# Patient Record
Sex: Male | Born: 1970 | Race: White | Hispanic: No | Marital: Single | Smoking: Former smoker
Health system: Southern US, Community
[De-identification: ages and names within clinical notes are randomized; demographics above are authoritative.]

## PROBLEM LIST (undated history)

## (undated) DIAGNOSIS — M1990 Unspecified osteoarthritis, unspecified site: Secondary | ICD-10-CM

## (undated) DIAGNOSIS — E119 Type 2 diabetes mellitus without complications: Secondary | ICD-10-CM

## (undated) DIAGNOSIS — F419 Anxiety disorder, unspecified: Secondary | ICD-10-CM

## (undated) DIAGNOSIS — M549 Dorsalgia, unspecified: Secondary | ICD-10-CM

## (undated) HISTORY — DX: Anxiety disorder, unspecified: F41.9

## (undated) HISTORY — DX: Type 2 diabetes mellitus without complications: E11.9

## (undated) HISTORY — DX: Unspecified osteoarthritis, unspecified site: M19.90

## (undated) HISTORY — PX: BACK SURGERY: SHX140

## (undated) HISTORY — DX: Dorsalgia, unspecified: M54.9

---

## 2004-08-09 ENCOUNTER — Ambulatory Visit: Payer: Self-pay | Admitting: Pain Medicine

## 2004-08-22 ENCOUNTER — Ambulatory Visit: Payer: Self-pay | Admitting: Pain Medicine

## 2004-09-10 ENCOUNTER — Ambulatory Visit: Payer: Self-pay | Admitting: Pain Medicine

## 2004-09-24 ENCOUNTER — Ambulatory Visit: Payer: Self-pay | Admitting: Pain Medicine

## 2009-07-26 ENCOUNTER — Emergency Department: Payer: Self-pay | Admitting: Emergency Medicine

## 2009-08-11 ENCOUNTER — Ambulatory Visit: Payer: Self-pay

## 2009-09-05 ENCOUNTER — Ambulatory Visit: Payer: Self-pay | Admitting: Pain Medicine

## 2009-10-11 ENCOUNTER — Ambulatory Visit: Payer: Self-pay | Admitting: Physician Assistant

## 2009-10-31 ENCOUNTER — Emergency Department (HOSPITAL_COMMUNITY): Admission: EM | Admit: 2009-10-31 | Discharge: 2009-10-31 | Payer: Self-pay | Admitting: Emergency Medicine

## 2009-11-06 ENCOUNTER — Ambulatory Visit: Payer: Self-pay | Admitting: Pain Medicine

## 2009-12-18 ENCOUNTER — Ambulatory Visit: Payer: Self-pay | Admitting: Pain Medicine

## 2010-01-24 ENCOUNTER — Emergency Department: Payer: Self-pay | Admitting: Emergency Medicine

## 2010-03-16 ENCOUNTER — Ambulatory Visit: Payer: Self-pay | Admitting: Unknown Physician Specialty

## 2010-03-19 ENCOUNTER — Ambulatory Visit: Payer: Self-pay | Admitting: Unknown Physician Specialty

## 2010-03-22 ENCOUNTER — Ambulatory Visit: Payer: Self-pay | Admitting: Unknown Physician Specialty

## 2011-03-27 ENCOUNTER — Ambulatory Visit: Payer: Self-pay | Admitting: Urology

## 2011-09-12 ENCOUNTER — Ambulatory Visit: Payer: Self-pay | Admitting: Cardiovascular Disease

## 2013-02-26 ENCOUNTER — Ambulatory Visit: Payer: Self-pay | Admitting: Gastroenterology

## 2013-03-02 ENCOUNTER — Ambulatory Visit: Payer: Self-pay | Admitting: Gastroenterology

## 2013-03-15 ENCOUNTER — Ambulatory Visit: Payer: Self-pay | Admitting: Gastroenterology

## 2013-03-16 LAB — PATHOLOGY REPORT

## 2013-09-13 ENCOUNTER — Ambulatory Visit: Payer: Self-pay | Admitting: Family Medicine

## 2014-09-24 IMAGING — CR CERVICAL SPINE - 2-3 VIEW
1 series · 1 of 1 positions shown · non-contrast
Comparison: None.

CLINICAL DATA: Cervical pain.

EXAM:
CERVICAL SPINE - 2-3 VIEW

[ap]
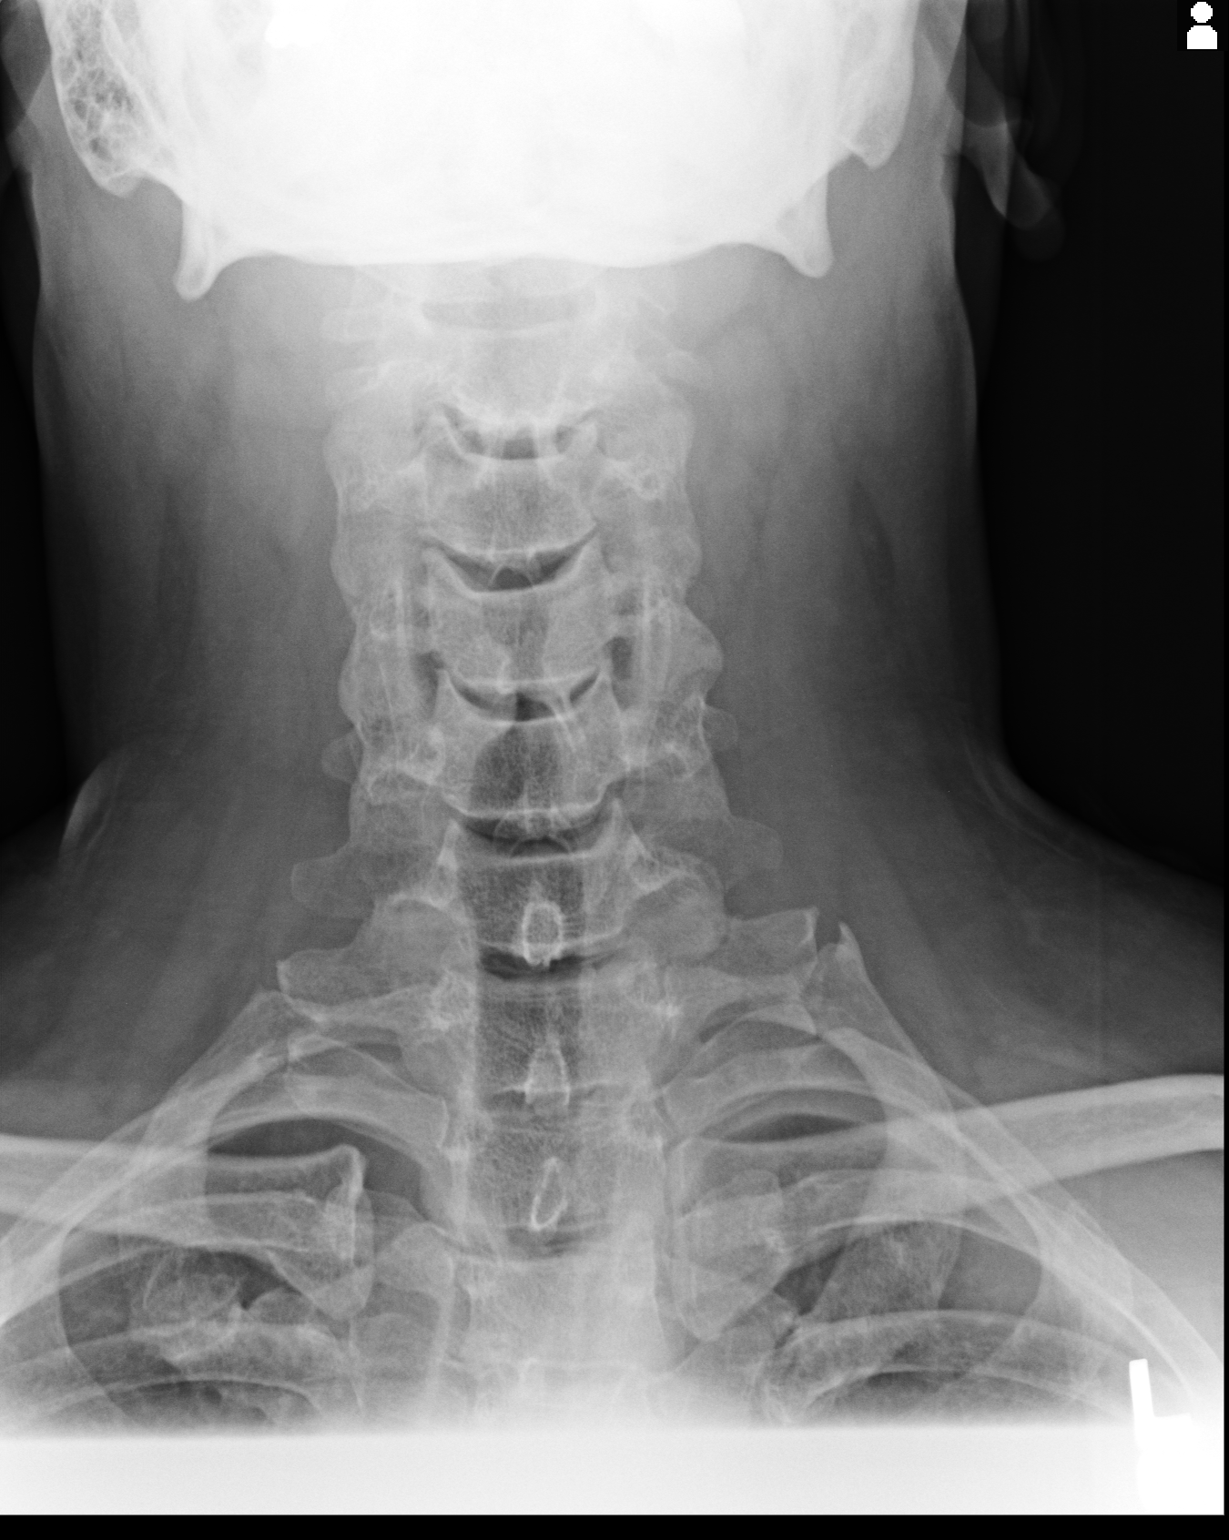

[1 of 1 positions shown; findings below may reference images not displayed]

FINDINGS: The cervical spine is visualized to the level of C6-7..

The vertebral body heights are maintained. The alignment is normal.
The prevertebral soft tissues are normal. There is no acute fracture
or static listhesis. There is degenerative disc disease at C2-3.
IMPRESSION: No acute osseous injury of the cervical spine.

Degenerative disc disease at C2-3.

## 2015-04-24 ENCOUNTER — Ambulatory Visit (INDEPENDENT_AMBULATORY_CARE_PROVIDER_SITE_OTHER): Payer: Self-pay | Admitting: Family Medicine

## 2015-04-24 ENCOUNTER — Encounter: Payer: Self-pay | Admitting: Family Medicine

## 2015-04-24 VITALS — BP 118/77 | HR 76 | Temp 98.1°F | Resp 18 | Ht 68.0 in | Wt 241.8 lb

## 2015-04-24 DIAGNOSIS — R7401 Elevation of levels of liver transaminase levels: Secondary | ICD-10-CM

## 2015-04-24 DIAGNOSIS — E1169 Type 2 diabetes mellitus with other specified complication: Secondary | ICD-10-CM | POA: Insufficient documentation

## 2015-04-24 DIAGNOSIS — A6000 Herpesviral infection of urogenital system, unspecified: Secondary | ICD-10-CM

## 2015-04-24 DIAGNOSIS — R740 Nonspecific elevation of levels of transaminase and lactic acid dehydrogenase [LDH]: Secondary | ICD-10-CM

## 2015-04-24 DIAGNOSIS — E785 Hyperlipidemia, unspecified: Secondary | ICD-10-CM

## 2015-04-24 DIAGNOSIS — E119 Type 2 diabetes mellitus without complications: Secondary | ICD-10-CM

## 2015-04-24 DIAGNOSIS — B001 Herpesviral vesicular dermatitis: Secondary | ICD-10-CM | POA: Insufficient documentation

## 2015-04-24 DIAGNOSIS — A609 Anogenital herpesviral infection, unspecified: Secondary | ICD-10-CM

## 2015-04-24 MED ORDER — LEVEMIR FLEXTOUCH 100 UNIT/ML ~~LOC~~ SOPN: 10.0000 [IU] | PEN_INJECTOR | Freq: Every day | SUBCUTANEOUS | Status: DC

## 2015-04-24 MED ORDER — VALACYCLOVIR HCL 1 G PO TABS: 1000.0000 mg | ORAL_TABLET | Freq: Two times a day (BID) | ORAL | Status: DC

## 2015-04-24 NOTE — Progress Notes (Signed)
Name: Trevor Dennis.   MRN: [id number]    DOB: [birth year]   Date:04/24/2015       Progress Note  Subjective  Chief Complaint  Chief Complaint  Patient presents with  . Follow-up    2 mo  . Diabetes  . Hyperlipidemia  . Hypothyroidism    Diabetes He presents for his follow-up diabetic visit. He has type 2 diabetes mellitus. His disease course has been stable. Hypoglycemia symptoms include nervousness/anxiousness and sweats. Pertinent negatives for diabetic complications include no CVA, heart disease or nephropathy. Risk factors for coronary artery disease include diabetes mellitus. Current diabetic treatment includes intensive insulin program, diet and oral agent (monotherapy). He is following a diabetic and generally healthy diet. His breakfast blood glucose range is generally 110-130 mg/dl. An ACE inhibitor/angiotensin II receptor blocker is not being taken. Eye exam is current.  Hyperlipidemia This is a new diagnosis. Recent lipid tests were reviewed and are high. Exacerbating diseases include diabetes and obesity. Pertinent negatives include no myalgias. He is currently on no antihyperlipidemic treatment. Risk factors for coronary artery disease include dyslipidemia.      Past Medical History  Diagnosis Date  . Anxiety   . Arthritis   . Diabetes mellitus without complication     Past Surgical History  Procedure Laterality Date  . Back surgery  8 years ago    Family History  Problem Relation Age of Onset  . Diabetes Mother   . Liver disease Brother     History   Social History  . Marital Status: Married    Spouse Name: N/A  . Number of Children: N/A  . Years of Education: N/A   Occupational History  . Not on file.   Social History Main Topics  . Smoking status: Current Some Day Smoker -- 1.00 packs/day    Types: Cigarettes  . Smokeless tobacco: Never Used     Comment: social smoker like 1 some days  . Alcohol Use: 0.0 oz/week    0 Standard drinks or  equivalent per week     Comment: Occasional   . Drug Use: No  . Sexual Activity:    Partners: Female   Other Topics Concern  . Not on file   Social History Narrative  . No narrative on file     Current outpatient prescriptions:  .  ALPRAZolam (XANAX) 0.5 MG tablet, Take 0.5 mg by mouth 2 (two) times daily as needed., Disp: , Rfl: 0 .  Blood Glucose Monitoring Suppl (ONE TOUCH ULTRA MINI) W/DEVICE KIT, See admin instructions., Disp: , Rfl: 0 .  esomeprazole (NEXIUM) 40 MG capsule, Take by mouth., Disp: , Rfl:  .  Lancets 30G MISC, , Disp: , Rfl: 12 .  LEVEMIR FLEXTOUCH 100 UNIT/ML Pen, INJECT 10 UNITS SUBQ EVERY NIGHT AT BEDTIME, Disp: , Rfl: 0 .  metFORMIN (GLUCOPHAGE) 1000 MG tablet, Take 1,000 mg by mouth every 12 (twelve) hours., Disp: , Rfl: 2 .  Multiple Vitamin (MULTI VITAMIN DAILY PO), Take by mouth., Disp: , Rfl:  .  NOVOTWIST 32G X 5 MM MISC, See admin instructions., Disp: , Rfl: 0 .  ONE TOUCH ULTRA TEST test strip, USE TO TEST SUGAR 3 TIMES A DAY, Disp: , Rfl: 12  No Known Allergies   Review of Systems  Gastrointestinal: Negative for abdominal pain.  Musculoskeletal: Negative for myalgias, back pain and joint pain.  Psychiatric/Behavioral: The patient is nervous/anxious.       Objective  Filed Vitals:   04/24/15 0840  BP: 118/77  Pulse: 76  Temp: 98.1 F (36.7 C)  TempSrc: Oral  Resp: 18  Height: '5\' 8"'  (1.727 m)  Weight: 241 lb 12.8 oz (109.68 kg)  SpO2: 97%    Physical Exam  Constitutional: He is well-developed, well-nourished, and in no distress.  HENT:  Head: Normocephalic and atraumatic.  Cardiovascular: Normal rate and regular rhythm.   Pulmonary/Chest: Effort normal and breath sounds normal.  Abdominal: Soft. Bowel sounds are normal.  Nursing note and vitals reviewed.      No results found for this or any previous visit (from the past 2160 hour(s)).   Assessment & Plan 1. Diabetes mellitus without complication Recheck Y8X today.  Patient is taking metformin 'as needed' but is injecting the insulin regularly. Dietary and lifestyle therapies have been instituted and fasting BG seems to be improving. - LEVEMIR FLEXTOUCH 100 UNIT/ML Pen; Inject 10 Units into the skin daily at 10 pm.  Dispense: 5 pen; Refill: 0  -HgbA1c  2. HLD (hyperlipidemia)  - Cholesterol, Total  3. Recurrent genital HSV (herpes simplex virus) infection History of exposure to genital herpes with intermittent flareups. Has been on Valtrex in the past and is requesting a refill. Provided prescription for 1000 mg twice daily for acute flareups. - valACYclovir (VALTREX) 1000 MG tablet; Take 1 tablet (1,000 mg total) by mouth 2 (two) times daily.  Dispense: 20 tablet; Refill: 0   Trevor Dennis A. [facility] 04/24/2015 9:06 AM

## 2015-04-25 LAB — HEMOGLOBIN A1C
Est. average glucose Bld gHb Est-mCnc: 157 mg/dL
Hgb A1c MFr Bld: 7.1 % — ABNORMAL HIGH (ref 4.8–5.6)

## 2015-04-25 LAB — CHOLESTEROL, TOTAL: Cholesterol, Total: 172 mg/dL (ref 100–199)

## 2015-04-28 ENCOUNTER — Other Ambulatory Visit: Payer: Self-pay | Admitting: Family Medicine

## 2015-04-28 MED ORDER — NOVOTWIST 32G X 5 MM MISC: 10.0000 [IU] | Status: DC

## 2015-04-28 NOTE — Telephone Encounter (Signed)
Trevor Dennis has been refilled and sent to [facility] [address] [address], Trevor has appointment scheduled for 07/25/2015

## 2015-04-29 LAB — LIPID PANEL W/O CHOL/HDL RATIO
CHOLESTEROL TOTAL: 179 mg/dL (ref 100–199)
HDL: 48 mg/dL (ref >39)
LDL CALC: 100 mg/dL — AB (ref 0–99)
TRIGLYCERIDES: 156 mg/dL — AB (ref 0–149)
VLDL CHOLESTEROL CAL: 31 mg/dL (ref 5–40)

## 2015-05-03 DIAGNOSIS — K760 Fatty (change of) liver, not elsewhere classified: Secondary | ICD-10-CM | POA: Insufficient documentation

## 2015-05-03 NOTE — Addendum Note (Signed)
Addended bySherryll Burger: Yanky Vanderburg A A on: 05/03/2015 06:56 PM   Modules accepted: Orders, SmartSet

## 2015-05-20 LAB — SPECIMEN STATUS REPORT

## 2015-07-17 ENCOUNTER — Telehealth: Payer: Self-pay | Admitting: Family Medicine

## 2015-07-17 DIAGNOSIS — E119 Type 2 diabetes mellitus without complications: Secondary | ICD-10-CM

## 2015-07-17 NOTE — Telephone Encounter (Signed)
Patient only have one dosage of Levimer and is needing a prescription to be called into [facility]. He does have upcoming appointment for 07-25-15

## 2015-07-18 MED ORDER — LEVEMIR FLEXTOUCH 100 UNIT/ML ~~LOC~~ SOPN: 10.0000 [IU] | PEN_INJECTOR | Freq: Every day | SUBCUTANEOUS | Status: DC

## 2015-07-18 NOTE — Telephone Encounter (Signed)
Medication has been refilled and [facility] 

## 2015-07-25 ENCOUNTER — Ambulatory Visit: Payer: Self-pay | Admitting: Family Medicine

## 2015-07-27 ENCOUNTER — Ambulatory Visit (INDEPENDENT_AMBULATORY_CARE_PROVIDER_SITE_OTHER): Payer: Self-pay | Admitting: Family Medicine

## 2015-07-27 ENCOUNTER — Encounter: Payer: Self-pay | Admitting: Family Medicine

## 2015-07-27 VITALS — BP 118/70 | HR 83 | Temp 98.6°F | Resp 18 | Ht 68.0 in | Wt 241.9 lb

## 2015-07-27 DIAGNOSIS — E785 Hyperlipidemia, unspecified: Secondary | ICD-10-CM

## 2015-07-27 DIAGNOSIS — Z794 Long term (current) use of insulin: Secondary | ICD-10-CM

## 2015-07-27 DIAGNOSIS — M549 Dorsalgia, unspecified: Secondary | ICD-10-CM

## 2015-07-27 DIAGNOSIS — E119 Type 2 diabetes mellitus without complications: Secondary | ICD-10-CM

## 2015-07-27 DIAGNOSIS — L989 Disorder of the skin and subcutaneous tissue, unspecified: Secondary | ICD-10-CM | POA: Insufficient documentation

## 2015-07-27 DIAGNOSIS — M545 Low back pain, unspecified: Secondary | ICD-10-CM | POA: Insufficient documentation

## 2015-07-27 DIAGNOSIS — R748 Abnormal levels of other serum enzymes: Secondary | ICD-10-CM

## 2015-07-27 HISTORY — DX: Dorsalgia, unspecified: M54.9

## 2015-07-27 LAB — POCT CBG (FASTING - GLUCOSE)-MANUAL ENTRY: GLUCOSE FASTING, POC: 136 mg/dL — AB (ref 70–99)

## 2015-07-27 MED ORDER — METFORMIN HCL 1000 MG PO TABS: 1000.0000 mg | ORAL_TABLET | Freq: Two times a day (BID) | ORAL | Status: DC

## 2015-07-27 NOTE — Progress Notes (Signed)
Name: Trevor Dennis.   MRN: [id number]    DOB: Feb 25, 1971   Date:07/27/2015       Progress Note  Subjective  Chief Complaint  Chief Complaint  Patient presents with  . Follow-up    3 mo  . Diabetes  . Hyperlipidemia  . Gastrophageal Reflux  . Anxiety    Diabetes He presents for his follow-up diabetic visit. He has type 2 diabetes mellitus. His disease course has been stable. Pertinent negatives for diabetes include no chest pain. Current diabetic treatment includes oral agent (monotherapy) and intensive insulin program (Metformin only 'as needed'). His breakfast blood glucose range is generally 130-140 mg/dl. An ACE inhibitor/angiotensin II receptor blocker is not being taken.  Hyperlipidemia The problem is controlled. Recent lipid tests were reviewed and are high. Exacerbating diseases include diabetes. Pertinent negatives include no chest pain or shortness of breath. He is currently on no antihyperlipidemic treatment (elevated Liver enzymes.).   Past Medical History  Diagnosis Date  . Anxiety   . Arthritis   . Diabetes mellitus without complication [facility])     Past Surgical History  Procedure Laterality Date  . Back surgery  8 years ago    Family History  Problem Relation Age of Onset  . Diabetes Mother   . Liver disease Brother     Social History   Social History  . Marital Status: Married    Spouse Name: N/A  . Number of Children: N/A  . Years of Education: N/A   Occupational History  . Not on file.   Social History Main Topics  . Smoking status: Current Some Day Smoker -- 1.00 packs/day    Types: Cigarettes  . Smokeless tobacco: Never Used     Comment: social smoker like 1 some days  . Alcohol Use: 0.0 oz/week    0 Standard drinks or equivalent per week     Comment: Occasional   . Drug Use: No  . Sexual Activity:    Partners: Female   Other Topics Concern  . Not on file   Social History Narrative    Current outpatient prescriptions:  .   ALPRAZolam (XANAX) 0.5 MG tablet, Take 0.5 mg by mouth 2 (two) times daily as needed., Disp: , Rfl: 0 .  Blood Glucose Monitoring Suppl (ONE TOUCH ULTRA MINI) W/DEVICE KIT, See admin instructions., Disp: , Rfl: 0 .  esomeprazole (NEXIUM) 40 MG capsule, Take by mouth., Disp: , Rfl:  .  Lancets 30G MISC, , Disp: , Rfl: 12 .  LEVEMIR FLEXTOUCH 100 UNIT/ML Pen, Inject 10 Units into the skin daily at 10 pm., Disp: 5 pen, Rfl: 0 .  metFORMIN (GLUCOPHAGE) 1000 MG tablet, Take 1,000 mg by mouth every 12 (twelve) hours., Disp: , Rfl: 2 .  Multiple Vitamin (MULTI VITAMIN DAILY PO), Take by mouth., Disp: , Rfl:  .  NOVOTWIST 32G X 5 MM MISC, Inject 10 Units into the skin See admin instructions., Disp: 100 each, Rfl: 0 .  ONE TOUCH ULTRA TEST test strip, USE TO TEST SUGAR 3 TIMES A DAY, Disp: , Rfl: 12 .  valACYclovir (VALTREX) 1000 MG tablet, Take 1 tablet (1,000 mg total) by mouth 2 (two) times daily., Disp: 20 tablet, Rfl: 0  No Known Allergies  Review of Systems  Constitutional: Positive for malaise/fatigue. Negative for fever and chills.  Respiratory: Negative for shortness of breath.   Cardiovascular: Negative for chest pain.  Gastrointestinal: Negative for abdominal pain, diarrhea and blood in stool.    Objective  Filed Vitals:   07/27/15 0833  BP: 118/70  Pulse: 83  Temp: 98.6 F (37 C)  TempSrc: Oral  Resp: 18  Height: _0  (1.727 m)  Weight: 241 lb 14.4 oz (109.725 kg)  SpO2: 96%    Physical Exam  Constitutional: He is oriented to person, place, and time and well-developed, well-nourished, and in no distress.  HENT:  Head: Normocephalic and atraumatic.  Cardiovascular: Normal rate, regular rhythm and normal heart sounds.   No murmur heard. Pulmonary/Chest: Effort normal and breath sounds normal. He has no wheezes.  Abdominal: Soft. Bowel sounds are normal. There is no tenderness.  Neurological: He is alert and oriented to person, place, and time.  Skin: Skin is warm and  dry.     Raised, flesh-colored smooth lesion over the right lower back.  Nursing note and vitals reviewed.   Assessment & Plan  1. Controlled type 2 diabetes mellitus with insulin therapy ([place]) Advised patient to take metformin regularly as prescribed. Obtain A1c and consider adjustment of insulin dosage. - metFORMIN (GLUCOPHAGE) 1000 MG tablet; Take 1 tablet (1,000 mg total) by mouth 2 (two) times daily with a meal.  Dispense: 180 tablet; Refill: 0 - POCT CBG (Fasting - Glucose) - Urine Microalbumin w/creat. ratio - HgB A1c  2. Elevated liver enzymes Repeat liver enzymes and follow-up. - Basic Metabolic Panel (BMET) - Hepatic function panel  3. Skin lesion of back  - Ambulatory referral to Dermatology  4. HLD (hyperlipidemia)  - Lipid Profile  Carl Butner Asad [facility] 07/27/2015 8:54 AM

## 2015-07-28 LAB — BASIC METABOLIC PANEL
BUN/Creatinine Ratio: 23 — ABNORMAL HIGH (ref 9–20)
BUN: 22 mg/dL (ref 6–24)
CO2: 25 mmol/L (ref 18–29)
Calcium: 9.9 mg/dL (ref 8.7–10.2)
Chloride: 100 mmol/L (ref 97–108)
Creatinine, Ser: 0.94 mg/dL (ref 0.76–1.27)
GFR calc Af Amer: 115 mL/min/{1.73_m2} (ref >59)
GFR calc non Af Amer: 100 mL/min/{1.73_m2} (ref >59)
Glucose: 122 mg/dL — ABNORMAL HIGH (ref 65–99)
Potassium: 5.2 mmol/L (ref 3.5–5.2)
Sodium: 140 mmol/L (ref 134–144)

## 2015-07-28 LAB — HEPATIC FUNCTION PANEL
ALT: 143 IU/L — AB (ref 0–44)
AST: 90 IU/L — AB (ref 0–40)
Albumin: 4.9 g/dL (ref 3.5–5.5)
Alkaline Phosphatase: 57 IU/L (ref 39–117)
BILIRUBIN TOTAL: 0.7 mg/dL (ref 0.0–1.2)
Bilirubin, Direct: 0.2 mg/dL (ref 0.00–0.40)
Total Protein: 7.8 g/dL (ref 6.0–8.5)

## 2015-07-28 LAB — MICROALBUMIN / CREATININE URINE RATIO
Creatinine, Urine: 159.6 mg/dL
MICROALB/CREAT RATIO: 8.2 mg/g creat (ref 0.0–30.0)
Microalbumin, Urine: 13.1 ug/mL

## 2015-07-28 LAB — LIPID PANEL
CHOL/HDL RATIO: 3.5 ratio (ref 0.0–5.0)
CHOLESTEROL TOTAL: 188 mg/dL (ref 100–199)
HDL: 54 mg/dL (ref >39)
LDL CALC: 118 mg/dL — AB (ref 0–99)
TRIGLYCERIDES: 82 mg/dL (ref 0–149)
VLDL Cholesterol Cal: 16 mg/dL (ref 5–40)

## 2015-07-28 LAB — HEMOGLOBIN A1C
Est. average glucose Bld gHb Est-mCnc: 157 mg/dL
HEMOGLOBIN A1C: 7.1 % — AB (ref 4.8–5.6)

## 2015-09-05 ENCOUNTER — Telehealth: Payer: Self-pay | Admitting: Family Medicine

## 2015-09-05 NOTE — Telephone Encounter (Signed)
Spoke with Misty StanleyLisa from [facility] and notified her that patient has not had this medication refilled since 02/16/2015 and he would need to schedule an appointment with Dr. Sherryll BurgerShah before this refilling this medication

## 2015-09-05 NOTE — Telephone Encounter (Signed)
Pt would like refill on Alprazolam.  °

## 2015-09-05 NOTE — Telephone Encounter (Signed)
Patient will need an office visit appointment to discuss medication refills

## 2015-09-05 NOTE — Telephone Encounter (Signed)
Routed to Dr. Sherryll BurgerShah for approval, patient has been notified that he needs to schedule an appointment due to medication has not been refilled since 02/16/2015.

## 2015-09-05 NOTE — Telephone Encounter (Signed)
[facility]ill on Alprazolam .05mg  #60 last filled on 02-16-15

## 2015-09-22 ENCOUNTER — Telehealth: Payer: Self-pay

## 2015-09-22 MED ORDER — NOVOTWIST 32G X 5 MM MISC: 10.0000 [IU] | Status: DC

## 2015-09-22 NOTE — Telephone Encounter (Signed)
[facility] Needle has been refilled and [facility]

## 2015-10-16 ENCOUNTER — Other Ambulatory Visit: Payer: Self-pay | Admitting: Family Medicine

## 2015-10-27 ENCOUNTER — Ambulatory Visit: Payer: Self-pay | Admitting: Family Medicine

## 2015-11-01 ENCOUNTER — Other Ambulatory Visit: Payer: Self-pay | Admitting: Family Medicine

## 2015-11-20 ENCOUNTER — Ambulatory Visit: Payer: Self-pay | Admitting: Family Medicine

## 2015-11-22 ENCOUNTER — Encounter: Payer: Self-pay | Admitting: Family Medicine

## 2015-11-22 ENCOUNTER — Ambulatory Visit (INDEPENDENT_AMBULATORY_CARE_PROVIDER_SITE_OTHER): Payer: BLUE CROSS/BLUE SHIELD | Admitting: Family Medicine

## 2015-11-22 VITALS — BP 118/68 | HR 82 | Temp 98.4°F | Resp 19 | Ht 68.0 in | Wt 249.6 lb

## 2015-11-22 DIAGNOSIS — E119 Type 2 diabetes mellitus without complications: Secondary | ICD-10-CM

## 2015-11-22 DIAGNOSIS — L309 Dermatitis, unspecified: Secondary | ICD-10-CM | POA: Insufficient documentation

## 2015-11-22 DIAGNOSIS — R7989 Other specified abnormal findings of blood chemistry: Secondary | ICD-10-CM

## 2015-11-22 DIAGNOSIS — E785 Hyperlipidemia, unspecified: Secondary | ICD-10-CM

## 2015-11-22 DIAGNOSIS — Z794 Long term (current) use of insulin: Secondary | ICD-10-CM | POA: Diagnosis not present

## 2015-11-22 DIAGNOSIS — R748 Abnormal levels of other serum enzymes: Secondary | ICD-10-CM

## 2015-11-22 DIAGNOSIS — E291 Testicular hypofunction: Secondary | ICD-10-CM

## 2015-11-22 LAB — POCT CBG (FASTING - GLUCOSE)-MANUAL ENTRY: GLUCOSE FASTING, POC: 131 mg/dL — AB (ref 70–99)

## 2015-11-22 LAB — POCT GLYCOSYLATED HEMOGLOBIN (HGB A1C): Hemoglobin A1C: 6.8

## 2015-11-22 MED ORDER — KETOCONAZOLE 2 % EX CREA: 1.0000 "application " | TOPICAL_CREAM | Freq: Every day | CUTANEOUS | Status: DC

## 2015-11-22 NOTE — Progress Notes (Signed)
Name: Trevor Dennis.   MRN: [id number]    DOB: 01-20-1971   Date:11/22/2015       Progress Note  Subjective  Chief Complaint  Chief Complaint  Patient presents with  . Follow-up    3 mo  . Diabetes  . Hyperlipidemia  . Medication Refill    Xanax    Diabetes He presents for his follow-up diabetic visit. He has type 2 diabetes mellitus. His disease course has been stable. There are no hypoglycemic associated symptoms. Pertinent negatives for diabetes include no chest pain, no fatigue, no foot paresthesias, no polydipsia and no polyuria. Symptoms are stable. Current diabetic treatment includes oral agent (monotherapy) and insulin injections. His breakfast blood glucose range is generally 130-140 mg/dl.  Hyperlipidemia The problem is uncontrolled. Recent lipid tests were reviewed and are high (Elevated LDL). Exacerbating diseases include diabetes and obesity. Pertinent negatives include no chest pain. He is currently on no antihyperlipidemic treatment (Not on statin therapy 2/2 elevated liver enzymes.).   Low Testosterone Pt. With reported hx of low testosterone in the past (6-7 yrs ago). He was on testosterone pellets treatment and used to see a Dealer in [place], [place]. He has been off any testosterone replacement therapy due to lack of insurance. He wants his Testosterone levels repeated.   Past Medical History  Diagnosis Date  . Anxiety   . Arthritis   . Diabetes mellitus without complication [facility])     Past Surgical History  Procedure Laterality Date  . Back surgery  8 years ago    Family History  Problem Relation Age of Onset  . Diabetes Mother   . Liver disease Brother     Social History   Social History  . Marital Status: Married    Spouse Name: N/A  . Number of Children: N/A  . Years of Education: N/A   Occupational History  . Not on file.   Social History Main Topics  . Smoking status: Current Some Day Smoker -- 1.00 packs/day    Types: Cigarettes  .  Smokeless tobacco: Never Used     Comment: social smoker like 1 some days  . Alcohol Use: 0.0 oz/week    0 Standard drinks or equivalent per week     Comment: Occasional   . Drug Use: No  . Sexual Activity:    Partners: Female   Other Topics Concern  . Not on file   Social History Narrative    Current outpatient prescriptions:  .  ALPRAZolam (XANAX) 0.5 MG tablet, Take 0.5 mg by mouth 2 (two) times daily as needed., Disp: , Rfl: 0 .  Blood Glucose Monitoring Suppl (ONE TOUCH ULTRA MINI) W/DEVICE KIT, See admin instructions., Disp: , Rfl: 0 .  esomeprazole (NEXIUM) 40 MG capsule, Take by mouth., Disp: , Rfl:  .  Lancets 30G MISC, , Disp: , Rfl: 12 .  LEVEMIR FLEXTOUCH 100 UNIT/ML Pen, INJECT 10 UNITS INTO THE SKIN DAILY AT 10 PM, Disp: 1 pen, Rfl: 3 .  metFORMIN (GLUCOPHAGE) 1000 MG tablet, TAKE 1 TABLET (1,000 MG TOTAL) BY MOUTH 2 (TWO) TIMES DAILY WITH A MEAL., Disp: 180 tablet, Rfl: 0 .  Multiple Vitamin (MULTI VITAMIN DAILY PO), Take by mouth., Disp: , Rfl:  .  NOVOTWIST 32G X 5 MM MISC, Inject 10 Units into the skin See admin instructions., Disp: 100 each, Rfl: 0 .  ONE TOUCH ULTRA TEST test strip, USE TO TEST SUGAR 3 TIMES A DAY, Disp: , Rfl: 12 .  valACYclovir (  VALTREX) 1000 MG tablet, Take 1 tablet (1,000 mg total) by mouth 2 (two) times daily., Disp: 20 tablet, Rfl: 0  No Known Allergies   Review of Systems  Constitutional: Negative for fever, chills, malaise/fatigue and fatigue.  Cardiovascular: Negative for chest pain and palpitations.  Gastrointestinal: Negative for nausea, vomiting and abdominal pain.  Endo/Heme/Allergies: Negative for polydipsia.     Objective  Filed Vitals:   11/22/15 0854  BP: 118/68  Pulse: 82  Temp: 98.4 F (36.9 C)  TempSrc: Oral  Resp: 19  Height: _0  (1.727 m)  Weight: 249 lb 9.6 oz (113.218 kg)  SpO2: 95%    Physical Exam  Constitutional: He is oriented to person, place, and time and well-developed, well-nourished, and in  no distress.  HENT:  Head: Normocephalic and atraumatic.  Cardiovascular: Normal rate and regular rhythm.   Pulmonary/Chest: Effort normal and breath sounds normal.  Abdominal: Soft. Bowel sounds are normal.  Neurological: He is alert and oriented to person, place, and time.  Skin: Rash noted. Rash is maculopapular and vesicular.     Erythematous, maculo-papular rash on the right lower abdomen at the skin fold, closed vesicles also visualized.  Nursing note and vitals reviewed.     Assessment & Plan  1. Controlled type 2 diabetes mellitus with insulin therapy (HCC) A1c now below 7%, at goal. Continue on metformin and basal insulin. - POCT HgB A1C - POCT CBG (Fasting - Glucose)  2. Elevated liver enzymes Repeat levels today. If persistently elevated, will need further workup - Comprehensive Metabolic Panel (CMET)  3. HLD (hyperlipidemia)  - Lipid Profile  4. Dermatitis Likely fungal in etiology. We'll start on topical antifungal. - ketoconazole (NIZORAL) 2 % cream; Apply 1 application topically daily.  Dispense: 30 g; Refill: 0  5. Low testosterone In the past, has been on replacement therapy. Obtain levels today. - Testosterone   Isaly Fasching Asad A[facility] 11/22/2015 9:02 AM

## 2015-11-23 ENCOUNTER — Other Ambulatory Visit: Payer: Self-pay

## 2015-11-23 LAB — COMPREHENSIVE METABOLIC PANEL
ALBUMIN: 4.7 g/dL (ref 3.5–5.5)
ALK PHOS: 56 IU/L (ref 39–117)
ALT: 135 IU/L — ABNORMAL HIGH (ref 0–44)
AST: 80 IU/L — ABNORMAL HIGH (ref 0–40)
Albumin/Globulin Ratio: 1.6 (ref 1.1–2.5)
BUN / CREAT RATIO: 15 (ref 9–20)
BUN: 14 mg/dL (ref 6–24)
Bilirubin Total: 0.6 mg/dL (ref 0.0–1.2)
CO2: 23 mmol/L (ref 18–29)
CREATININE: 0.92 mg/dL (ref 0.76–1.27)
Calcium: 9.9 mg/dL (ref 8.7–10.2)
Chloride: 101 mmol/L (ref 96–106)
GFR calc non Af Amer: 102 mL/min/{1.73_m2} (ref >59)
GFR, EST AFRICAN AMERICAN: 117 mL/min/{1.73_m2} (ref >59)
GLOBULIN, TOTAL: 2.9 g/dL (ref 1.5–4.5)
GLUCOSE: 112 mg/dL — AB (ref 65–99)
Potassium: 5.5 mmol/L — ABNORMAL HIGH (ref 3.5–5.2)
SODIUM: 141 mmol/L (ref 134–144)
TOTAL PROTEIN: 7.6 g/dL (ref 6.0–8.5)

## 2015-11-23 LAB — TESTOSTERONE: TESTOSTERONE: 188 ng/dL — AB (ref 348–1197)

## 2015-11-23 LAB — LIPID PANEL
CHOL/HDL RATIO: 3.2 ratio (ref 0.0–5.0)
CHOLESTEROL TOTAL: 185 mg/dL (ref 100–199)
HDL: 58 mg/dL (ref >39)
LDL CALC: 106 mg/dL — AB (ref 0–99)
Triglycerides: 106 mg/dL (ref 0–149)
VLDL CHOLESTEROL CAL: 21 mg/dL (ref 5–40)

## 2015-11-23 NOTE — Telephone Encounter (Signed)
Routed to Dr. Shah for approval 

## 2015-12-05 ENCOUNTER — Telehealth: Payer: Self-pay | Admitting: Family Medicine

## 2015-12-05 NOTE — Telephone Encounter (Signed)
Please schedule patient for an appointment to discuss his symptoms and medication refill

## 2015-12-05 NOTE — Telephone Encounter (Signed)
Patient is completely out of Xanex. He was last seen on 11-22-15. Please refill

## 2015-12-05 NOTE — Telephone Encounter (Signed)
Routed to Dr. Shah for approval 

## 2016-01-01 ENCOUNTER — Encounter: Payer: Self-pay | Admitting: Family Medicine

## 2016-01-01 ENCOUNTER — Ambulatory Visit (INDEPENDENT_AMBULATORY_CARE_PROVIDER_SITE_OTHER): Payer: BLUE CROSS/BLUE SHIELD | Admitting: Family Medicine

## 2016-01-01 VITALS — BP 128/81 | HR 107 | Temp 98.3°F | Resp 18 | Ht 68.0 in | Wt 243.7 lb

## 2016-01-01 DIAGNOSIS — F419 Anxiety disorder, unspecified: Secondary | ICD-10-CM

## 2016-01-01 DIAGNOSIS — F430 Acute stress reaction: Principal | ICD-10-CM

## 2016-01-01 DIAGNOSIS — F411 Generalized anxiety disorder: Secondary | ICD-10-CM

## 2016-01-01 MED ORDER — ALPRAZOLAM 0.5 MG PO TABS: 0.5000 mg | ORAL_TABLET | Freq: Two times a day (BID) | ORAL | Status: DC | PRN

## 2016-01-01 NOTE — Progress Notes (Signed)
Name: Trevor Dennis.   MRN: [id number]    DOB: Sep 04, 1971   Date:01/01/2016       Progress Note  Subjective  Chief Complaint  Chief Complaint  Patient presents with  . Medication Refill    xanax 5 mg     HPI  Anxiety: Battling multiple stressors at this time. His dad recently had a stroke and he's taking care of him. Also found out his wife had been cheating on him. He describes himself as being on an 'emotional roller-coaster'. Has been taking Xanax 0.35m twice daily as needed and ran out. Here or request refill.  Past Medical History  Diagnosis Date  . Anxiety   . Arthritis   . Diabetes mellitus without complication ([facility]     Past Surgical History  Procedure Laterality Date  . Back surgery  8 years ago    Family History  Problem Relation Age of Onset  . Diabetes Mother   . Liver disease Brother     Social History   Social History  . Marital Status: Married    Spouse Name: N/A  . Number of Children: N/A  . Years of Education: N/A   Occupational History  . Not on file.   Social History Main Topics  . Smoking status: Current Some Day Smoker -- 1.00 packs/day    Types: Cigarettes  . Smokeless tobacco: Never Used     Comment: social smoker like 1 some days  . Alcohol Use: 0.0 oz/week    0 Standard drinks or equivalent per week     Comment: Occasional   . Drug Use: No  . Sexual Activity:    Partners: Female   Other Topics Concern  . Not on file   Social History Narrative     Current outpatient prescriptions:  .  ALPRAZolam (XANAX) 0.5 MG tablet, Take 0.5 mg by mouth 2 (two) times daily as needed., Disp: , Rfl: 0 .  Blood Glucose Monitoring Suppl (ONE TOUCH ULTRA MINI) W/DEVICE KIT, See admin instructions., Disp: , Rfl: 0 .  esomeprazole (NEXIUM) 40 MG capsule, Take by mouth., Disp: , Rfl:  .  ketoconazole (NIZORAL) 2 % cream, Apply 1 application topically daily., Disp: 30 g, Rfl: 0 .  Lancets 30G MISC, , Disp: , Rfl: 12 .  LEVEMIR FLEXTOUCH 100  UNIT/ML Pen, INJECT 10 UNITS INTO THE SKIN DAILY AT 10 PM, Disp: 1 pen, Rfl: 3 .  metFORMIN (GLUCOPHAGE) 1000 MG tablet, TAKE 1 TABLET (1,000 MG TOTAL) BY MOUTH 2 (TWO) TIMES DAILY WITH A MEAL., Disp: 180 tablet, Rfl: 0 .  Multiple Vitamin (MULTI VITAMIN DAILY PO), Take by mouth., Disp: , Rfl:  .  NOVOTWIST 32G X 5 MM MISC, Inject 10 Units into the skin See admin instructions., Disp: 100 each, Rfl: 0 .  ONE TOUCH ULTRA TEST test strip, USE TO TEST SUGAR 3 TIMES A DAY, Disp: , Rfl: 12 .  valACYclovir (VALTREX) 1000 MG tablet, Take 1 tablet (1,000 mg total) by mouth 2 (two) times daily., Disp: 20 tablet, Rfl: 0  No Known Allergies   Review of Systems  Psychiatric/Behavioral: The patient is nervous/anxious.     Objective  Filed Vitals:   01/01/16 1431  BP: 128/81  Pulse: 107  Temp: 98.3 F (36.8 C)  TempSrc: Oral  Resp: 18  Height: '5\' 8"'  (1.727 m)  Weight: 243 lb 11.2 oz (110.542 kg)  SpO2: 96%    Physical Exam  Constitutional: He is oriented to person, place, and time  and well-developed, well-nourished, and in no distress.  HENT:  Head: Normocephalic and atraumatic.  Neurological: He is alert and oriented to person, place, and time.  Psychiatric: Memory, affect and judgment normal. He exhibits a depressed mood.  Tearful during the interview and exam while describing his marital situation  Nursing note and vitals reviewed.     Assessment & Plan  1. Anxiety in acute stress reaction  Refill provided for alprazolam, patient advised to take medication as directed and return in one month for follow-up.  - ALPRAZolam (XANAX) 0.5 MG tablet; Take 1 tablet (0.5 mg total) by mouth 2 (two) times daily as needed.  Dispense: 60 tablet; Refill: 0   Eastin Swing Asad A[facility] 01/01/2016 2:42 PM

## 2016-01-24 ENCOUNTER — Encounter: Payer: Self-pay | Admitting: Family Medicine

## 2016-01-24 ENCOUNTER — Ambulatory Visit (INDEPENDENT_AMBULATORY_CARE_PROVIDER_SITE_OTHER): Payer: BLUE CROSS/BLUE SHIELD | Admitting: Family Medicine

## 2016-01-24 VITALS — BP 130/74 | HR 84 | Temp 98.8°F | Resp 18 | Ht 68.0 in | Wt 239.9 lb

## 2016-01-24 DIAGNOSIS — F411 Generalized anxiety disorder: Secondary | ICD-10-CM

## 2016-01-24 DIAGNOSIS — F419 Anxiety disorder, unspecified: Secondary | ICD-10-CM | POA: Diagnosis not present

## 2016-01-24 DIAGNOSIS — F430 Acute stress reaction: Principal | ICD-10-CM

## 2016-01-24 MED ORDER — ALPRAZOLAM 0.5 MG PO TABS
0.5000 mg | ORAL_TABLET | Freq: Two times a day (BID) | ORAL | Status: DC | PRN
Start: 2016-01-24 — End: 2016-03-21

## 2016-01-24 MED ORDER — ALPRAZOLAM 0.5 MG PO TABS: 0.5000 mg | ORAL_TABLET | Freq: Two times a day (BID) | ORAL | Status: DC | PRN

## 2016-01-24 NOTE — Progress Notes (Signed)
Name: Trevor Dennis.   MRN: [id number]    DOB: 05-Mar-1971   Date:01/24/2016       Progress Note  Subjective  Chief Complaint  Chief Complaint  Patient presents with  . Follow-up    1 mo    HPI  Anxiety: Pt. Is here for refill of Alprazolam 0.5 mg twice daily as needed. His wife recently was found to be cheating on him and has since moved out. He is trying to move his father (who recently had a stroke) into an apartment. Alprazolam has helped him deal with the stress and anxiety in last few weeks. No side effects.   Past Medical History  Diagnosis Date  . Anxiety   . Arthritis   . Diabetes mellitus without complication [facility])     Past Surgical History  Procedure Laterality Date  . Back surgery  8 years ago    Family History  Problem Relation Age of Onset  . Diabetes Mother   . Liver disease Brother     Social History   Social History  . Marital Status: Married    Spouse Name: N/A  . Number of Children: N/A  . Years of Education: N/A   Occupational History  . Not on file.   Social History Main Topics  . Smoking status: Current Some Day Smoker -- 1.00 packs/day    Types: Cigarettes  . Smokeless tobacco: Never Used     Comment: social smoker like 1 some days  . Alcohol Use: 0.0 oz/week    0 Standard drinks or equivalent per week     Comment: Occasional   . Drug Use: No  . Sexual Activity:    Partners: Female   Other Topics Concern  . Not on file   Social History Narrative     Current outpatient prescriptions:  .  ALPRAZolam (XANAX) 0.5 MG tablet, Take 1 tablet (0.5 mg total) by mouth 2 (two) times daily as needed., Disp: 60 tablet, Rfl: 0 .  Blood Glucose Monitoring Suppl (ONE TOUCH ULTRA MINI) W/DEVICE KIT, See admin instructions., Disp: , Rfl: 0 .  esomeprazole (NEXIUM) 40 MG capsule, Take by mouth., Disp: , Rfl:  .  ketoconazole (NIZORAL) 2 % cream, Apply 1 application topically daily., Disp: 30 g, Rfl: 0 .  Lancets 30G MISC, , Disp: , Rfl:  12 .  LEVEMIR FLEXTOUCH 100 UNIT/ML Pen, INJECT 10 UNITS INTO THE SKIN DAILY AT 10 PM, Disp: 1 pen, Rfl: 3 .  metFORMIN (GLUCOPHAGE) 1000 MG tablet, TAKE 1 TABLET (1,000 MG TOTAL) BY MOUTH 2 (TWO) TIMES DAILY WITH A MEAL., Disp: 180 tablet, Rfl: 0 .  Multiple Vitamin (MULTI VITAMIN DAILY PO), Take by mouth., Disp: , Rfl:  .  NOVOTWIST 32G X 5 MM MISC, Inject 10 Units into the skin See admin instructions., Disp: 100 each, Rfl: 0 .  ONE TOUCH ULTRA TEST test strip, USE TO TEST SUGAR 3 TIMES A DAY, Disp: , Rfl: 12 .  valACYclovir (VALTREX) 1000 MG tablet, Take 1 tablet (1,000 mg total) by mouth 2 (two) times daily., Disp: 20 tablet, Rfl: 0  No Known Allergies   Review of Systems  Psychiatric/Behavioral: Negative for depression. The patient is nervous/anxious. The patient does not have insomnia.       Objective  Filed Vitals:   01/24/16 1449  BP: 130/74  Pulse: 84  Temp: 98.8 F (37.1 C)  TempSrc: Oral  Resp: 18  Height: '5\' 8"'  (1.727 m)  Weight: 239 lb 14.4 oz (  108.818 kg)  SpO2: 96%    Physical Exam  Constitutional: He is oriented to person, place, and time and well-developed, well-nourished, and in no distress.  Neurological: He is alert and oriented to person, place, and time.  Psychiatric: Mood, memory, affect and judgment normal.  Nursing note and vitals reviewed.   Assessment & Plan  1. Anxiety in acute stress reaction Improved, advised to continue taking as needed. Refills provided. - ALPRAZolam (XANAX) 0.5 MG tablet; Take 1 tablet (0.5 mg total) by mouth 2 (two) times daily as needed.  Dispense: 60 tablet; Refill: 0   Tamitha Norell Asad A. [facility] 01/24/2016 2:58 PM

## 2016-02-20 ENCOUNTER — Ambulatory Visit (INDEPENDENT_AMBULATORY_CARE_PROVIDER_SITE_OTHER): Payer: BLUE CROSS/BLUE SHIELD | Admitting: Family Medicine

## 2016-02-20 ENCOUNTER — Encounter: Payer: Self-pay | Admitting: Family Medicine

## 2016-02-20 VITALS — BP 130/78 | HR 74 | Temp 98.2°F | Resp 15 | Ht 68.0 in | Wt 239.8 lb

## 2016-02-20 DIAGNOSIS — E119 Type 2 diabetes mellitus without complications: Secondary | ICD-10-CM

## 2016-02-20 DIAGNOSIS — N529 Male erectile dysfunction, unspecified: Secondary | ICD-10-CM | POA: Insufficient documentation

## 2016-02-20 DIAGNOSIS — N528 Other male erectile dysfunction: Secondary | ICD-10-CM

## 2016-02-20 LAB — GLUCOSE, POCT (MANUAL RESULT ENTRY): POC Glucose: 135 mg/dl — AB (ref 70–99)

## 2016-02-20 LAB — POCT GLYCOSYLATED HEMOGLOBIN (HGB A1C): HEMOGLOBIN A1C: 6.4

## 2016-02-20 MED ORDER — SILDENAFIL CITRATE 50 MG PO TABS: 50.0000 mg | ORAL_TABLET | Freq: Every day | ORAL | Status: DC | PRN

## 2016-02-20 NOTE — Progress Notes (Signed)
Name: Trevor Dennis.   MRN: [id number]    DOB: May 31, 1971   Date:02/20/2016       Progress Note  Subjective  Chief Complaint  Chief Complaint  Patient presents with  . Follow-up    3 mo  . Diabetes    Diabetes He presents for his follow-up diabetic visit. He has type 2 diabetes mellitus. His disease course has been stable. Hypoglycemia symptoms include nervousness/anxiousness. Pertinent negatives for diabetes include no fatigue, no polydipsia and no polyuria. Current diabetic treatment includes diet and oral agent (monotherapy) (Has stopped taking Insulin for over a month.). He is following a diabetic diet. His breakfast blood glucose range is generally 130-140 mg/dl.  Erectile Dysfunction This is a chronic problem. The nature of his difficulty is maintaining erection. Non-physiologic factors contributing to erectile dysfunction are anxiety and a decreased libido. Irritative symptoms do not include frequency. Obstructive symptoms do not include a slower stream. Pertinent negatives include no chills or hesitancy. Past treatments include sildenafil and vardenafil (In the past, has tried Cialis and Viagra.). The treatment provided significant relief. He has had no adverse reactions caused by medications.    Past Medical History  Diagnosis Date  . Anxiety   . Arthritis   . Diabetes mellitus without complication [facility])     Past Surgical History  Procedure Laterality Date  . Back surgery  8 years ago    Family History  Problem Relation Age of Onset  . Diabetes Mother   . Liver disease Brother     Social History   Social History  . Marital Status: Married    Spouse Name: N/A  . Number of Children: N/A  . Years of Education: N/A   Occupational History  . Not on file.   Social History Main Topics  . Smoking status: Current Some Day Smoker -- 1.00 packs/day    Types: Cigarettes  . Smokeless tobacco: Never Used     Comment: social smoker like 1 some days  . Alcohol Use:  0.0 oz/week    0 Standard drinks or equivalent per week     Comment: Occasional   . Drug Use: No  . Sexual Activity:    Partners: Female   Other Topics Concern  . Not on file   Social History Narrative     Current outpatient prescriptions:  .  ALPRAZolam (XANAX) 0.5 MG tablet, Take 1 tablet (0.5 mg total) by mouth 2 (two) times daily as needed., Disp: 60 tablet, Rfl: 0 .  Blood Glucose Monitoring Suppl (ONE TOUCH ULTRA MINI) W/DEVICE KIT, See admin instructions., Disp: , Rfl: 0 .  esomeprazole (NEXIUM) 40 MG capsule, Take by mouth., Disp: , Rfl:  .  ketoconazole (NIZORAL) 2 % cream, Apply 1 application topically daily., Disp: 30 g, Rfl: 0 .  Lancets 30G MISC, , Disp: , Rfl: 12 .  LEVEMIR FLEXTOUCH 100 UNIT/ML Pen, INJECT 10 UNITS INTO THE SKIN DAILY AT 10 PM, Disp: 1 pen, Rfl: 3 .  metFORMIN (GLUCOPHAGE) 1000 MG tablet, TAKE 1 TABLET (1,000 MG TOTAL) BY MOUTH 2 (TWO) TIMES DAILY WITH A MEAL., Disp: 180 tablet, Rfl: 0 .  Multiple Vitamin (MULTI VITAMIN DAILY PO), Take by mouth., Disp: , Rfl:  .  NOVOTWIST 32G X 5 MM MISC, Inject 10 Units into the skin See admin instructions., Disp: 100 each, Rfl: 0 .  ONE TOUCH ULTRA TEST test strip, USE TO TEST SUGAR 3 TIMES A DAY, Disp: , Rfl: 12 .  valACYclovir (VALTREX) 1000 MG  tablet, Take 1 tablet (1,000 mg total) by mouth 2 (two) times daily., Disp: 20 tablet, Rfl: 0  No Known Allergies   Review of Systems  Constitutional: Negative for chills, malaise/fatigue and fatigue.  Genitourinary: Positive for decreased libido. Negative for hesitancy and frequency.  Endo/Heme/Allergies: Negative for polydipsia.  Psychiatric/Behavioral: The patient is nervous/anxious.     Objective  Filed Vitals:   02/20/16 0840  BP: 130/78  Pulse: 74  Temp: 98.2 F (36.8 C)  TempSrc: Oral  Resp: 15  Height: _0  (1.727 m)  Weight: 239 lb 12.8 oz (108.773 kg)  SpO2: 96%    Physical Exam  Constitutional: He is oriented to person, place, and time and  well-developed, well-nourished, and in no distress.  HENT:  Head: Normocephalic and atraumatic.  Cardiovascular: Normal rate and regular rhythm.   Pulmonary/Chest: Effort normal and breath sounds normal.  Abdominal: Soft. Bowel sounds are normal.  Neurological: He is alert and oriented to person, place, and time.  Nursing note and vitals reviewed.     Assessment & Plan  1. Controlled type 2 diabetes mellitus without complication, without long-term current use of insulin (HCC)  A1c is 6.4%, consistent with well-controlled DM, DC basal insulin and continue on metformin. Advised to continue with walking to help achieve weight loss.  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 Start on Viagra 50 mg as needed. Follow-up in one month. - sildenafil (VIAGRA) 50 MG tablet; Take 1 tablet (50 mg total) by mouth daily as needed for erectile dysfunction.  Dispense: 10 tablet; Refill: 0  Kassim Guertin Asad A. [facility] 02/20/2016 9:17 AM

## 2016-03-21 ENCOUNTER — Telehealth: Payer: Self-pay | Admitting: Family Medicine

## 2016-03-21 DIAGNOSIS — F430 Acute stress reaction: Secondary | ICD-10-CM

## 2016-03-21 DIAGNOSIS — F411 Generalized anxiety disorder: Secondary | ICD-10-CM

## 2016-03-21 DIAGNOSIS — F419 Anxiety disorder, unspecified: Secondary | ICD-10-CM

## 2016-03-21 MED ORDER — ALPRAZOLAM 0.5 MG PO TABS: 0.5000 mg | ORAL_TABLET | Freq: Two times a day (BID) | ORAL | Status: DC | PRN

## 2016-03-21 NOTE — Telephone Encounter (Signed)
Refills needed on anxiety meds.

## 2016-03-21 NOTE — Telephone Encounter (Signed)
Prescription for Alprazolam is ready for pickup 

## 2016-03-25 ENCOUNTER — Other Ambulatory Visit: Payer: Self-pay

## 2016-03-25 DIAGNOSIS — F411 Generalized anxiety disorder: Secondary | ICD-10-CM

## 2016-03-25 DIAGNOSIS — F430 Acute stress reaction: Principal | ICD-10-CM

## 2016-03-25 MED ORDER — ALPRAZOLAM 0.5 MG PO TABS: 0.5000 mg | ORAL_TABLET | Freq: Two times a day (BID) | ORAL | Status: DC | PRN

## 2016-04-05 ENCOUNTER — Other Ambulatory Visit: Payer: Self-pay | Admitting: Family Medicine

## 2016-06-05 ENCOUNTER — Encounter: Payer: Self-pay | Admitting: Family Medicine

## 2016-06-05 ENCOUNTER — Ambulatory Visit (INDEPENDENT_AMBULATORY_CARE_PROVIDER_SITE_OTHER): Payer: BLUE CROSS/BLUE SHIELD | Admitting: Family Medicine

## 2016-06-05 VITALS — BP 126/80 | HR 75 | Temp 97.7°F | Resp 16 | Ht 68.0 in | Wt 238.6 lb

## 2016-06-05 DIAGNOSIS — F419 Anxiety disorder, unspecified: Secondary | ICD-10-CM

## 2016-06-05 DIAGNOSIS — E785 Hyperlipidemia, unspecified: Secondary | ICD-10-CM

## 2016-06-05 DIAGNOSIS — E119 Type 2 diabetes mellitus without complications: Secondary | ICD-10-CM

## 2016-06-05 DIAGNOSIS — M47812 Spondylosis without myelopathy or radiculopathy, cervical region: Secondary | ICD-10-CM | POA: Diagnosis not present

## 2016-06-05 LAB — LIPID PANEL
Cholesterol: 167 mg/dL (ref 125–200)
HDL: 59 mg/dL (ref >=40)
LDL CALC: 91 mg/dL (ref <130)
Total CHOL/HDL Ratio: 2.8 Ratio (ref <=5.0)
Triglycerides: 84 mg/dL (ref <150)
VLDL: 17 mg/dL (ref <30)

## 2016-06-05 LAB — POCT GLYCOSYLATED HEMOGLOBIN (HGB A1C): HEMOGLOBIN A1C: 6.8

## 2016-06-05 LAB — COMPLETE METABOLIC PANEL WITH GFR
ALBUMIN: 4.5 g/dL (ref 3.6–5.1)
ALK PHOS: 43 U/L (ref 40–115)
ALT: 83 U/L — ABNORMAL HIGH (ref 9–46)
AST: 43 U/L — AB (ref 10–40)
BILIRUBIN TOTAL: 0.7 mg/dL (ref 0.2–1.2)
BUN: 17 mg/dL (ref 7–25)
CO2: 26 mmol/L (ref 20–31)
Calcium: 10.3 mg/dL (ref 8.6–10.3)
Chloride: 104 mmol/L (ref 98–110)
Creat: 1.09 mg/dL (ref 0.60–1.35)
GFR, Est African American: >89 mL/min (ref >=60)
GFR, Est Non African American: 83 mL/min (ref >=60)
GLUCOSE: 130 mg/dL — AB (ref 65–99)
Potassium: 5 mmol/L (ref 3.5–5.3)
SODIUM: 138 mmol/L (ref 135–146)
TOTAL PROTEIN: 7.4 g/dL (ref 6.1–8.1)

## 2016-06-05 LAB — GLUCOSE, POCT (MANUAL RESULT ENTRY): POC GLUCOSE: 139 mg/dL — AB (ref 70–99)

## 2016-06-05 MED ORDER — DICLOFENAC SODIUM 75 MG PO TBEC: 75.0000 mg | DELAYED_RELEASE_TABLET | Freq: Two times a day (BID) | ORAL | 0 refills | Status: DC

## 2016-06-05 MED ORDER — ALPRAZOLAM 0.5 MG PO TABS: 0.5000 mg | ORAL_TABLET | Freq: Three times a day (TID) | ORAL | 0 refills | Status: DC | PRN

## 2016-06-05 NOTE — Progress Notes (Signed)
Name: Trevor Dennis.   MRN: [id number]    DOB: January 24, 1971   Date:06/05/2016       Progress Note  Subjective  Chief Complaint  Chief Complaint  Patient presents with  . Gastroesophageal Reflux  . Diabetes  . Anxiety    follow up medication refills    Diabetes  He presents for his follow-up diabetic visit. He has type 2 diabetes mellitus. His disease course has been improving. Hypoglycemia symptoms include nervousness/anxiousness. Pertinent negatives for diabetes include no fatigue, no polydipsia and no polyuria. Current diabetic treatment includes oral agent (monotherapy). He is following a diabetic diet. An ACE inhibitor/angiotensin II receptor blocker is not being taken.  Anxiety  Presents for follow-up visit. Symptoms include depressed mood, excessive worry, irritability, nervous/anxious behavior and palpitations. Symptoms occur most days. The severity of symptoms is moderate and causing significant distress.    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 Also has neck pain, worse with weather changes. X ray shows degenerative changes to C2-3.  His friend gave him Diclofenac to take which relieved his neck and knee pain and wondering if he can take Diclofenac when needed.    Past Medical History:  Diagnosis Date  . Anxiety   . Arthritis   . Diabetes mellitus without complication [facility])     Past Surgical History:  Procedure Laterality Date  . BACK SURGERY  8 years ago    Family History  Problem Relation Age of Onset  . Diabetes Mother   . Liver disease Brother     Social History   Social History  . Marital status: Married    Spouse name: N/A  . Number of children: N/A  . Years of education: N/A   Occupational History  . Not on file.   Social  History Main Topics  . Smoking status: Current Some Day Smoker    Packs/day: 1.00    Types: Cigarettes  . Smokeless tobacco: Never Used     Comment: social smoker like 1 some days  . Alcohol use 0.0 oz/week     Comment: Occasional   . Drug use: No  . Sexual activity: Yes    Partners: Female   Other Topics Concern  . Not on file   Social History Narrative  . No narrative on file     Current Outpatient Prescriptions:  .  ALPRAZolam (XANAX) 0.5 MG tablet, Take 1 tablet (0.5 mg total) by mouth 2 (two) times daily as needed., Disp: 60 tablet, Rfl: 0 .  Blood Glucose Monitoring Suppl (ONE TOUCH ULTRA MINI) W/DEVICE KIT, See admin instructions., Disp: , Rfl: 0 .  esomeprazole (NEXIUM) 40 MG capsule, Take by mouth., Disp: , Rfl:  .  Lancets 30G MISC, , Disp: , Rfl: 12 .  LEVEMIR FLEXTOUCH 100 UNIT/ML Pen, INJECT 10 UNITS INTO THE SKIN DAILY AT 10 PM, Disp: 1 pen, Rfl: 3 .  metFORMIN (GLUCOPHAGE) 1000 MG tablet, TAKE 1 TABLET (1,000 MG TOTAL) BY MOUTH 2 (TWO) TIMES DAILY WITH A MEAL., Disp: 180 tablet, Rfl: 0 .  Multiple Vitamin (MULTI VITAMIN DAILY PO), Take by mouth., Disp: , Rfl:  .  NOVOTWIST 32G X 5 MM MISC, Inject 10 Units into the skin See admin instructions., Disp: 100 each, Rfl: 0 .  ONE TOUCH ULTRA TEST test strip, USE TO TEST SUGAR 3 TIMES A DAY, Disp: , Rfl: 12 .  valACYclovir (VALTREX) 1000 MG tablet, Take 1 tablet (1,000 mg total) by mouth 2 (two) times daily., Disp: 20 tablet, Rfl: 0 .  sildenafil (VIAGRA) 50 MG tablet, Take 1 tablet (50 mg total) by mouth daily as needed for erectile dysfunction. (Patient not taking: Reported on 06/05/2016), Disp: 10 tablet, Rfl: 0  No Known Allergies   Review of Systems  Constitutional: Positive for irritability. Negative for chills, fatigue and fever.  Cardiovascular: Positive for palpitations.  Musculoskeletal: Positive for joint pain and neck pain.  Endo/Heme/Allergies: Negative for polydipsia.  Psychiatric/Behavioral: The patient  is nervous/anxious.     Objective  Vitals:   06/05/16 0817  BP: 126/80  Pulse: 75  Resp: 16  Temp: 97.7 F (36.5 C)  TempSrc: Oral  SpO2: 94%  Weight: 238 lb 9.6 oz (108.2 kg)  Height: '5\' 8"'  (1.727 m)    Physical Exam  Constitutional: He is oriented to person, place, and time and well-developed, well-nourished, and in no distress.  HENT:  Head: Normocephalic and atraumatic.  Cardiovascular: Normal rate, regular rhythm, S1 normal, S2 normal and normal heart sounds.   No murmur heard. Pulmonary/Chest: Effort normal and breath sounds normal. He has no wheezes. He has no rhonchi.  Abdominal: Soft. Bowel sounds are normal. There is no tenderness.  Musculoskeletal:       Right ankle: He exhibits no swelling.       Left ankle: He exhibits no swelling.       Cervical back: He exhibits tenderness, pain and spasm.  Neurological: He is alert and oriented to person, place, and time.  Psychiatric: Mood, memory, affect and judgment normal.  Nursing note and vitals reviewed.    Recent Results (from the past 2160 hour(s))  POCT Glucose (CBG)     Status: Abnormal   Collection Time: 06/05/16  8:20 AM  Result Value Ref Range   POC Glucose 139 (A) 70 - 99 mg/dl  POCT HgB A1C     Status: None   Collection Time: 06/05/16  8:25 AM  Result Value Ref Range   Hemoglobin A1C 6.8      Assessment & Plan  1. Anxiety Worsening anxiety, increase alprazolam to 3 times daily as needed, consider addition of SSRI therapy - ALPRAZolam (XANAX) 0.5 MG tablet; Take 1 tablet (0.5 mg total) by mouth 3 (three) times daily as needed.  Dispense: 90 tablet; Refill: 0  2. HLD (hyperlipidemia) Goal LDL is less than 100 mg/dL, recheck today and started on appropriate statin therapy - Lipid Profile - COMPLETE METABOLIC PANEL WITH GFR  3. Controlled type 2 diabetes mellitus without complication, without long-term current use of insulin (HCC)  A1c 6.8%, very well controlled diabetes. Continue on metformin as  prescribed  - POCT HgB A1C - POCT Glucose (CBG)  4. Degenerative arthritis of cervical spine Start on diclofenac 75 mg to be taken daily as needed for both neck and knee pain. Explained the possible long-term effects on gastric mucosa from NSAID therapy.  - diclofenac (VOLTAREN) 75 MG EC tablet; Take 1 tablet (75 mg total) by mouth 2 (two) times daily.  Dispense: 60 tablet; Refill: 0   Carine Nordgren Asad A. [facility] 06/05/2016 8:26 AM

## 2016-07-01 ENCOUNTER — Other Ambulatory Visit: Payer: Self-pay | Admitting: Family Medicine

## 2016-07-01 DIAGNOSIS — M47812 Spondylosis without myelopathy or radiculopathy, cervical region: Secondary | ICD-10-CM

## 2016-07-29 ENCOUNTER — Other Ambulatory Visit: Payer: Self-pay | Admitting: Family Medicine

## 2016-07-29 DIAGNOSIS — M47812 Spondylosis without myelopathy or radiculopathy, cervical region: Secondary | ICD-10-CM

## 2016-08-19 ENCOUNTER — Ambulatory Visit (INDEPENDENT_AMBULATORY_CARE_PROVIDER_SITE_OTHER): Payer: BLUE CROSS/BLUE SHIELD | Admitting: Family Medicine

## 2016-08-19 ENCOUNTER — Encounter: Payer: Self-pay | Admitting: Family Medicine

## 2016-08-19 VITALS — BP 130/77 | HR 79 | Temp 98.2°F | Resp 16 | Ht 68.0 in | Wt 245.2 lb

## 2016-08-19 DIAGNOSIS — J029 Acute pharyngitis, unspecified: Secondary | ICD-10-CM

## 2016-08-19 DIAGNOSIS — M545 Low back pain, unspecified: Secondary | ICD-10-CM | POA: Insufficient documentation

## 2016-08-19 LAB — POCT RAPID STREP A (OFFICE): RAPID STREP A SCREEN: NEGATIVE

## 2016-08-19 MED ORDER — AMOXICILLIN-POT CLAVULANATE 875-125 MG PO TABS: 1.0000 | ORAL_TABLET | Freq: Two times a day (BID) | ORAL | 0 refills | Status: DC

## 2016-08-19 NOTE — Progress Notes (Signed)
Name: Trevor Dennis.   MRN: [id number]    DOB: [birth year]   Date:08/19/2016       Progress Note  Subjective  Chief Complaint  Chief Complaint  Patient presents with  . Follow-up    possible virus     Sore Throat   This is a new problem. The current episode started in the past 7 days. The maximum temperature recorded prior to his arrival was 102 - 102.9 F. Associated symptoms include congestion, coughing, headaches and trouble swallowing. Treatments tried: Dayquil, Nyquil, also took antibiotic (Amoxicillin) left from last year.     Past Medical History:  Diagnosis Date  . Anxiety   . Arthritis   . Diabetes mellitus without complication [facility])     Past Surgical History:  Procedure Laterality Date  . BACK SURGERY  8 years ago    Family History  Problem Relation Age of Onset  . Diabetes Mother   . Liver disease Brother     Social History   Social History  . Marital status: Married    Spouse name: N/A  . Number of children: N/A  . Years of education: N/A   Occupational History  . Not on file.   Social History Main Topics  . Smoking status: Current Some Day Smoker    Packs/day: 1.00    Types: Cigarettes  . Smokeless tobacco: Never Used     Comment: social smoker like 1 some days  . Alcohol use 0.0 oz/week     Comment: Occasional   . Drug use: No  . Sexual activity: Yes    Partners: Female   Other Topics Concern  . Not on file   Social History Narrative  . No narrative on file     Current Outpatient Prescriptions:  .  ALPRAZolam (XANAX) 0.5 MG tablet, Take 1 tablet (0.5 mg total) by mouth 3 (three) times daily as needed., Disp: 90 tablet, Rfl: 0 .  Blood Glucose Monitoring Suppl (ONE TOUCH ULTRA MINI) W/DEVICE KIT, See admin instructions., Disp: , Rfl: 0 .  diclofenac (VOLTAREN) 75 MG EC tablet, TAKE 1 TABLET (75 MG TOTAL) BY MOUTH 2 (TWO) TIMES DAILY., Disp: 60 tablet, Rfl: 0 .  esomeprazole (NEXIUM) 40 MG capsule, Take by mouth., Disp: , Rfl:  .   Lancets 30G MISC, , Disp: , Rfl: 12 .  metFORMIN (GLUCOPHAGE) 1000 MG tablet, TAKE 1 TABLET (1,000 MG TOTAL) BY MOUTH 2 (TWO) TIMES DAILY WITH A MEAL., Disp: 180 tablet, Rfl: 0 .  Multiple Vitamin (MULTI VITAMIN DAILY PO), Take by mouth., Disp: , Rfl:  .  ONE TOUCH ULTRA TEST test strip, USE TO TEST SUGAR 3 TIMES A DAY, Disp: , Rfl: 12 .  sildenafil (VIAGRA) 50 MG tablet, Take 1 tablet (50 mg total) by mouth daily as needed for erectile dysfunction., Disp: 10 tablet, Rfl: 0 .  valACYclovir (VALTREX) 1000 MG tablet, Take 1 tablet (1,000 mg total) by mouth 2 (two) times daily., Disp: 20 tablet, Rfl: 0  No Known Allergies   Review of Systems  Constitutional: Positive for fever. Negative for chills.  HENT: Positive for congestion, sore throat and trouble swallowing.   Respiratory: Positive for cough.   Neurological: Positive for headaches.    Objective  Vitals:   08/19/16 1053  BP: 130/77  Pulse: 79  Resp: 16  Temp: 98.2 F (36.8 C)  TempSrc: Oral  SpO2: 97%  Weight: 245 lb 3.2 oz (111.2 kg)  Height: '5\' 8"'  (1.727 m)  Physical Exam  Constitutional: He is well-developed, well-nourished, and in no distress.  HENT:  Head: Normocephalic and atraumatic.  Right Ear: Tympanic membrane and ear canal normal. No drainage, swelling or tenderness.  Left Ear: Tympanic membrane and ear canal normal. No drainage, swelling or tenderness.  Mouth/Throat: Oropharyngeal exudate and posterior oropharyngeal erythema present.  Cardiovascular: Normal rate, regular rhythm, S1 normal and S2 normal.   No murmur heard. Pulmonary/Chest: Breath sounds normal. He has no wheezes.  Musculoskeletal:       Lumbar back: He exhibits tenderness.       Back:  Lymphadenopathy:    He has cervical adenopathy.       Left cervical: Superficial cervical adenopathy present.  Nursing note and vitals reviewed.    Assessment & Plan  1. Throat soreness Point of care rapid strep is negative - POCT rapid strep  A  2. Pharyngitis, unspecified etiology We'll start on amoxicillin-clavulanate for 10 days for presumed bacterial pharyngitis. - amoxicillin-clavulanate (AUGMENTIN) 875-125 MG tablet; Take 1 tablet by mouth 2 (two) times daily.  Dispense: 20 tablet; Refill: 0  3. Acute midline low back pain without sciatica Likely secondary to muscle trauma due to coughing, advised to use heating pad, may take diclofenac daily when needed. Follow-up if no clinical improvement.   Tecla Mailloux Asad A. [facility] 08/19/2016 11:02 AM

## 2016-08-20 ENCOUNTER — Ambulatory Visit: Payer: BLUE CROSS/BLUE SHIELD | Admitting: Family Medicine

## 2016-08-30 ENCOUNTER — Other Ambulatory Visit: Payer: Self-pay | Admitting: Family Medicine

## 2016-08-30 DIAGNOSIS — M47812 Spondylosis without myelopathy or radiculopathy, cervical region: Secondary | ICD-10-CM

## 2016-09-13 ENCOUNTER — Ambulatory Visit: Payer: BLUE CROSS/BLUE SHIELD | Admitting: Family Medicine

## 2016-10-20 ENCOUNTER — Other Ambulatory Visit: Payer: Self-pay | Admitting: Family Medicine

## 2016-10-20 DIAGNOSIS — M47812 Spondylosis without myelopathy or radiculopathy, cervical region: Secondary | ICD-10-CM

## 2016-10-25 ENCOUNTER — Encounter: Payer: Self-pay | Admitting: Family Medicine

## 2016-10-25 ENCOUNTER — Ambulatory Visit (INDEPENDENT_AMBULATORY_CARE_PROVIDER_SITE_OTHER): Payer: BLUE CROSS/BLUE SHIELD | Admitting: Family Medicine

## 2016-10-25 VITALS — BP 124/70 | HR 81 | Temp 97.9°F | Resp 18 | Ht 68.0 in | Wt 252.5 lb

## 2016-10-25 DIAGNOSIS — E669 Obesity, unspecified: Secondary | ICD-10-CM | POA: Diagnosis not present

## 2016-10-25 DIAGNOSIS — E119 Type 2 diabetes mellitus without complications: Secondary | ICD-10-CM

## 2016-10-25 DIAGNOSIS — F419 Anxiety disorder, unspecified: Secondary | ICD-10-CM

## 2016-10-25 DIAGNOSIS — K219 Gastro-esophageal reflux disease without esophagitis: Secondary | ICD-10-CM | POA: Insufficient documentation

## 2016-10-25 DIAGNOSIS — IMO0001 Reserved for inherently not codable concepts without codable children: Secondary | ICD-10-CM

## 2016-10-25 LAB — COMPLETE METABOLIC PANEL WITH GFR
ALT: 117 U/L — ABNORMAL HIGH (ref 9–46)
AST: 57 U/L — AB (ref 10–40)
Albumin: 4.6 g/dL (ref 3.6–5.1)
Alkaline Phosphatase: 48 U/L (ref 40–115)
BUN: 16 mg/dL (ref 7–25)
CALCIUM: 9.6 mg/dL (ref 8.6–10.3)
CHLORIDE: 103 mmol/L (ref 98–110)
CO2: 25 mmol/L (ref 20–31)
Creat: 0.91 mg/dL (ref 0.60–1.35)
GFR, Est African American: >89 mL/min (ref >=60)
GFR, Est Non African American: >89 mL/min (ref >=60)
Glucose, Bld: 122 mg/dL — ABNORMAL HIGH (ref 65–99)
POTASSIUM: 5 mmol/L (ref 3.5–5.3)
Sodium: 138 mmol/L (ref 135–146)
Total Bilirubin: 0.5 mg/dL (ref 0.2–1.2)
Total Protein: 7.7 g/dL (ref 6.1–8.1)

## 2016-10-25 LAB — LIPID PANEL
CHOL/HDL RATIO: 3.4 ratio (ref <5.0)
CHOLESTEROL: 175 mg/dL (ref <200)
HDL: 52 mg/dL (ref >40)
LDL Cholesterol: 102 mg/dL — ABNORMAL HIGH (ref <100)
Triglycerides: 106 mg/dL (ref <150)
VLDL: 21 mg/dL (ref <30)

## 2016-10-25 LAB — POCT GLYCOSYLATED HEMOGLOBIN (HGB A1C): Hemoglobin A1C: 7.5

## 2016-10-25 LAB — GLUCOSE, POCT (MANUAL RESULT ENTRY): POC Glucose: 145 mg/dl — AB (ref 70–99)

## 2016-10-25 MED ORDER — ESOMEPRAZOLE MAGNESIUM 40 MG PO CPDR: 40.0000 mg | DELAYED_RELEASE_CAPSULE | Freq: Every day | ORAL | 0 refills | Status: DC

## 2016-10-25 MED ORDER — ALPRAZOLAM 0.5 MG PO TABS: 0.5000 mg | ORAL_TABLET | Freq: Three times a day (TID) | ORAL | 0 refills | Status: DC | PRN

## 2016-10-25 MED ORDER — ATORVASTATIN CALCIUM 10 MG PO TABS: 10.0000 mg | ORAL_TABLET | Freq: Every day | ORAL | 0 refills | Status: DC

## 2016-10-25 MED ORDER — NALTREXONE-BUPROPION HCL ER 8-90 MG PO TB12: 2.0000 | ORAL_TABLET | Freq: Two times a day (BID) | ORAL | 0 refills | Status: DC

## 2016-10-25 NOTE — Progress Notes (Signed)
Name: Trevor Dennis.   MRN: [id number]    DOB: 1971-05-31   Date:10/25/2016       Progress Note  Subjective  Chief Complaint  Chief Complaint  Patient presents with  . Diabetes    follow up  . Anxiety    Diabetes  He presents for his follow-up diabetic visit. He has type 2 diabetes mellitus. His disease course has been improving. Hypoglycemia symptoms include nervousness/anxiousness. Pertinent negatives for diabetes include no fatigue, no polydipsia and no polyuria. Current diabetic treatment includes oral agent (monotherapy). He is following a diabetic diet. His breakfast blood glucose range is generally 140-180 mg/dl. An ACE inhibitor/angiotensin II receptor blocker is not being taken.  Anxiety  Presents for follow-up visit. Symptoms include depressed mood, excessive worry, irritability, nervous/anxious behavior and palpitations. Patient reports no nausea. Symptoms occur most days. The severity of symptoms is moderate and causing significant distress.    Gastroesophageal Reflux  He reports no abdominal pain, no coughing, no heartburn or no nausea. This is a chronic problem. The symptoms are aggravated by certain foods (tomatoes and green peppers make it worse). Pertinent negatives include no fatigue. He has tried a PPI for the symptoms.   Obesity: Pt.'s current weight is 252lbs, BMI is 38.39kg/m2, he states that he is trying to lose weight, working out at the in-home [employer], eats in moderation especially given that he has diabetes. He is frustrated with his inability to lose weight and is requesting a medication to achieve weight loss.  He has never been on any anti-obesity agent before.   Past Medical History:  Diagnosis Date  . Anxiety   . Arthritis   . Diabetes mellitus without complication [facility])     Past Surgical History:  Procedure Laterality Date  . BACK SURGERY  8 years ago    Family History  Problem Relation Age of Onset  . Diabetes Mother   . Liver disease Brother      Social History   Social History  . Marital status: Married    Spouse name: N/A  . Number of children: N/A  . Years of education: N/A   Occupational History  . Not on file.   Social History Main Topics  . Smoking status: Current Some Day Smoker    Packs/day: 1.00    Types: Cigarettes  . Smokeless tobacco: Never Used     Comment: social smoker like 1 some days  . Alcohol use 0.0 oz/week     Comment: Occasional   . Drug use: No  . Sexual activity: Yes    Partners: Female   Other Topics Concern  . Not on file   Social History Narrative  . No narrative on file     Current Outpatient Prescriptions:  .  ALPRAZolam (XANAX) 0.5 MG tablet, Take 1 tablet (0.5 mg total) by mouth 3 (three) times daily as needed., Disp: 90 tablet, Rfl: 0 .  Blood Glucose Monitoring Suppl (ONE TOUCH ULTRA MINI) W/DEVICE KIT, See admin instructions., Disp: , Rfl: 0 .  diclofenac (VOLTAREN) 75 MG EC tablet, TAKE 1 TABLET (75 MG TOTAL) BY MOUTH 2 (TWO) TIMES DAILY., Disp: 60 tablet, Rfl: 0 .  esomeprazole (NEXIUM) 40 MG capsule, Take by mouth., Disp: , Rfl:  .  Lancets 30G MISC, , Disp: , Rfl: 12 .  metFORMIN (GLUCOPHAGE) 1000 MG tablet, TAKE 1 TABLET (1,000 MG TOTAL) BY MOUTH 2 (TWO) TIMES DAILY WITH A MEAL., Disp: 180 tablet, Rfl: 0 .  Multiple Vitamin (MULTI VITAMIN  DAILY PO), Take by mouth., Disp: , Rfl:  .  ONE TOUCH ULTRA TEST test strip, USE TO TEST SUGAR 3 TIMES A DAY, Disp: , Rfl: 12 .  sildenafil (VIAGRA) 50 MG tablet, Take 1 tablet (50 mg total) by mouth daily as needed for erectile dysfunction., Disp: 10 tablet, Rfl: 0 .  valACYclovir (VALTREX) 1000 MG tablet, Take 1 tablet (1,000 mg total) by mouth 2 (two) times daily., Disp: 20 tablet, Rfl: 0  No Known Allergies   Review of Systems  Constitutional: Positive for irritability. Negative for fatigue.  Respiratory: Negative for cough.   Cardiovascular: Positive for palpitations.  Gastrointestinal: Negative for abdominal pain, heartburn  and nausea.  Endo/Heme/Allergies: Negative for polydipsia.  Psychiatric/Behavioral: The patient is nervous/anxious.      Objective  Vitals:   10/25/16 0847  BP: 124/70  Pulse: 81  Resp: 18  Temp: 97.9 F (36.6 C)  TempSrc: Oral  SpO2: 98%  Weight: 252 lb 8 oz (114.5 kg)  Height: _0  (1.727 m)    Physical Exam  Constitutional: He is oriented to person, place, and time and well-developed, well-nourished, and in no distress.  HENT:  Head: Normocephalic and atraumatic.  Cardiovascular: Normal rate, regular rhythm and normal heart sounds.   No murmur heard. Pulmonary/Chest: Effort normal and breath sounds normal. He has no wheezes.  Abdominal: Soft. Bowel sounds are normal. There is no tenderness.  Neurological: He is alert and oriented to person, place, and time.  Psychiatric: Mood, memory, affect and judgment normal.  Nursing note and vitals reviewed.    Recent Results (from the past 2160 hour(s))  POCT rapid strep A     Status: Normal   Collection Time: 08/19/16 11:21 AM  Result Value Ref Range   Rapid Strep A Screen Negative Negative  POCT Glucose (CBG)     Status: Abnormal   Collection Time: 10/25/16  8:50 AM  Result Value Ref Range   POC Glucose 145 (A) 70 - 99 mg/dl  POCT HgB A1C     Status: None   Collection Time: 10/25/16  8:52 AM  Result Value Ref Range   Hemoglobin A1C 7.5      Assessment & Plan  1. Anxiety Stable and responsive to alprazolam, refills provided - ALPRAZolam (XANAX) 0.5 MG tablet; Take 1 tablet (0.5 mg total) by mouth 3 (three) times daily as needed.  Dispense: 90 tablet; Refill: 0  2. Gastroesophageal reflux disease, esophagitis presence not specified  - esomeprazole (NEXIUM) 40 MG capsule; Take 1 capsule (40 mg total) by mouth daily.  Dispense: 90 capsule; Refill: 0  3. Obesity, Class II, BMI 35-39.9, with comorbidity Advised to continue with dietary and lifestyle modifications including physical activity, we'll start on Contrave  for management of obesity. Educated on all potential adverse effects. Recheck in 3 months - Naltrexone-Bupropion HCl ER (CONTRAVE) 8-90 MG TB12; Take 2 tablets by mouth 2 (two) times daily. Start 1 tab po qAM x 1wk, then 1 tab po bid x 1 wk, then 2 tabs po qAM and 1 tab po qpm x 1 wk.  Dispense: 360 tablet; Refill: 0  4. Controlled type 2 diabetes mellitus without complication, without long-term current use of insulin (HCC) Left A1c 7.5%, diabetes control not optimal, advised to increase physical activity, continue metformin as prescribed. Recheck in 3 months - POCT HgB A1C - POCT Glucose (CBG) - Lipid Profile - COMPLETE METABOLIC PANEL WITH GFR - atorvastatin (LIPITOR) 10 MG tablet; Take 1 tablet (10 mg total) by  mouth daily.  Dispense: 90 tablet; Refill: 0  Chontel Warning [facility] 10/25/2016 8:56 AM

## 2016-10-28 ENCOUNTER — Telehealth: Payer: Self-pay | Admitting: Family Medicine

## 2016-10-28 NOTE — Telephone Encounter (Signed)
PT SAID THAT THE MEDICATION CONTRAVE THE INSURANCE  WILL NOT COVER AND HE IS TO CALL BACK WITH THE MEDICATION THAT THEY WILL COVER.

## 2016-10-29 NOTE — Telephone Encounter (Signed)
Returned patient call and left voicemail confirming that the medication contrave is not covered under insurance and inquiring which medication will be covered under his insurance, asked patient to please return call

## 2016-11-28 ENCOUNTER — Other Ambulatory Visit: Payer: Self-pay | Admitting: Family Medicine

## 2016-11-28 DIAGNOSIS — M47812 Spondylosis without myelopathy or radiculopathy, cervical region: Secondary | ICD-10-CM

## 2016-12-31 ENCOUNTER — Other Ambulatory Visit: Payer: Self-pay | Admitting: Family Medicine

## 2016-12-31 DIAGNOSIS — M47812 Spondylosis without myelopathy or radiculopathy, cervical region: Secondary | ICD-10-CM

## 2017-01-06 ENCOUNTER — Other Ambulatory Visit: Payer: Self-pay | Admitting: Family Medicine

## 2017-01-06 DIAGNOSIS — E119 Type 2 diabetes mellitus without complications: Secondary | ICD-10-CM

## 2017-01-06 DIAGNOSIS — K219 Gastro-esophageal reflux disease without esophagitis: Secondary | ICD-10-CM

## 2017-01-23 ENCOUNTER — Ambulatory Visit (INDEPENDENT_AMBULATORY_CARE_PROVIDER_SITE_OTHER): Payer: BLUE CROSS/BLUE SHIELD | Admitting: Family Medicine

## 2017-01-23 ENCOUNTER — Encounter: Payer: Self-pay | Admitting: Family Medicine

## 2017-01-23 VITALS — BP 122/67 | HR 88 | Temp 98.0°F | Resp 16 | Ht 68.0 in | Wt 248.7 lb

## 2017-01-23 DIAGNOSIS — E349 Endocrine disorder, unspecified: Secondary | ICD-10-CM

## 2017-01-23 DIAGNOSIS — R748 Abnormal levels of other serum enzymes: Secondary | ICD-10-CM | POA: Diagnosis not present

## 2017-01-23 DIAGNOSIS — IMO0001 Reserved for inherently not codable concepts without codable children: Secondary | ICD-10-CM

## 2017-01-23 DIAGNOSIS — E7800 Pure hypercholesterolemia, unspecified: Secondary | ICD-10-CM | POA: Diagnosis not present

## 2017-01-23 DIAGNOSIS — E1165 Type 2 diabetes mellitus with hyperglycemia: Secondary | ICD-10-CM

## 2017-01-23 DIAGNOSIS — R7989 Other specified abnormal findings of blood chemistry: Secondary | ICD-10-CM

## 2017-01-23 LAB — CBC WITH DIFFERENTIAL/PLATELET
BASOS ABS: 86 {cells}/uL (ref 0–200)
Basophils Relative: 1 %
Eosinophils Absolute: 344 cells/uL (ref 15–500)
Eosinophils Relative: 4 %
HEMATOCRIT: 42.1 % (ref 38.5–50.0)
HEMOGLOBIN: 14 g/dL (ref 13.2–17.1)
LYMPHS ABS: 2838 {cells}/uL (ref 850–3900)
LYMPHS PCT: 33 %
MCH: 30.1 pg (ref 27.0–33.0)
MCHC: 33.3 g/dL (ref 32.0–36.0)
MCV: 90.5 fL (ref 80.0–100.0)
MONO ABS: 688 {cells}/uL (ref 200–950)
MPV: 11.1 fL (ref 7.5–12.5)
Monocytes Relative: 8 %
NEUTROS PCT: 54 %
Neutro Abs: 4644 cells/uL (ref 1500–7800)
Platelets: 252 10*3/uL (ref 140–400)
RBC: 4.65 MIL/uL (ref 4.20–5.80)
RDW: 13.5 % (ref 11.0–15.0)
WBC: 8.6 10*3/uL (ref 3.8–10.8)

## 2017-01-23 LAB — COMPLETE METABOLIC PANEL WITH GFR
ALBUMIN: 4.5 g/dL (ref 3.6–5.1)
ALK PHOS: 46 U/L (ref 40–115)
ALT: 113 U/L — ABNORMAL HIGH (ref 9–46)
AST: 66 U/L — ABNORMAL HIGH (ref 10–40)
BILIRUBIN TOTAL: 0.4 mg/dL (ref 0.2–1.2)
BUN: 16 mg/dL (ref 7–25)
CALCIUM: 9.8 mg/dL (ref 8.6–10.3)
CO2: 27 mmol/L (ref 20–31)
Chloride: 105 mmol/L (ref 98–110)
Creat: 1 mg/dL (ref 0.60–1.35)
GFR, Est African American: >89 mL/min (ref >=60)
Glucose, Bld: 136 mg/dL — ABNORMAL HIGH (ref 65–99)
Potassium: 5.1 mmol/L (ref 3.5–5.3)
Sodium: 140 mmol/L (ref 135–146)
TOTAL PROTEIN: 7.5 g/dL (ref 6.1–8.1)

## 2017-01-23 LAB — LIPID PANEL
CHOLESTEROL: 120 mg/dL (ref <200)
HDL: 43 mg/dL (ref >40)
LDL Cholesterol: 58 mg/dL (ref <100)
TRIGLYCERIDES: 97 mg/dL (ref <150)
Total CHOL/HDL Ratio: 2.8 Ratio (ref <5.0)
VLDL: 19 mg/dL (ref <30)

## 2017-01-23 LAB — POCT GLYCOSYLATED HEMOGLOBIN (HGB A1C): Hemoglobin A1C: >14

## 2017-01-23 LAB — GLUCOSE, POCT (MANUAL RESULT ENTRY): POC GLUCOSE: 160 mg/dL — AB (ref 70–99)

## 2017-01-23 MED ORDER — LIRAGLUTIDE 18 MG/3ML ~~LOC~~ SOPN: 1.8000 mg | PEN_INJECTOR | Freq: Every day | SUBCUTANEOUS | 2 refills | Status: DC

## 2017-01-23 NOTE — Progress Notes (Signed)
Name: Trevor Dennis.   MRN: [id number]    DOB: 18-Feb-1971   Date:01/23/2017       Progress Note  Subjective  Chief Complaint  Chief Complaint  Patient presents with  . Follow-up    3 mo    Diabetes  He presents for his follow-up diabetic visit. He has type 2 diabetes mellitus. His disease course has been worsening. Hypoglycemia symptoms include headaches, nervousness/anxiousness and sweats. Pertinent negatives for hypoglycemia include no dizziness or hunger. Pertinent negatives for diabetes include no chest pain, no fatigue, no foot paresthesias, no polydipsia and no polyuria. Pertinent negatives for diabetic complications include no CVA or heart disease. Current diabetic treatment includes oral agent (monotherapy) and diet. He is compliant with treatment some of the time (has been eating out a lot, eating bread, drinks diet sodas.). His weight is stable. He is following a generally unhealthy (bread, restaurant foods etc) diet. He participates in exercise every other day. His breakfast blood glucose range is generally 130-140 mg/dl. An ACE inhibitor/angiotensin II receptor blocker is not being taken.  Hyperlipidemia  This is a chronic problem. The problem is uncontrolled. Recent lipid tests were reviewed and are high. Pertinent negatives include no chest pain, leg pain, myalgias or shortness of breath. Current antihyperlipidemic treatment includes statins.   Patient is also requesting to recheck his testosterone levels, has history of low testosterone, previously has been on Androgel, has noticed hairless patches on his bears area, which is concerning for him.   Past Medical History:  Diagnosis Date  . Anxiety   . Arthritis   . Diabetes mellitus without complication [facility])     Past Surgical History:  Procedure Laterality Date  . BACK SURGERY  8 years ago    Family History  Problem Relation Age of Onset  . Diabetes Mother   . Liver disease Brother     Social History   Social  History  . Marital status: Married    Spouse name: N/A  . Number of children: N/A  . Years of education: N/A   Occupational History  . Not on file.   Social History Main Topics  . Smoking status: Current Some Day Smoker    Packs/day: 1.00    Types: Cigarettes  . Smokeless tobacco: Never Used     Comment: social smoker like 1 some days  . Alcohol use 0.0 oz/week     Comment: Occasional   . Drug use: No  . Sexual activity: Yes    Partners: Female   Other Topics Concern  . Not on file   Social History Narrative  . No narrative on file     Current Outpatient Prescriptions:  .  ALPRAZolam (XANAX) 0.5 MG tablet, Take 1 tablet (0.5 mg total) by mouth 3 (three) times daily as needed., Disp: 90 tablet, Rfl: 0 .  atorvastatin (LIPITOR) 10 MG tablet, TAKE 1 TABLET (10 MG TOTAL) BY MOUTH DAILY., Disp: 90 tablet, Rfl: 0 .  Blood Glucose Monitoring Suppl (ONE TOUCH ULTRA MINI) W/DEVICE KIT, See admin instructions., Disp: , Rfl: 0 .  diclofenac (VOLTAREN) 75 MG EC tablet, TAKE 1 TABLET (75 MG TOTAL) BY MOUTH 2 (TWO) TIMES DAILY., Disp: 60 tablet, Rfl: 0 .  esomeprazole (NEXIUM) 40 MG capsule, TAKE 1 CAPSULE (40 MG TOTAL) BY MOUTH DAILY., Disp: 90 capsule, Rfl: 0 .  Lancets 30G MISC, , Disp: , Rfl: 12 .  metFORMIN (GLUCOPHAGE) 1000 MG tablet, TAKE 1 TABLET (1,000 MG TOTAL) BY MOUTH 2 (TWO) TIMES  DAILY WITH A MEAL., Disp: 180 tablet, Rfl: 0 .  Multiple Vitamin (MULTI VITAMIN DAILY PO), Take by mouth., Disp: , Rfl:  .  ONE TOUCH ULTRA TEST test strip, USE TO TEST SUGAR 3 TIMES A DAY, Disp: , Rfl: 12 .  valACYclovir (VALTREX) 1000 MG tablet, Take 1 tablet (1,000 mg total) by mouth 2 (two) times daily., Disp: 20 tablet, Rfl: 0 .  Naltrexone-Bupropion HCl ER (CONTRAVE) 8-90 MG TB12, Take 2 tablets by mouth 2 (two) times daily. Start 1 tab po qAM x 1wk, then 1 tab po bid x 1 wk, then 2 tabs po qAM and 1 tab po qpm x 1 wk. (Patient not taking: Reported on 01/23/2017), Disp: 360 tablet, Rfl: 0 .   sildenafil (VIAGRA) 50 MG tablet, Take 1 tablet (50 mg total) by mouth daily as needed for erectile dysfunction. (Patient not taking: Reported on 01/23/2017), Disp: 10 tablet, Rfl: 0  No Known Allergies   Review of Systems  Constitutional: Negative for fatigue.  Respiratory: Negative for shortness of breath.   Cardiovascular: Negative for chest pain.  Musculoskeletal: Negative for myalgias.  Neurological: Positive for headaches. Negative for dizziness.  Endo/Heme/Allergies: Negative for polydipsia.  Psychiatric/Behavioral: The patient is nervous/anxious.     Objective  Vitals:   01/23/17 0849  BP: 122/67  Pulse: 88  Resp: 16  Temp: 98 F (36.7 C)  TempSrc: Oral  SpO2: 96%  Weight: 248 lb 11.2 oz (112.8 kg)  Height: 5' 8" (1.727 m)    Physical Exam  Constitutional: He is oriented to person, place, and time and well-developed, well-nourished, and in no distress.  HENT:  Head: Normocephalic and atraumatic.  Cardiovascular: Normal rate, regular rhythm and normal heart sounds.   No murmur heard. Pulmonary/Chest: Effort normal and breath sounds normal. He has no wheezes.  Abdominal: Soft. Bowel sounds are normal. There is no tenderness.  Neurological: He is alert and oriented to person, place, and time.  Psychiatric: Mood, memory, affect and judgment normal.  Nursing note and vitals reviewed.      Assessment & Plan  1. Uncontrolled type 2 diabetes mellitus without complication, without long-term current use of insulin (HCC) Controlled diabetes with A1c greater than 14, suspect dietary nonadherence, start on Victoza to be taken every day, continue on metformin, advised on changing his diet and lifestyle to promote better glycemic control. Reassess in 3 months - POCT HgB A1C - POCT Glucose (CBG) - Urine Microalbumin w/creat. ratio - liraglutide 18 MG/3ML SOPN; Inject 0.3 mLs (1.8 mg total) into the skin daily. 0.6 mg [place] q day x 1week, then 1.2 mg [place] q day x 1 week, then  1.8 mg [place] q day.  Dispense: 5 pen; Refill: 2  2. Elevated liver enzymes Patient has been diagnosed with fatty liver disease, repeat liver enzymes - COMPLETE METABOLIC PANEL WITH GFR  3. Pure hypercholesterolemia  - Lipid panel  4. Low testosterone Previously on Andro gel, repeat testosterone levels and follow-up - Testosterone - PSA - CBC with Differential/Platelet  Sarh Kirschenbaum [facility] 01/23/2017 9:03 AM

## 2017-01-24 LAB — MICROALBUMIN / CREATININE URINE RATIO
Creatinine, Urine: 170 mg/dL (ref 20–370)
MICROALB/CREAT RATIO: 24 ug/mg{creat} (ref <30)
Microalb, Ur: 4 mg/dL

## 2017-01-24 LAB — TESTOSTERONE: TESTOSTERONE: 148 ng/dL — AB (ref 250–827)

## 2017-01-24 LAB — PSA: PSA: 0.5 ng/mL (ref <=4.0)

## 2017-02-23 ENCOUNTER — Other Ambulatory Visit: Payer: Self-pay | Admitting: Family Medicine

## 2017-02-23 DIAGNOSIS — M47812 Spondylosis without myelopathy or radiculopathy, cervical region: Secondary | ICD-10-CM

## 2017-03-04 ENCOUNTER — Telehealth: Payer: Self-pay | Admitting: Family Medicine

## 2017-03-04 ENCOUNTER — Other Ambulatory Visit: Payer: Self-pay | Admitting: Family Medicine

## 2017-03-04 NOTE — Telephone Encounter (Signed)
Pt is requesting that Dr Sherryll BurgerShah call in the Novatwist to go with the Victoza. [facility].

## 2017-03-04 NOTE — Telephone Encounter (Signed)
Script [facility]

## 2017-03-26 ENCOUNTER — Other Ambulatory Visit: Payer: Self-pay | Admitting: Family Medicine

## 2017-03-26 DIAGNOSIS — M47812 Spondylosis without myelopathy or radiculopathy, cervical region: Secondary | ICD-10-CM

## 2017-04-16 ENCOUNTER — Other Ambulatory Visit: Payer: Self-pay | Admitting: Family Medicine

## 2017-04-16 DIAGNOSIS — E119 Type 2 diabetes mellitus without complications: Secondary | ICD-10-CM

## 2017-04-16 DIAGNOSIS — K219 Gastro-esophageal reflux disease without esophagitis: Secondary | ICD-10-CM

## 2017-04-25 ENCOUNTER — Ambulatory Visit: Payer: BLUE CROSS/BLUE SHIELD | Admitting: Family Medicine

## 2017-04-27 ENCOUNTER — Other Ambulatory Visit: Payer: Self-pay | Admitting: Family Medicine

## 2017-04-27 DIAGNOSIS — E1165 Type 2 diabetes mellitus with hyperglycemia: Principal | ICD-10-CM

## 2017-04-27 DIAGNOSIS — IMO0001 Reserved for inherently not codable concepts without codable children: Secondary | ICD-10-CM

## 2017-05-02 ENCOUNTER — Ambulatory Visit
Admission: RE | Admit: 2017-05-02 | Discharge: 2017-05-02 | Disposition: A | Discharge status: Home / Self Care | Payer: BLUE CROSS/BLUE SHIELD | Source: Ambulatory Visit | Attending: Family Medicine | Admitting: Family Medicine

## 2017-05-02 ENCOUNTER — Ambulatory Visit (INDEPENDENT_AMBULATORY_CARE_PROVIDER_SITE_OTHER): Payer: BLUE CROSS/BLUE SHIELD | Admitting: Family Medicine

## 2017-05-02 ENCOUNTER — Encounter: Payer: Self-pay | Admitting: Family Medicine

## 2017-05-02 VITALS — BP 118/64 | HR 88 | Temp 97.3°F | Resp 18 | Ht 68.0 in | Wt 240.1 lb

## 2017-05-02 DIAGNOSIS — M79672 Pain in left foot: Secondary | ICD-10-CM

## 2017-05-02 DIAGNOSIS — E7800 Pure hypercholesterolemia, unspecified: Secondary | ICD-10-CM

## 2017-05-02 DIAGNOSIS — R748 Abnormal levels of other serum enzymes: Secondary | ICD-10-CM

## 2017-05-02 DIAGNOSIS — E119 Type 2 diabetes mellitus without complications: Secondary | ICD-10-CM

## 2017-05-02 DIAGNOSIS — F419 Anxiety disorder, unspecified: Secondary | ICD-10-CM

## 2017-05-02 LAB — LIPID PANEL
Cholesterol: 191 mg/dL (ref <200)
HDL: 47 mg/dL (ref >40)
LDL CALC: 120 mg/dL — AB (ref <100)
Total CHOL/HDL Ratio: 4.1 Ratio (ref <5.0)
Triglycerides: 118 mg/dL (ref <150)
VLDL: 24 mg/dL (ref <30)

## 2017-05-02 LAB — HEPATIC FUNCTION PANEL
ALT: 107 U/L — AB (ref 9–46)
AST: 53 U/L — AB (ref 10–40)
Albumin: 4.8 g/dL (ref 3.6–5.1)
Alkaline Phosphatase: 43 U/L (ref 40–115)
BILIRUBIN DIRECT: 0.1 mg/dL (ref <=0.2)
BILIRUBIN INDIRECT: 0.5 mg/dL (ref 0.2–1.2)
Total Bilirubin: 0.6 mg/dL (ref 0.2–1.2)
Total Protein: 7.9 g/dL (ref 6.1–8.1)

## 2017-05-02 LAB — POCT GLYCOSYLATED HEMOGLOBIN (HGB A1C): HEMOGLOBIN A1C: 6.5

## 2017-05-02 MED ORDER — ALPRAZOLAM 0.5 MG PO TABS: 0.5000 mg | ORAL_TABLET | Freq: Three times a day (TID) | ORAL | 0 refills | Status: DC | PRN

## 2017-05-02 NOTE — Progress Notes (Signed)
Name: Trevor Dennis.   MRN: [id number]    DOB: [birth year]   Date:05/02/2017       Progress Note  Subjective  Chief Complaint  Chief Complaint  Patient presents with  . Diabetes    3 month follow up  . Hyperlipidemia  . Anxiety    Diabetes  He presents for his follow-up diabetic visit. His disease course has been stable. Pertinent negatives for hypoglycemia include no dizziness, headaches, nervousness/anxiousness or sweats. Pertinent negatives for diabetes include no fatigue, no foot paresthesias, no polydipsia and no polyuria. Risk factors for coronary artery disease include dyslipidemia, male sex and obesity. Current diabetic treatment includes oral agent (monotherapy). His weight is stable. He is following a diabetic and generally healthy diet. He rarely ([place], he works in [employer], walks in the neighborhood) participates in exercise. He monitors blood glucose at home 1-2 x per day. His breakfast blood glucose range is generally 130-140 mg/dl. An ACE inhibitor/angiotensin II receptor blocker is not being taken.  Hyperlipidemia  This is a chronic problem. The problem is controlled. Recent lipid tests were reviewed and are normal. Pertinent negatives include no leg pain, myalgias or shortness of breath. Current antihyperlipidemic treatment includes statins (statins on hold for elevated liver enzymes.).  Anxiety  Presents for follow-up visit. Symptoms include depressed mood and insomnia. Patient reports no dizziness, excessive worry, nervous/anxious behavior or shortness of breath. The severity of symptoms is moderate.    Patient complains of left foot pain around the bottom of second and third toes, present for 4-5 months, worse with weight bearing, left foot appears inflamed at night after he has been on his feet all day, he has tried using memory foam shoes without relief.    Past Medical History:  Diagnosis Date  . Anxiety   . Arthritis   . Diabetes mellitus without  complication [facility])     Past Surgical History:  Procedure Laterality Date  . BACK SURGERY  8 years ago    Family History  Problem Relation Age of Onset  . Diabetes Mother   . Liver disease Brother     Social History   Social History  . Marital status: Married    Spouse name: N/A  . Number of children: N/A  . Years of education: N/A   Occupational History  . Not on file.   Social History Main Topics  . Smoking status: Current Some Day Smoker    Packs/day: 1.00    Types: Cigarettes  . Smokeless tobacco: Never Used     Comment: social smoker like 1 some days  . Alcohol use 0.0 oz/week     Comment: Occasional   . Drug use: No  . Sexual activity: Yes    Partners: Female   Other Topics Concern  . Not on file   Social History Narrative  . No narrative on file     Current Outpatient Prescriptions:  .  ALPRAZolam (XANAX) 0.5 MG tablet, Take 1 tablet (0.5 mg total) by mouth 3 (three) times daily as needed., Disp: 90 tablet, Rfl: 0 .  atorvastatin (LIPITOR) 10 MG tablet, TAKE 1 TABLET (10 MG TOTAL) BY MOUTH DAILY., Disp: 90 tablet, Rfl: 0 .  Blood Glucose Monitoring Suppl (ONE TOUCH ULTRA MINI) W/DEVICE KIT, See admin instructions., Disp: , Rfl: 0 .  diclofenac (VOLTAREN) 75 MG EC tablet, TAKE 1 TABLET (75 MG TOTAL) BY MOUTH 2 (TWO) TIMES DAILY., Disp: 60 tablet, Rfl: 0 .  esomeprazole (NEXIUM) 40 MG capsule, TAKE  1 CAPSULE (40 MG TOTAL) BY MOUTH DAILY., Disp: 90 capsule, Rfl: 0 .  Lancets 30G MISC, , Disp: , Rfl: 12 .  metFORMIN (GLUCOPHAGE) 1000 MG tablet, TAKE 1 TABLET (1,000 MG TOTAL) BY MOUTH 2 (TWO) TIMES DAILY WITH A MEAL., Disp: 180 tablet, Rfl: 0 .  Multiple Vitamin (MULTI VITAMIN DAILY PO), Take by mouth., Disp: , Rfl:  .  Naltrexone-Bupropion HCl ER (CONTRAVE) 8-90 MG TB12, Take 2 tablets by mouth 2 (two) times daily. Start 1 tab po qAM x 1wk, then 1 tab po bid x 1 wk, then 2 tabs po qAM and 1 tab po qpm x 1 wk. (Patient not taking: Reported on 01/23/2017), Disp:  360 tablet, Rfl: 0 .  ONE TOUCH ULTRA TEST test strip, USE TO TEST SUGAR 3 TIMES A DAY, Disp: , Rfl: 12 .  sildenafil (VIAGRA) 50 MG tablet, Take 1 tablet (50 mg total) by mouth daily as needed for erectile dysfunction. (Patient not taking: Reported on 01/23/2017), Disp: 10 tablet, Rfl: 0 .  valACYclovir (VALTREX) 1000 MG tablet, Take 1 tablet (1,000 mg total) by mouth 2 (two) times daily., Disp: 20 tablet, Rfl: 0 .  VICTOZA 18 MG/3ML SOPN, INJECT 0.6 MG SUBCUTANEOUSLY DAILY FOR 1 WEEK, THEN 1.2 MG FOR 1 WEEK, THEN 1.8 MG DAILY THERAFTER, Disp: 5 pen, Rfl: 2  No Known Allergies   Review of Systems  Constitutional: Negative for fatigue.  Respiratory: Negative for shortness of breath.   Musculoskeletal: Negative for myalgias.  Neurological: Negative for dizziness and headaches.  Endo/Heme/Allergies: Negative for polydipsia.  Psychiatric/Behavioral: The patient has insomnia. The patient is not nervous/anxious.       Objective  Vitals:   05/02/17 0858  BP: 118/64  Pulse: 88  Resp: 18  Temp: (!) 97.3 F (36.3 C)  SpO2: 96%  Weight: 240 lb 2 oz (108.9 kg)  Height: 5' 8" (1.727 m)    Physical Exam  Constitutional: He is oriented to person, place, and time and well-developed, well-nourished, and in no distress.  Cardiovascular: Normal rate, regular rhythm and normal heart sounds.   No murmur heard. Pulmonary/Chest: Effort normal and breath sounds normal. He has no wheezes.  Abdominal: Soft. Bowel sounds are normal. There is no tenderness.  Musculoskeletal:       Left foot: There is tenderness and bony tenderness. There is no swelling and normal capillary refill.       Feet:  Neurological: He is alert and oriented to person, place, and time.  Nursing note and vitals reviewed.    Assessment & Plan  1. Controlled type 2 diabetes mellitus without complication, without long-term current use of insulin (HCC) Point-of-care A1c 6.5%, well-controlled diabetes. Continue on Victoza,  recheck in 3 months - POCT HgB A1C  2. Left foot pain Suspect overuse injury, obtain x-rays to rule out acute bony pathology - DG Foot Complete Left; Future  3. Elevated liver enzymes Likely because of fatty liver, repeat levels and consider repeat ultrasound - Hepatic function panel  4. Anxiety Symptoms of anxiety are stable and controlled on alprazolam - ALPRAZolam (XANAX) 0.5 MG tablet; Take 1 tablet (0.5 mg total) by mouth 3 (three) times daily as needed.  Dispense: 90 tablet; Refill: 0  5. Pure hypercholesterolemia  - Lipid panel   Kaleia Longhi [facility] 05/02/2017 9:05 AM

## 2017-05-05 ENCOUNTER — Telehealth: Payer: Self-pay

## 2017-05-05 NOTE — Telephone Encounter (Signed)
Dr. Sherryll BurgerShah, pt would like a return call from you concerning x-ray and wants to know what is the next step or what can he do to alleviate the pain, pls return call

## 2017-05-06 NOTE — Telephone Encounter (Signed)
X-ray shows some degenerative changes in his left foot, next step is to refer him for evaluation by a podiatrist. Please confirm and we'll place a referral.

## 2017-05-12 ENCOUNTER — Ambulatory Visit: Payer: BLUE CROSS/BLUE SHIELD | Admitting: Family Medicine

## 2017-05-19 ENCOUNTER — Other Ambulatory Visit: Payer: Self-pay | Admitting: Family Medicine

## 2017-05-19 DIAGNOSIS — M47812 Spondylosis without myelopathy or radiculopathy, cervical region: Secondary | ICD-10-CM

## 2017-05-19 NOTE — Telephone Encounter (Signed)
Rx for NSAID declined, LFTs elevated; needs to see Dr. Sherryll BurgerShah

## 2017-05-27 ENCOUNTER — Ambulatory Visit (INDEPENDENT_AMBULATORY_CARE_PROVIDER_SITE_OTHER): Payer: BLUE CROSS/BLUE SHIELD | Admitting: Family Medicine

## 2017-05-27 ENCOUNTER — Encounter: Payer: Self-pay | Admitting: Family Medicine

## 2017-05-27 VITALS — BP 120/68 | HR 87 | Temp 98.4°F | Resp 17 | Ht 68.0 in | Wt 241.4 lb

## 2017-05-27 DIAGNOSIS — R748 Abnormal levels of other serum enzymes: Secondary | ICD-10-CM

## 2017-05-27 LAB — HEPATITIS PANEL, ACUTE
HCV Ab: NONREACTIVE
HEP B C IGM: NONREACTIVE
Hep A IgM: NONREACTIVE
Hepatitis B Surface Ag: NONREACTIVE

## 2017-05-27 NOTE — Progress Notes (Signed)
Name: Trevor Dennis.   MRN: [id number]    DOB: [birth year]   Date:05/27/2017       Progress Note  Subjective  Chief Complaint  Chief Complaint  Patient presents with  . Follow-up    discuss liver enzymes    HPI  Elevated Liver Enzymes:  Patient presents for workup of transaminitis his AST and ALTs obtained in July 2018 were elevated at 53 and 107 respectively, this is consistent with the overall trend of persistently elevated liver enzymes. He has had an abdominal ultrasound in May 2014 which showed fatty liver. Schendt is considered obese by BMI, has diabetes, admits to drinking alcohol socially every other weekend. He denies taking anything hepatotoxic drugs.   Past Medical History:  Diagnosis Date  . Anxiety   . Arthritis   . Diabetes mellitus without complication [facility])     Past Surgical History:  Procedure Laterality Date  . BACK SURGERY  8 years ago    Family History  Problem Relation Age of Onset  . Diabetes Mother   . Liver disease Brother     Social History   Social History  . Marital status: Married    Spouse name: N/A  . Number of children: N/A  . Years of education: N/A   Occupational History  . Not on file.   Social History Main Topics  . Smoking status: Current Some Day Smoker    Packs/day: 1.00    Types: Cigarettes  . Smokeless tobacco: Never Used     Comment: social smoker like 1 some days  . Alcohol use 0.0 oz/week     Comment: Occasional   . Drug use: No  . Sexual activity: Yes    Partners: Female   Other Topics Concern  . Not on file   Social History Narrative  . No narrative on file     Current Outpatient Prescriptions:  .  ALPRAZolam (XANAX) 0.5 MG tablet, Take 1 tablet (0.5 mg total) by mouth 3 (three) times daily as needed., Disp: 90 tablet, Rfl: 0 .  Blood Glucose Monitoring Suppl (ONE TOUCH ULTRA MINI) W/DEVICE KIT, See admin instructions., Disp: , Rfl: 0 .  diclofenac (VOLTAREN) 75 MG EC tablet, TAKE 1 TABLET (75 MG  TOTAL) BY MOUTH 2 (TWO) TIMES DAILY., Disp: 60 tablet, Rfl: 0 .  esomeprazole (NEXIUM) 40 MG capsule, TAKE 1 CAPSULE (40 MG TOTAL) BY MOUTH DAILY., Disp: 90 capsule, Rfl: 0 .  Lancets 30G MISC, , Disp: , Rfl: 12 .  metFORMIN (GLUCOPHAGE) 1000 MG tablet, TAKE 1 TABLET (1,000 MG TOTAL) BY MOUTH 2 (TWO) TIMES DAILY WITH A MEAL., Disp: 180 tablet, Rfl: 0 .  Multiple Vitamin (MULTI VITAMIN DAILY PO), Take by mouth., Disp: , Rfl:  .  ONE TOUCH ULTRA TEST test strip, USE TO TEST SUGAR 3 TIMES A DAY, Disp: , Rfl: 12 .  sildenafil (VIAGRA) 50 MG tablet, Take 1 tablet (50 mg total) by mouth daily as needed for erectile dysfunction., Disp: 10 tablet, Rfl: 0 .  valACYclovir (VALTREX) 1000 MG tablet, Take 1 tablet (1,000 mg total) by mouth 2 (two) times daily., Disp: 20 tablet, Rfl: 0 .  VICTOZA 18 MG/3ML SOPN, INJECT 0.6 MG SUBCUTANEOUSLY DAILY FOR 1 WEEK, THEN 1.2 MG FOR 1 WEEK, THEN 1.8 MG DAILY THERAFTER, Disp: 5 pen, Rfl: 2 .  atorvastatin (LIPITOR) 10 MG tablet, TAKE 1 TABLET (10 MG TOTAL) BY MOUTH DAILY. (Patient not taking: Reported on 05/27/2017), Disp: 90 tablet, Rfl: 0 .  Naltrexone-Bupropion  HCl ER (CONTRAVE) 8-90 MG TB12, Take 2 tablets by mouth 2 (two) times daily. Start 1 tab po qAM x 1wk, then 1 tab po bid x 1 wk, then 2 tabs po qAM and 1 tab po qpm x 1 wk. (Patient not taking: Reported on 05/27/2017), Disp: 360 tablet, Rfl: 0  No Known Allergies   ROS  Please see history of present illness for complete discussion of ROS  Objective  Vitals:   05/27/17 0943  BP: 120/68  Pulse: 87  Resp: 17  Temp: 98.4 F (36.9 C)  TempSrc: Oral  SpO2: 96%  Weight: 241 lb 6.4 oz (109.5 kg)  Height: '5\' 8"'  (1.727 m)    Physical Exam  Constitutional: He is oriented to person, place, and time and well-developed, well-nourished, and in no distress.  Eyes: Pupils are equal, round, and reactive to light. Conjunctivae are normal.  Cardiovascular: Normal rate, regular rhythm, S1 normal, S2 normal and  normal heart sounds.   No murmur heard. Pulmonary/Chest: Effort normal and breath sounds normal. He has no wheezes.  Abdominal: Soft. Bowel sounds are normal. There is no tenderness.  Musculoskeletal:       Right ankle: He exhibits no swelling.       Left ankle: He exhibits no swelling.  Neurological: He is alert and oriented to person, place, and time.  Nursing note and vitals reviewed.     Assessment & Plan  1. Elevated liver enzymes Obtain workup for abnormal liver enzymes including GGT, acute hepatitis, ANA and ceruloplasmin levels, obtain right upper quadrant ultrasound to monitor possible progression of fatty liver disease. Statins and testosterone are on hold until further instructions. Reassess in 2-3 weeks - Hepatic function panel - Gamma GT - Hepatitis, Acute - US Abdomen Limited RUQ; Future - ANA - Ceruloplasmin   Rushawn Capshaw Asad A[facility] 05/27/2017 9:48 AM

## 2017-05-28 LAB — ANA: Anti Nuclear Antibody(ANA): NEGATIVE

## 2017-05-28 LAB — HEPATIC FUNCTION PANEL
ALBUMIN: 4.5 g/dL (ref 3.6–5.1)
ALT: 83 U/L — ABNORMAL HIGH (ref 9–46)
AST: 44 U/L — AB (ref 10–40)
Alkaline Phosphatase: 43 U/L (ref 40–115)
BILIRUBIN TOTAL: 0.4 mg/dL (ref 0.2–1.2)
Bilirubin, Direct: 0.1 mg/dL (ref <=0.2)
Indirect Bilirubin: 0.3 mg/dL (ref 0.2–1.2)
Total Protein: 7.2 g/dL (ref 6.1–8.1)

## 2017-05-28 LAB — GAMMA GT: GGT: 107 U/L — AB (ref 3–95)

## 2017-05-29 LAB — CERULOPLASMIN: CERULOPLASMIN: 21 mg/dL (ref 18–36)

## 2017-06-03 ENCOUNTER — Ambulatory Visit
Admission: RE | Admit: 2017-06-03 | Discharge: 2017-06-03 | Disposition: A | Discharge status: Home / Self Care | Payer: BLUE CROSS/BLUE SHIELD | Source: Ambulatory Visit | Attending: Family Medicine | Admitting: Family Medicine

## 2017-06-03 DIAGNOSIS — R748 Abnormal levels of other serum enzymes: Secondary | ICD-10-CM

## 2017-06-03 DIAGNOSIS — K760 Fatty (change of) liver, not elsewhere classified: Secondary | ICD-10-CM | POA: Insufficient documentation

## 2017-06-09 ENCOUNTER — Encounter: Payer: Self-pay | Admitting: Family Medicine

## 2017-06-09 ENCOUNTER — Ambulatory Visit (INDEPENDENT_AMBULATORY_CARE_PROVIDER_SITE_OTHER): Payer: BLUE CROSS/BLUE SHIELD | Admitting: Family Medicine

## 2017-06-09 VITALS — BP 120/69 | HR 90 | Temp 98.3°F | Resp 17 | Ht 68.0 in | Wt 244.5 lb

## 2017-06-09 DIAGNOSIS — G4700 Insomnia, unspecified: Secondary | ICD-10-CM | POA: Diagnosis not present

## 2017-06-09 DIAGNOSIS — Z0000 Encounter for general adult medical examination without abnormal findings: Secondary | ICD-10-CM

## 2017-06-09 LAB — CBC WITH DIFFERENTIAL/PLATELET
BASOS ABS: 101 {cells}/uL (ref 0–200)
BASOS PCT: 1 %
EOS ABS: 404 {cells}/uL (ref 15–500)
EOS PCT: 4 %
HCT: 39.5 % (ref 38.5–50.0)
Hemoglobin: 13 g/dL — ABNORMAL LOW (ref 13.2–17.1)
LYMPHS ABS: 3434 {cells}/uL (ref 850–3900)
Lymphocytes Relative: 34 %
MCH: 30.9 pg (ref 27.0–33.0)
MCHC: 32.9 g/dL (ref 32.0–36.0)
MCV: 93.8 fL (ref 80.0–100.0)
MONOS PCT: 5 %
MPV: 11.4 fL (ref 7.5–12.5)
Monocytes Absolute: 505 cells/uL (ref 200–950)
NEUTROS ABS: 5656 {cells}/uL (ref 1500–7800)
Neutrophils Relative %: 56 %
PLATELETS: 259 10*3/uL (ref 140–400)
RBC: 4.21 MIL/uL (ref 4.20–5.80)
RDW: 13.6 % (ref 11.0–15.0)
WBC: 10.1 10*3/uL (ref 3.8–10.8)

## 2017-06-09 NOTE — Progress Notes (Signed)
Name: Trevor Dennis.   MRN: [id number]    DOB: August 18, 1971   Date:06/09/2017       Progress Note  Subjective  Chief Complaint  Chief Complaint  Patient presents with  . Annual Exam    CPE    HPI  Pt. Presents for complete physical exam.  He is not due for colon cancer screening or prostate cancer screening.     Past Medical History:  Diagnosis Date  . Anxiety   . Arthritis   . Diabetes mellitus without complication [facility])     Past Surgical History:  Procedure Laterality Date  . BACK SURGERY  8 years ago    Family History  Problem Relation Age of Onset  . Diabetes Mother   . Liver disease Brother     Social History   Social History  . Marital status: Divorced    Spouse name: N/A  . Number of children: N/A  . Years of education: N/A   Occupational History  . Not on file.   Social History Main Topics  . Smoking status: Current Some Day Smoker    Packs/day: 1.00    Types: Cigarettes  . Smokeless tobacco: Never Used     Comment: social smoker like 1 some days  . Alcohol use 0.0 oz/week     Comment: Occasional   . Drug use: No  . Sexual activity: Yes    Partners: Female   Other Topics Concern  . Not on file   Social History Narrative  . No narrative on file     Current Outpatient Prescriptions:  .  ALPRAZolam (XANAX) 0.5 MG tablet, Take 1 tablet (0.5 mg total) by mouth 3 (three) times daily as needed., Disp: 90 tablet, Rfl: 0 .  atorvastatin (LIPITOR) 10 MG tablet, TAKE 1 TABLET (10 MG TOTAL) BY MOUTH DAILY., Disp: 90 tablet, Rfl: 0 .  Blood Glucose Monitoring Suppl (ONE TOUCH ULTRA MINI) W/DEVICE KIT, See admin instructions., Disp: , Rfl: 0 .  diclofenac (VOLTAREN) 75 MG EC tablet, TAKE 1 TABLET (75 MG TOTAL) BY MOUTH 2 (TWO) TIMES DAILY., Disp: 60 tablet, Rfl: 0 .  esomeprazole (NEXIUM) 40 MG capsule, TAKE 1 CAPSULE (40 MG TOTAL) BY MOUTH DAILY., Disp: 90 capsule, Rfl: 0 .  Lancets 30G MISC, , Disp: , Rfl: 12 .  metFORMIN (GLUCOPHAGE) 1000 MG  tablet, TAKE 1 TABLET (1,000 MG TOTAL) BY MOUTH 2 (TWO) TIMES DAILY WITH A MEAL., Disp: 180 tablet, Rfl: 0 .  Multiple Vitamin (MULTI VITAMIN DAILY PO), Take by mouth., Disp: , Rfl:  .  Naltrexone-Bupropion HCl ER (CONTRAVE) 8-90 MG TB12, Take 2 tablets by mouth 2 (two) times daily. Start 1 tab po qAM x 1wk, then 1 tab po bid x 1 wk, then 2 tabs po qAM and 1 tab po qpm x 1 wk., Disp: 360 tablet, Rfl: 0 .  ONE TOUCH ULTRA TEST test strip, USE TO TEST SUGAR 3 TIMES A DAY, Disp: , Rfl: 12 .  sildenafil (VIAGRA) 50 MG tablet, Take 1 tablet (50 mg total) by mouth daily as needed for erectile dysfunction., Disp: 10 tablet, Rfl: 0 .  valACYclovir (VALTREX) 1000 MG tablet, Take 1 tablet (1,000 mg total) by mouth 2 (two) times daily., Disp: 20 tablet, Rfl: 0 .  VICTOZA 18 MG/3ML SOPN, INJECT 0.6 MG SUBCUTANEOUSLY DAILY FOR 1 WEEK, THEN 1.2 MG FOR 1 WEEK, THEN 1.8 MG DAILY THERAFTER, Disp: 5 pen, Rfl: 2  No Known Allergies   Review of Systems  Constitutional: Positive for malaise/fatigue. Negative for chills and fever.  HENT: Negative for congestion and sore throat.   Eyes: Negative for blurred vision and double vision.  Respiratory: Negative for cough and shortness of breath.   Cardiovascular: Negative for chest pain and leg swelling (occasional feet and ankle swelling).  Gastrointestinal: Negative for abdominal pain, blood in stool, constipation, diarrhea, nausea and vomiting.  Genitourinary: Negative for hematuria.  Musculoskeletal: Positive for back pain and neck pain.  Neurological: Negative for dizziness and headaches.  Psychiatric/Behavioral: Negative for depression. The patient has insomnia (has difficulty staying asleep, feels tired during the day). The patient is not nervous/anxious.     Objective  Vitals:   06/09/17 1415  BP: 120/69  Pulse: 90  Resp: 17  Temp: 98.3 F (36.8 C)  TempSrc: Oral  SpO2: 96%  Weight: 244 lb 8 oz (110.9 kg)  Height: '5\' 8"'  (1.727 m)    Physical Exam   Constitutional: He is oriented to person, place, and time and well-developed, well-nourished, and in no distress.  HENT:  Head: Normocephalic and atraumatic.  Right Ear: External ear normal.  Left Ear: External ear normal.  Mouth/Throat: Oropharynx is clear and moist.  Eyes: Pupils are equal, round, and reactive to light. Conjunctivae are normal.  Cardiovascular: Normal rate, regular rhythm and normal heart sounds.   No murmur heard. Pulmonary/Chest: Effort normal and breath sounds normal. He has no wheezes.  Abdominal: Soft. Bowel sounds are normal. There is no tenderness.  Musculoskeletal: He exhibits no edema.  Neurological: He is alert and oriented to person, place, and time.  Psychiatric: Mood, memory, affect and judgment normal.  Nursing note and vitals reviewed.      Assessment & Plan  1. Annual physical exam Obtain age-appropriate laboratory screenings - CBC with Differential/Platelet - TSH - VITAMIN D 25 Hydroxy (Vit-D Deficiency, Fractures)  2. Insomnia, unspecified type Suspect obstructive sleep apnea as patient is obese and has unrefreshing sleep, referral to sleep medicine - Ambulatory referral to Sleep Studies   Geovanna Simko [facility] 06/09/2017 2:31 PM

## 2017-06-10 LAB — VITAMIN D 25 HYDROXY (VIT D DEFICIENCY, FRACTURES): Vit D, 25-Hydroxy: 26 ng/mL — ABNORMAL LOW (ref 30–100)

## 2017-06-10 LAB — TSH: TSH: 2.57 m[IU]/L (ref 0.40–4.50)

## 2017-06-11 ENCOUNTER — Telehealth: Payer: Self-pay

## 2017-06-11 MED ORDER — VITAMIN D (ERGOCALCIFEROL) 1.25 MG (50000 UNIT) PO CAPS: 50000.0000 [IU] | ORAL_CAPSULE | ORAL | 0 refills | Status: DC

## 2017-06-11 NOTE — Telephone Encounter (Signed)
Patient has been notified of lab results and a prescription for vitamin D3 50,000 units take 1 capsule once a week x12 weeks has been sent to [facility] [address] per Dr. Sherryll Burger, patient verbalized understanding

## 2017-07-13 ENCOUNTER — Other Ambulatory Visit: Payer: Self-pay | Admitting: Family Medicine

## 2017-07-13 DIAGNOSIS — K219 Gastro-esophageal reflux disease without esophagitis: Secondary | ICD-10-CM

## 2017-07-25 ENCOUNTER — Ambulatory Visit (INDEPENDENT_AMBULATORY_CARE_PROVIDER_SITE_OTHER): Payer: BLUE CROSS/BLUE SHIELD | Admitting: Family Medicine

## 2017-08-25 ENCOUNTER — Other Ambulatory Visit: Payer: Self-pay

## 2017-08-25 DIAGNOSIS — E119 Type 2 diabetes mellitus without complications: Secondary | ICD-10-CM

## 2017-08-26 ENCOUNTER — Ambulatory Visit: Payer: BLUE CROSS/BLUE SHIELD | Admitting: Family Medicine

## 2017-08-26 ENCOUNTER — Encounter: Payer: Self-pay | Admitting: Family Medicine

## 2017-08-26 VITALS — BP 124/86 | HR 87 | Temp 97.9°F | Resp 16 | Ht 68.0 in | Wt 236.8 lb

## 2017-08-26 DIAGNOSIS — E559 Vitamin D deficiency, unspecified: Secondary | ICD-10-CM | POA: Diagnosis not present

## 2017-08-26 DIAGNOSIS — S069X2D Unspecified intracranial injury with loss of consciousness of 31 minutes to 59 minutes, subsequent encounter: Secondary | ICD-10-CM

## 2017-08-26 DIAGNOSIS — E7800 Pure hypercholesterolemia, unspecified: Secondary | ICD-10-CM

## 2017-08-26 DIAGNOSIS — R748 Abnormal levels of other serum enzymes: Secondary | ICD-10-CM | POA: Diagnosis not present

## 2017-08-26 DIAGNOSIS — E119 Type 2 diabetes mellitus without complications: Secondary | ICD-10-CM

## 2017-08-26 LAB — COMPLETE METABOLIC PANEL WITH GFR
AG RATIO: 1.6 (calc) (ref 1.0–2.5)
ALT: 92 U/L — AB (ref 9–46)
AST: 43 U/L — AB (ref 10–40)
Albumin: 4.9 g/dL (ref 3.6–5.1)
Alkaline phosphatase (APISO): 46 U/L (ref 40–115)
BILIRUBIN TOTAL: 0.5 mg/dL (ref 0.2–1.2)
BUN: 16 mg/dL (ref 7–25)
CHLORIDE: 102 mmol/L (ref 98–110)
CO2: 28 mmol/L (ref 20–32)
Calcium: 10.3 mg/dL (ref 8.6–10.3)
Creat: 0.94 mg/dL (ref 0.60–1.35)
GFR, EST AFRICAN AMERICAN: 114 mL/min/{1.73_m2} (ref >=60)
GFR, Est Non African American: 98 mL/min/{1.73_m2} (ref >=60)
GLUCOSE: 146 mg/dL — AB (ref 65–99)
Globulin: 3.1 g/dL (calc) (ref 1.9–3.7)
POTASSIUM: 5 mmol/L (ref 3.5–5.3)
Sodium: 137 mmol/L (ref 135–146)
TOTAL PROTEIN: 8 g/dL (ref 6.1–8.1)

## 2017-08-26 LAB — LIPID PANEL
CHOLESTEROL: 216 mg/dL — AB (ref <200)
HDL: 56 mg/dL (ref >40)
LDL CHOLESTEROL (CALC): 138 mg/dL — AB
Non-HDL Cholesterol (Calc): 160 mg/dL (calc) — ABNORMAL HIGH (ref <130)
TRIGLYCERIDES: 102 mg/dL (ref <150)
Total CHOL/HDL Ratio: 3.9 (calc) (ref <5.0)

## 2017-08-26 LAB — POCT GLYCOSYLATED HEMOGLOBIN (HGB A1C): HEMOGLOBIN A1C: 6.8

## 2017-08-26 LAB — GLUCOSE, POCT (MANUAL RESULT ENTRY): POC GLUCOSE: 137 mg/dL — AB (ref 70–99)

## 2017-08-26 NOTE — Progress Notes (Signed)
The following letter was provided to Ocie CornfieldHoward T Jurgens Jr. during their visit today: ---------------------------------------  Dear [facility],  I am writing to share that as of November 28, 2017, I will no longer be seeing patients at [facility]. While it has been my privilege to care for you as a physician, I have decided to move outside of [place] to pursue other opportunities.  The staff at [facility] has been supportive of my decision and are supportive of any patients who have been under my care.  They will be happy to provide care to you and your family.  The office staff will do everything they can to ensure a seamless transition of care at [facility].  However if you are on any controlled substance medications (i.e. Pain medication or benzodiazepines), they will no longer be able to refill those medications, but we will be more than happy to refer you to a specialist.  [facility] will also assist you with the transfer of medical records should you wish to seek care elsewhere.  If you have any questions about your future care, you may call the office at [phone].  I have enjoyed getting to know my patients here and I wish you the very best.  Sincerely,  Velta AddisonSyed Asad Shah, MD  ---------------------------------------  A written copy of this letter was given to the patient.  The patient verbalizes understanding of the letter, and does request referral to specialist or to new primary care provider.  This decision has been conveyed to Dr. Sherryll BurgerShah, who will place any appropriate referrals during today's visit.

## 2017-08-26 NOTE — Progress Notes (Signed)
Name: Trevor Dennis.   MRN: [id number]    DOB: 1970-11-09   Date:08/26/2017       Progress Note  Subjective  Chief Complaint  Chief Complaint  Patient presents with  . Diabetes    f/u  . Hyperlipidemia    f/u with fasting labs    Diabetes  He presents for his follow-up diabetic visit. He has type 2 diabetes mellitus. His disease course has been stable. There are no hypoglycemic associated symptoms. Pertinent negatives for hypoglycemia include no dizziness, headaches or sweats. Pertinent negatives for diabetes include no chest pain, no fatigue, no foot paresthesias, no polydipsia and no polyuria. Pertinent negatives for diabetic complications include no CVA, heart disease or peripheral neuropathy. Current diabetic treatment includes diet and oral agent (dual therapy). His weight is stable. He is following a diabetic and generally healthy diet. He monitors blood glucose at home 1-2 x per day. His breakfast blood glucose range is generally 130-140 mg/dl.  Hyperlipidemia  This is a chronic problem. The problem is uncontrolled. Recent lipid tests were reviewed and are high. Pertinent negatives include no chest pain, leg pain, myalgias or shortness of breath. Current antihyperlipidemic treatment includes statins.   Traumatic Brain Injury: Pt. fell down from a ladder 10-15 feet onto the ground, sustained a subarachnoid hemorrhage, initially evaluated at [facility], Neurosurgery was consulted, recommended no intervention, started on 7 days of Keppra 500 mg BID. He feels back to baseline, has some dizziness. He is communicating normally, recent and remote memory seems to be intact.     Past Medical History:  Diagnosis Date  . Anxiety   . Arthritis   . Diabetes mellitus without complication [facility])     Past Surgical History:  Procedure Laterality Date  . BACK SURGERY  8 years ago    Family History  Problem Relation Age of Onset  . Diabetes Mother   . Liver disease Brother     Social  History   Socioeconomic History  . Marital status: Divorced    Spouse name: Not on file  . Number of children: Not on file  . Years of education: Not on file  . Highest education level: Not on file  Social Needs  . Financial resource strain: Not on file  . Food insecurity - worry: Not on file  . Food insecurity - inability: Not on file  . Transportation needs - medical: Not on file  . Transportation needs - non-medical: Not on file  Occupational History  . Not on file  Tobacco Use  . Smoking status: Current Some Day Smoker    Packs/day: 1.00    Types: Cigarettes  . Smokeless tobacco: Never Used  . Tobacco comment: social smoker like 1 some days  Substance and Sexual Activity  . Alcohol use: Yes    Alcohol/week: 0.0 oz    Comment: Occasional   . Drug use: No  . Sexual activity: Yes    Partners: Female  Other Topics Concern  . Not on file  Social History Narrative  . Not on file     Current Outpatient Medications:  .  ALPRAZolam (XANAX) 0.5 MG tablet, Take 1 tablet (0.5 mg total) by mouth 3 (three) times daily as needed., Disp: 90 tablet, Rfl: 0 .  Blood Glucose Monitoring Suppl (ONE TOUCH ULTRA MINI) W/DEVICE KIT, See admin instructions., Disp: , Rfl: 0 .  diclofenac (VOLTAREN) 75 MG EC tablet, TAKE 1 TABLET (75 MG TOTAL) BY MOUTH 2 (TWO) TIMES DAILY., Disp: 60 tablet,  Rfl: 0 .  Lancets 30G MISC, , Disp: , Rfl: 12 .  metFORMIN (GLUCOPHAGE) 1000 MG tablet, TAKE 1 TABLET (1,000 MG TOTAL) BY MOUTH 2 (TWO) TIMES DAILY WITH A MEAL. (Patient taking differently: TAKE 1 TABLET (1,000 MG TOTAL) BY MOUTH 1 (one) TIMES DAILY WITH A MEAL.), Disp: 180 tablet, Rfl: 0 .  Multiple Vitamin (MULTI VITAMIN DAILY PO), Take by mouth., Disp: , Rfl:  .  Naltrexone-Bupropion HCl ER (CONTRAVE) 8-90 MG TB12, Take 2 tablets by mouth 2 (two) times daily. Start 1 tab po qAM x 1wk, then 1 tab po bid x 1 wk, then 2 tabs po qAM and 1 tab po qpm x 1 wk., Disp: 360 tablet, Rfl: 0 .  ONE TOUCH ULTRA TEST  test strip, USE TO TEST SUGAR 3 TIMES A DAY, Disp: , Rfl: 12 .  sildenafil (VIAGRA) 50 MG tablet, Take 1 tablet (50 mg total) by mouth daily as needed for erectile dysfunction., Disp: 10 tablet, Rfl: 0 .  valACYclovir (VALTREX) 1000 MG tablet, Take 1 tablet (1,000 mg total) by mouth 2 (two) times daily., Disp: 20 tablet, Rfl: 0 .  VICTOZA 18 MG/3ML SOPN, INJECT 0.6 MG SUBCUTANEOUSLY DAILY FOR 1 WEEK, THEN 1.2 MG FOR 1 WEEK, THEN 1.8 MG DAILY THERAFTER, Disp: 5 pen, Rfl: 2 .  Vitamin D, Ergocalciferol, (DRISDOL) 50000 units CAPS capsule, Take 1 capsule (50,000 Units total) by mouth once a week. For 12 weeks, Disp: 12 capsule, Rfl: 0 .  atorvastatin (LIPITOR) 10 MG tablet, TAKE 1 TABLET (10 MG TOTAL) BY MOUTH DAILY. (Patient not taking: Reported on 08/26/2017), Disp: 90 tablet, Rfl: 0 .  esomeprazole (NEXIUM) 40 MG capsule, TAKE 1 CAPSULE (40 MG TOTAL) BY MOUTH DAILY. (Patient not taking: Reported on 08/26/2017), Disp: 90 capsule, Rfl: 0  No Known Allergies   Review of Systems  Constitutional: Negative for fatigue.  Respiratory: Negative for shortness of breath.   Cardiovascular: Negative for chest pain.  Musculoskeletal: Negative for myalgias.  Neurological: Negative for dizziness and headaches.  Endo/Heme/Allergies: Negative for polydipsia.    Objective  Vitals:   08/26/17 0828  BP: 124/86  Pulse: 87  Resp: 16  Temp: 97.9 F (36.6 C)  TempSrc: Oral  SpO2: 98%  Weight: 236 lb 12.8 oz (107.4 kg)  Height: '5\' 8"'  (1.727 m)    Physical Exam  Constitutional: He is oriented to person, place, and time and well-developed, well-nourished, and in no distress.  HENT:  Head: Normocephalic and atraumatic.  Eyes: Pupils are equal, round, and reactive to light.  Cardiovascular: Normal rate, regular rhythm and normal heart sounds.  No murmur heard. Pulmonary/Chest: Effort normal and breath sounds normal. He has no wheezes.  Musculoskeletal: He exhibits no edema.  Neurological: He is alert  and oriented to person, place, and time.  Psychiatric: Mood, memory, affect and judgment normal.  Nursing note and vitals reviewed.     Assessment & Plan  1. Controlled type 2 diabetes mellitus without complication, without long-term current use of insulin (HCC) Point-of-care A1c 6.8%, well-controlled Diabetes - POCT Glucose (CBG) - POCT HgB A1C  2. Pure hypercholesterolemia Currently not on statin because of elevated liver enzymes, recheck FLP and liver enzymes.  - Lipid panel  3. Elevated liver enzymes As above. Check liver enzymes and consider restarting statin. - COMPLETE METABOLIC PANEL WITH GFR  4. 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  - Ambulatory referral to Neurology  5. Vitamin D insufficiency  - VITAMIN D 25 Hydroxy (Vit-D Deficiency, Fractures)     Annalysse Shoemaker [facility] 08/26/2017 8:38 AM

## 2017-08-27 LAB — VITAMIN D 25 HYDROXY (VIT D DEFICIENCY, FRACTURES): Vit D, 25-Hydroxy: 44 ng/mL (ref 30–100)

## 2017-08-28 ENCOUNTER — Other Ambulatory Visit: Payer: Self-pay

## 2017-08-29 ENCOUNTER — Other Ambulatory Visit: Payer: Self-pay

## 2017-08-29 DIAGNOSIS — E7800 Pure hypercholesterolemia, unspecified: Secondary | ICD-10-CM

## 2017-08-29 DIAGNOSIS — E119 Type 2 diabetes mellitus without complications: Secondary | ICD-10-CM

## 2017-08-29 MED ORDER — ATORVASTATIN CALCIUM 10 MG PO TABS: 10.0000 mg | ORAL_TABLET | Freq: Every day | ORAL | 0 refills | Status: DC

## 2017-08-29 NOTE — Progress Notes (Signed)
Notes recorded by Ellyn HackShah, Syed Asad A, MD on 08/28/2017 at 5:40 PM EST He can be started on atorvastatin 10 mg at bedtime #90. He should return in 6 weeks after he starts taking atorvastatin to repeat liver enzymes.    RX has been order and sent to the pt local pharmacy, request by Dr. Sherryll BurgerShah. Pt has been notified.

## 2017-09-02 ENCOUNTER — Other Ambulatory Visit: Payer: Self-pay | Admitting: Family Medicine

## 2017-09-17 DIAGNOSIS — Z8679 Personal history of other diseases of the circulatory system: Secondary | ICD-10-CM | POA: Insufficient documentation

## 2017-09-17 DIAGNOSIS — F0781 Postconcussional syndrome: Secondary | ICD-10-CM | POA: Insufficient documentation

## 2017-09-23 ENCOUNTER — Other Ambulatory Visit: Payer: Self-pay | Admitting: Family Medicine

## 2017-09-25 ENCOUNTER — Other Ambulatory Visit: Payer: Self-pay | Admitting: Family Medicine

## 2017-09-25 DIAGNOSIS — K219 Gastro-esophageal reflux disease without esophagitis: Secondary | ICD-10-CM

## 2017-11-26 ENCOUNTER — Ambulatory Visit: Payer: BLUE CROSS/BLUE SHIELD | Admitting: Family Medicine

## 2017-11-26 ENCOUNTER — Encounter: Payer: Self-pay | Admitting: Family Medicine

## 2017-11-26 VITALS — BP 126/82 | HR 98 | Temp 97.9°F | Wt 245.5 lb

## 2017-11-26 DIAGNOSIS — R350 Frequency of micturition: Secondary | ICD-10-CM | POA: Insufficient documentation

## 2017-11-26 DIAGNOSIS — K219 Gastro-esophageal reflux disease without esophagitis: Secondary | ICD-10-CM | POA: Diagnosis not present

## 2017-11-26 DIAGNOSIS — M47812 Spondylosis without myelopathy or radiculopathy, cervical region: Secondary | ICD-10-CM | POA: Diagnosis not present

## 2017-11-26 DIAGNOSIS — E119 Type 2 diabetes mellitus without complications: Secondary | ICD-10-CM | POA: Diagnosis not present

## 2017-11-26 DIAGNOSIS — E7800 Pure hypercholesterolemia, unspecified: Secondary | ICD-10-CM

## 2017-11-26 DIAGNOSIS — E349 Endocrine disorder, unspecified: Secondary | ICD-10-CM | POA: Insufficient documentation

## 2017-11-26 DIAGNOSIS — F419 Anxiety disorder, unspecified: Secondary | ICD-10-CM | POA: Diagnosis not present

## 2017-11-26 DIAGNOSIS — R5383 Other fatigue: Secondary | ICD-10-CM | POA: Insufficient documentation

## 2017-11-26 LAB — COMPLETE METABOLIC PANEL WITH GFR
AG RATIO: 1.5 (calc) (ref 1.0–2.5)
ALBUMIN MSPROF: 4.8 g/dL (ref 3.6–5.1)
ALT: 104 U/L — AB (ref 9–46)
AST: 68 U/L — ABNORMAL HIGH (ref 10–40)
Alkaline phosphatase (APISO): 53 U/L (ref 40–115)
BILIRUBIN TOTAL: 0.6 mg/dL (ref 0.2–1.2)
BUN: 15 mg/dL (ref 7–25)
CO2: 24 mmol/L (ref 20–32)
Calcium: 10.1 mg/dL (ref 8.6–10.3)
Chloride: 103 mmol/L (ref 98–110)
Creat: 1.01 mg/dL (ref 0.60–1.35)
GFR, EST AFRICAN AMERICAN: 104 mL/min/{1.73_m2} (ref >=60)
GFR, EST NON AFRICAN AMERICAN: 89 mL/min/{1.73_m2} (ref >=60)
GLUCOSE: 142 mg/dL — AB (ref 65–99)
Globulin: 3.3 g/dL (calc) (ref 1.9–3.7)
Potassium: 4.7 mmol/L (ref 3.5–5.3)
Sodium: 138 mmol/L (ref 135–146)
TOTAL PROTEIN: 8.1 g/dL (ref 6.1–8.1)

## 2017-11-26 LAB — LIPID PANEL
Cholesterol: 158 mg/dL (ref <200)
HDL: 51 mg/dL (ref >40)
LDL Cholesterol (Calc): 81 mg/dL (calc)
NON-HDL CHOLESTEROL (CALC): 107 mg/dL (ref <130)
TRIGLYCERIDES: 155 mg/dL — AB (ref <150)
Total CHOL/HDL Ratio: 3.1 (calc) (ref <5.0)

## 2017-11-26 LAB — POCT GLYCOSYLATED HEMOGLOBIN (HGB A1C): HEMOGLOBIN A1C: 7.1

## 2017-11-26 LAB — GLUCOSE, POCT (MANUAL RESULT ENTRY): POC Glucose: 149 mg/dl — AB (ref 70–99)

## 2017-11-26 MED ORDER — ESOMEPRAZOLE MAGNESIUM 40 MG PO CPDR: 40.0000 mg | DELAYED_RELEASE_CAPSULE | Freq: Every day | ORAL | 0 refills | Status: DC

## 2017-11-26 MED ORDER — DICLOFENAC SODIUM 75 MG PO TBEC: 75.0000 mg | DELAYED_RELEASE_TABLET | Freq: Two times a day (BID) | ORAL | 0 refills | Status: DC

## 2017-11-26 MED ORDER — METFORMIN HCL 1000 MG PO TABS: ORAL_TABLET | ORAL | 0 refills | Status: DC

## 2017-11-26 MED ORDER — ALPRAZOLAM 0.5 MG PO TABS: 0.5000 mg | ORAL_TABLET | Freq: Every day | ORAL | 0 refills | Status: DC | PRN

## 2017-11-26 MED ORDER — ATORVASTATIN CALCIUM 10 MG PO TABS: 10.0000 mg | ORAL_TABLET | Freq: Every day | ORAL | 0 refills | Status: DC

## 2017-11-26 NOTE — Progress Notes (Signed)
Name: Trevor Dennis.   MRN: [id number]    DOB: [birth year]   Date:11/26/2017       Progress Note  Subjective  Chief Complaint  Chief Complaint  Patient presents with  . Follow-up    Diabetes  He presents for his follow-up diabetic visit. His disease course has been stable. Hypoglycemia symptoms include nervousness/anxiousness. Pertinent negatives for hypoglycemia include no headaches or sweats. Pertinent negatives for diabetes include no fatigue, no foot paresthesias, no polydipsia and no polyuria. Pertinent negatives for diabetic complications include no CVA, heart disease or peripheral neuropathy. Risk factors for coronary artery disease include dyslipidemia, male sex and obesity. Current diabetic treatment includes oral agent (monotherapy). His weight is stable. He is following a diabetic and generally healthy diet. 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 He monitors blood glucose at home 1-2 x per day. His breakfast blood glucose range is generally 130-140 mg/dl. An ACE inhibitor/angiotensin II receptor blocker is not being taken.  Hyperlipidemia  This is a chronic problem. The problem is controlled. Recent lipid tests were reviewed and are normal. Exacerbating diseases include diabetes and obesity. Pertinent negatives include no leg pain, myalgias or shortness of breath. Current antihyperlipidemic treatment includes statins.  Anxiety  Presents for follow-up visit. Symptoms include depressed mood, excessive worry, insomnia and nervous/anxious behavior. Patient reports no shortness of breath. The severity of symptoms is moderate.        Past Medical History:  Diagnosis Date  . Anxiety   . Arthritis   . Diabetes mellitus without complication [facility])     Past Surgical History:  Procedure Laterality Date  . BACK SURGERY  8 years ago    Family History  Problem Relation Age of Onset  . Diabetes Mother   . Liver disease Brother     Social History    Socioeconomic History  . Marital status: Divorced    Spouse name: Not on file  . Number of children: Not on file  . Years of education: Not on file  . Highest education level: Not on file  Social Needs  . Financial resource strain: Not on file  . Food insecurity - worry: Not on file  . Food insecurity - inability: Not on file  . Transportation needs - medical: Not on file  . Transportation needs - non-medical: Not on file  Occupational History  . Not on file  Tobacco Use  . Smoking status: Current Some Day Smoker    Packs/day: 1.00    Types: Cigarettes  . Smokeless tobacco: Never Used  . Tobacco comment: social smoker like 1 some days  Substance and Sexual Activity  . Alcohol use: Yes    Alcohol/week: 0.0 oz    Comment: Occasional   . Drug use: No  . Sexual activity: Yes    Partners: Female  Other Topics Concern  . Not on file  Social History Narrative  . Not on file     Current Outpatient Medications:  .  ALPRAZolam (XANAX) 0.5 MG tablet, Take 1 tablet (0.5 mg total) by mouth 3 (three) times daily as needed., Disp: 90 tablet, Rfl: 0 .  atorvastatin (LIPITOR) 10 MG tablet, Take 1 tablet (10 mg total) daily by mouth. Take 1 tablet ( 10 mg total) daily at bedtime., Disp: 90 tablet, Rfl: 0 .  diclofenac (VOLTAREN) 75 MG EC tablet, TAKE 1 TABLET (75 MG TOTAL) BY MOUTH 2 (TWO) TIMES DAILY., Disp: 60 tablet, Rfl: 0 .  esomeprazole (NEXIUM) 40  MG capsule, TAKE 1 CAPSULE (40 MG TOTAL) BY MOUTH DAILY., Disp: 90 capsule, Rfl: 0 .  metFORMIN (GLUCOPHAGE) 1000 MG tablet, TAKE 1 TABLET (1,000 MG TOTAL) BY MOUTH 2 (TWO) TIMES DAILY WITH A MEAL. (Patient taking differently: TAKE 1 TABLET (1,000 MG TOTAL) BY MOUTH 1 (one) TIMES DAILY WITH A MEAL.), Disp: 180 tablet, Rfl: 0 .  Multiple Vitamin (MULTI VITAMIN DAILY PO), Take by mouth daily. , Disp: , Rfl:  .  nortriptyline (PAMELOR) 10 MG capsule, TAKE 10 MG FOR 3 NIGHTS, THEN 20 MG FOR 3 NIGHTS THEN 30 MG AND CONTINUE, Disp: , Rfl: 2 .   sildenafil (VIAGRA) 50 MG tablet, Take 1 tablet (50 mg total) by mouth daily as needed for erectile dysfunction., Disp: 10 tablet, Rfl: 0 .  valACYclovir (VALTREX) 1000 MG tablet, Take 1 tablet (1,000 mg total) by mouth 2 (two) times daily., Disp: 20 tablet, Rfl: 0 .  VICTOZA 18 MG/3ML SOPN, INJECT 0.6 MG SUBCUTANEOUSLY DAILY FOR 1 WEEK, THEN 1.2 MG FOR 1 WEEK, THEN 1.8 MG DAILY THERAFTER, Disp: 5 pen, Rfl: 2 .  Blood Glucose Monitoring Suppl (ONE TOUCH ULTRA MINI) W/DEVICE KIT, See admin instructions., Disp: , Rfl: 0 .  Lancets 30G MISC, , Disp: , Rfl: 12 .  Naltrexone-Bupropion HCl ER (CONTRAVE) 8-90 MG TB12, Take 2 tablets by mouth 2 (two) times daily. Start 1 tab po qAM x 1wk, then 1 tab po bid x 1 wk, then 2 tabs po qAM and 1 tab po qpm x 1 wk., Disp: 360 tablet, Rfl: 0 .  ONE TOUCH ULTRA TEST test strip, USE TO TEST SUGAR 3 TIMES A DAY, Disp: , Rfl: 12  No Known Allergies   Review of Systems  Constitutional: Negative for fatigue.  Respiratory: Negative for shortness of breath.   Musculoskeletal: Negative for myalgias.  Neurological: Negative for headaches.  Endo/Heme/Allergies: Negative for polydipsia.  Psychiatric/Behavioral: The patient is nervous/anxious and has insomnia.     Objective  Vitals:   11/26/17 0910  BP: 126/82  Pulse: 98  Temp: 97.9 F (36.6 C)  TempSrc: Oral  SpO2: 98%  Weight: 245 lb 8 oz (111.4 kg)    Physical Exam  Constitutional: He is oriented to person, place, and time and well-developed, well-nourished, and in no distress.  HENT:  Head: Normocephalic and atraumatic.  Cardiovascular: Normal rate, regular rhythm and normal heart sounds.  No murmur heard. Pulmonary/Chest: Effort normal and breath sounds normal. He has no wheezes.  Abdominal: Soft. Bowel sounds are normal. There is no tenderness.  Musculoskeletal: He exhibits no edema.  Neurological: He is alert and oriented to person, place, and time.  Psychiatric: Mood, memory, affect and judgment  normal.  Nursing note and vitals reviewed.      Recent Results (from the past 2160 hour(s))  POCT glucose (manual entry)     Status: Abnormal   Collection Time: 11/26/17  9:19 AM  Result Value Ref Range   POC Glucose 149 (A) 70 - 99 mg/dl  POCT glycosylated hemoglobin (Hb A1C)     Status: Abnormal   Collection Time: 11/26/17  9:20 AM  Result Value Ref Range   Hemoglobin A1C 7.1      Assessment & Plan  1. Gastroesophageal reflux disease, esophagitis presence not specified Symptoms of reflux stable on PPI - esomeprazole (NEXIUM) 40 MG capsule; Take 1 capsule (40 mg total) by mouth daily.  Dispense: 90 capsule; Refill: 0  2. Degenerative arthritis of cervical spine  - diclofenac (VOLTAREN) 75  MG EC tablet; Take 1 tablet (75 mg total) by mouth 2 (two) times daily.  Dispense: 60 tablet; Refill: 0  3. Pure hypercholesterolemia Now on statin, obtained FLP and liver enzymes - atorvastatin (LIPITOR) 10 MG tablet; Take 1 tablet (10 mg total) by mouth daily. Take 1 tablet ( 10 mg total) daily at bedtime.  Dispense: 90 tablet; Refill: 0 - Lipid panel - COMPLETE METABOLIC PANEL WITH GFR  4. Anxiety   symptoms of anxiety are stable, he takes alprazolam as needed, changed to 1 tablet daily as needed. Refills provided- ALPRAZolam (XANAX) 0.5 MG tablet; Take 1 tablet (0.5 mg total) by mouth daily as needed.  Dispense: 30 tablet; Refill: 0  5. Controlled type 2 diabetes mellitus without complication, without long-term current use of insulin (HCC) Point A1c 7.1%, well-controlled diabetes - POCT glycosylated hemoglobin (Hb A1C) - POCT glucose (manual entry) - metFORMIN (GLUCOPHAGE) 1000 MG tablet; TAKE 1 TABLET (1,000 MG TOTAL) BY MOUTH 2 (TWO) TIMES DAILY WITH A MEAL.  Dispense: 180 tablet; Refill: 0   Vielka Klinedinst [facility] 11/26/2017 9:30 AM

## 2017-12-29 ENCOUNTER — Other Ambulatory Visit: Payer: Self-pay

## 2017-12-29 DIAGNOSIS — M47812 Spondylosis without myelopathy or radiculopathy, cervical region: Secondary | ICD-10-CM

## 2017-12-29 MED ORDER — DICLOFENAC SODIUM 75 MG PO TBEC: 75.0000 mg | DELAYED_RELEASE_TABLET | Freq: Two times a day (BID) | ORAL | 0 refills | Status: DC

## 2017-12-29 NOTE — Telephone Encounter (Signed)
Refill request for general medication: Diclofenac 75 mg  Last office visit: 11/26/2017  Last physical exam: 06/09/2017  Follow-up on file. 03/04/2018

## 2017-12-30 ENCOUNTER — Other Ambulatory Visit: Payer: Self-pay

## 2018-01-30 ENCOUNTER — Other Ambulatory Visit: Payer: Self-pay | Admitting: Family Medicine

## 2018-01-30 DIAGNOSIS — M47812 Spondylosis without myelopathy or radiculopathy, cervical region: Secondary | ICD-10-CM

## 2018-03-04 ENCOUNTER — Ambulatory Visit (INDEPENDENT_AMBULATORY_CARE_PROVIDER_SITE_OTHER): Payer: BLUE CROSS/BLUE SHIELD | Admitting: Family Medicine

## 2018-03-04 ENCOUNTER — Encounter: Payer: Self-pay | Admitting: Family Medicine

## 2018-03-04 VITALS — BP 112/90 | HR 68 | Resp 16 | Ht 68.0 in | Wt 248.4 lb

## 2018-03-04 DIAGNOSIS — E1169 Type 2 diabetes mellitus with other specified complication: Secondary | ICD-10-CM | POA: Diagnosis not present

## 2018-03-04 DIAGNOSIS — E118 Type 2 diabetes mellitus with unspecified complications: Secondary | ICD-10-CM | POA: Diagnosis not present

## 2018-03-04 DIAGNOSIS — E7800 Pure hypercholesterolemia, unspecified: Secondary | ICD-10-CM

## 2018-03-04 DIAGNOSIS — B001 Herpesviral vesicular dermatitis: Secondary | ICD-10-CM

## 2018-03-04 DIAGNOSIS — Z23 Encounter for immunization: Secondary | ICD-10-CM

## 2018-03-04 DIAGNOSIS — K219 Gastro-esophageal reflux disease without esophagitis: Secondary | ICD-10-CM | POA: Diagnosis not present

## 2018-03-04 DIAGNOSIS — E785 Hyperlipidemia, unspecified: Secondary | ICD-10-CM | POA: Diagnosis not present

## 2018-03-04 DIAGNOSIS — Z9889 Other specified postprocedural states: Secondary | ICD-10-CM | POA: Diagnosis not present

## 2018-03-04 LAB — POCT UA - MICROALBUMIN: MICROALBUMIN (UR) POC: NEGATIVE mg/L

## 2018-03-04 LAB — POCT GLYCOSYLATED HEMOGLOBIN (HGB A1C): HEMOGLOBIN A1C: 7.9 % — AB (ref 4.0–5.6)

## 2018-03-04 MED ORDER — SEMAGLUTIDE (1 MG/DOSE) 2 MG/1.5ML ~~LOC~~ SOPN: 1.0000 mg | PEN_INJECTOR | SUBCUTANEOUS | 2 refills | Status: DC

## 2018-03-04 MED ORDER — ESOMEPRAZOLE MAGNESIUM 40 MG PO CPDR: 40.0000 mg | DELAYED_RELEASE_CAPSULE | Freq: Every day | ORAL | 1 refills | Status: DC

## 2018-03-04 MED ORDER — ATORVASTATIN CALCIUM 10 MG PO TABS: 10.0000 mg | ORAL_TABLET | Freq: Every day | ORAL | 1 refills | Status: DC

## 2018-03-04 MED ORDER — METFORMIN HCL ER 750 MG PO TB24: 750.0000 mg | ORAL_TABLET | Freq: Every day | ORAL | 0 refills | Status: DC

## 2018-03-04 MED ORDER — VALACYCLOVIR HCL 1 G PO TABS: 1000.0000 mg | ORAL_TABLET | Freq: Two times a day (BID) | ORAL | 0 refills | Status: DC

## 2018-03-04 NOTE — Progress Notes (Signed)
Name: Trevor Dennis.   MRN: [id number]    DOB: 07-Jan-1971   Date:03/04/2018       Progress Note  Subjective  Chief Complaint  Chief Complaint  Patient presents with  . Diabetes  . Hyperlipidemia  . Anxiety  . Hypothyroidism    HPI  Diabetes: he used to take Victoza and metformin, but has a higher deductible and stopped January 2019. He is now on metfomin only and hgbA1C has gone up from 7.1% - 7.9% He would like to try going on back on a GLP-1 agonist. He denies family history of thyroid cancer, or personal history of pancreatitis. He has complications from DM, such as dyslipidemia, fatty liver disease and is on statin therapy , discussed diet. He has polyphagia, but denies polydipsia and polyuria.   Morbid obesity: he lost weight on Victoza, states he is hungry all the time without medication. He is willing to try GLP-1 agonist. He has a high BMI with co-morbidities - high lipids and diabetes  Fatty liver: he states he had a liver biopsy in the past and seen by GI we will continue to monitor for now.   Dyslipidemia: calculated his risk and aspirin is not advised.   GERD: he takes PPI daily, discussed long term use and risk associated with it.   Patient Active Problem List   Diagnosis Date Noted  . History of lumbar discectomy 03/04/2018  . Adult hypothyroidism 11/26/2017  . Fatigue 11/26/2017  . Hypotestosteronism 11/26/2017  . History of subarachnoid hemorrhage 09/17/2017  . Vitamin D insufficiency 08/26/2017  . GERD (gastroesophageal reflux disease) 10/25/2016  . Morbid obesity ([place]) 10/25/2016  . 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 Anxiety 01/01/2016  . Elevated liver enzymes 07/27/2015  . Skin lesion of back 07/27/2015  . Intermittent low back pain 07/27/2015  . Fatty liver 05/03/2015  . Dyslipidemia associated with type 2 diabetes mellitus ([place]) 04/24/2015  . HLD (hyperlipidemia) 04/24/2015  . Fever blister 04/24/2015    Past Surgical  History:  Procedure Laterality Date  . BACK SURGERY  8 years ago    Family History  Problem Relation Age of Onset  . Diabetes Mother   . Liver disease Brother     Social History   Socioeconomic History  . Marital status: Divorced    Spouse name: Not on file  . Number of children: 2  . Years of education: Not on file  . Highest education level: Not on file  Occupational History  . Not on file  Social Needs  . Financial resource strain: Not on file  . Food insecurity:    Worry: Not on file    Inability: Not on file  . Transportation needs:    Medical: Not on file    Non-medical: Not on file  Tobacco Use  . Smoking status: Former Smoker    Packs/day: 1.00    Years: 30.00    Pack years: 30.00    Types: Cigarettes    Last attempt to quit: 06/04/2016    Years since quitting: 1.7  . Smokeless tobacco: Never Used  . Tobacco comment: social smoker like 1 some days  Substance and Sexual Activity  . Alcohol use: Yes    Alcohol/week: 0.0 oz    Comment: Occasional   . Drug use: No  . Sexual activity: Yes    Partners: Female  Lifestyle  . Physical activity:    Days per week: Not on file    Minutes per session:  Not on file  . Stress: Not on file  Relationships  . Social connections:    Talks on phone: Not on file    Gets together: Not on file    Attends religious service: Not on file    Active member of club or organization: Not on file    Attends meetings of clubs or organizations: Not on file    Relationship status: Not on file  . Intimate partner violence:    Fear of current or ex partner: Not on file    Emotionally abused: Not on file    Physically abused: Not on file    Forced sexual activity: Not on file  Other Topics Concern  . Not on file  Social History Narrative   Divorced, ex wife used to cheat on him and also was an alcoholic    He has custody of his children     Current Outpatient Medications:  .  ALPRAZolam (XANAX) 0.5 MG tablet, Take 1 tablet  (0.5 mg total) by mouth daily as needed., Disp: 30 tablet, Rfl: 0 .  atorvastatin (LIPITOR) 10 MG tablet, Take 1 tablet (10 mg total) by mouth daily. Take 1 tablet ( 10 mg total) daily at bedtime., Disp: 90 tablet, Rfl: 0 .  Blood Glucose Monitoring Suppl (ONE TOUCH ULTRA MINI) W/DEVICE KIT, See admin instructions., Disp: , Rfl: 0 .  esomeprazole (NEXIUM) 40 MG capsule, Take 1 capsule (40 mg total) by mouth daily., Disp: 90 capsule, Rfl: 0 .  Lancets 30G MISC, , Disp: , Rfl: 12 .  Multiple Vitamin (MULTI VITAMIN DAILY PO), Take by mouth daily. , Disp: , Rfl:  .  ONE TOUCH ULTRA TEST test strip, USE TO TEST SUGAR 3 TIMES A DAY, Disp: , Rfl: 12 .  valACYclovir (VALTREX) 1000 MG tablet, Take 1 tablet (1,000 mg total) by mouth 2 (two) times daily., Disp: 20 tablet, Rfl: 0 .  nortriptyline (PAMELOR) 10 MG capsule, TAKE 10 MG FOR 3 NIGHTS, THEN 20 MG FOR 3 NIGHTS THEN 30 MG AND CONTINUE, Disp: , Rfl: 2  No Known Allergies   ROS  Constitutional: Negative for fever or weight change.  Respiratory: Negative for cough and shortness of breath.   Cardiovascular: Negative for chest pain or palpitations.  Gastrointestinal: Negative for abdominal pain, no bowel changes.  Musculoskeletal: Negative for gait problem or joint swelling.  Skin: Negative for rash.  Neurological: Negative for dizziness or headache.  No other specific complaints in a complete review of systems (except as listed in HPI above).  Objective  Vitals:   03/04/18 0806  BP: 112/90  Pulse: 68  Resp: 16  SpO2: 98%  Weight: 248 lb 6.4 oz (112.7 kg)  Height: _0  (1.727 m)    Body mass index is 37.77 kg/m.  Physical Exam  Constitutional: Patient appears well-developed and well-nourished. Obese  No distress.  HEENT: head atraumatic, normocephalic, pupils equal and reactive to light,neck supple, throat within normal limits Cardiovascular: Normal rate, regular rhythm and normal heart sounds.  No murmur heard. No BLE  edema. Pulmonary/Chest: Effort normal and breath sounds normal. No respiratory distress. Abdominal: Soft.  There is no tenderness. Psychiatric: Patient has a normal mood and affect. behavior is normal. Judgment and thought content normal.  Recent Results (from the past 2160 hour(s))  POCT HgB A1C     Status: Abnormal   Collection Time: 03/04/18  8:23 AM  Result Value Ref Range   Hemoglobin A1C 7.9 (A) 4.0 - 5.6 %  HbA1c, POC (prediabetic range)  5.7 - 6.4 %   HbA1c, POC (controlled diabetic range)  0.0 - 7.0 %     PHQ2/9: Depression screen [facility] 2/9 11/26/2017 08/26/2017 06/09/2017 05/27/2017 01/23/2017  Decreased Interest 0 0 0 0 0  Down, Depressed, Hopeless 0 0 0 0 0  PHQ - 2 Score 0 0 0 0 0    Fall Risk: Fall Risk  03/04/2018 11/26/2017 08/26/2017 06/09/2017 05/27/2017  Falls in the past year? _0     Functional Status Survey: Is the patient deaf or have difficulty hearing?: No Does the patient have difficulty seeing, even when wearing glasses/contacts?: No Does the patient have difficulty concentrating, remembering, or making decisions?: No Does the patient have difficulty walking or climbing stairs?: No Does the patient have difficulty dressing or bathing?: No Does the patient have difficulty doing errands alone such as visiting a doctor's office or shopping?: No   Assessment & Plan  1. Controlled type 2 diabetes mellitus without complication, without long-term current use of insulin (HCC)  - POCT HgB A1C - POCT UA - Microalbumin  2. History of lumbar discectomy   3. Dyslipidemia associated with type 2 diabetes mellitus (HCC)  - metFORMIN (GLUCOPHAGE-XR) 750 MG 24 hr tablet; Take 1 tablet (750 mg total) by mouth daily with breakfast.  Dispense: 180 tablet; Refill: 0 - Semaglutide (OZEMPIC) 1 MG/DOSE SOPN; Inject 1 mg into the skin once a week.  Dispense: 4 pen; Refill: 2  4. Fever blister  - valACYclovir (VALTREX) 1000 MG tablet; Take 1 tablet (1,000 mg  total) by mouth 2 (two) times daily.  Dispense: 20 tablet; Refill: 0  5. Need for Tdap vaccination  - Tdap vaccine greater than or equal to 7yo IM  6. Need for vaccination for pneumococcus  - Pneumococcal polysaccharide vaccine 23-valent greater than or equal to 2yo subcutaneous/IM  7. Pure hypercholesterolemia  - atorvastatin (LIPITOR) 10 MG tablet; Take 1 tablet (10 mg total) by mouth daily. Take 1 tablet ( 10 mg total) daily at bedtime.  Dispense: 90 tablet; Refill: 1  8. Morbid obesity ([place])  Discussed with the patient the risk posed by an increased BMI. Discussed importance of portion control, calorie counting and at least 150 minutes of physical activity weekly. Avoid sweet beverages and drink more water. Eat at least 6 servings of fruit and vegetables daily    9. Gastroesophageal reflux disease without esophagitis  - esomeprazole (NEXIUM) 40 MG capsule; Take 1 capsule (40 mg total) by mouth daily.  Dispense: 90 capsule; Refill: 1

## 2018-03-11 ENCOUNTER — Other Ambulatory Visit: Payer: Self-pay | Admitting: Family Medicine

## 2018-03-11 DIAGNOSIS — M47812 Spondylosis without myelopathy or radiculopathy, cervical region: Secondary | ICD-10-CM

## 2018-03-11 NOTE — Telephone Encounter (Signed)
Refill request for general medication. diclofenac (VOLTAREN) 75 MG EC tablet to [facility]  Last office visit: 03/04/2018  Follow up on 06/05/2018

## 2018-03-15 ENCOUNTER — Other Ambulatory Visit: Payer: Self-pay | Admitting: Family Medicine

## 2018-03-15 DIAGNOSIS — M47812 Spondylosis without myelopathy or radiculopathy, cervical region: Secondary | ICD-10-CM

## 2018-03-15 NOTE — Telephone Encounter (Signed)
It states it was discontinued because of cost. Can you please verify?

## 2018-03-16 ENCOUNTER — Telehealth: Payer: Self-pay | Admitting: Family Medicine

## 2018-03-16 NOTE — Telephone Encounter (Signed)
Is he taking it? It says that he stopped medication because of cost. Please verify.

## 2018-03-16 NOTE — Telephone Encounter (Signed)
Refill request for general medication: Voltaren Gel  Last office visit: 03/04/2018   Last physical exam: 06/11/2018   Follow-ups on file. 06/05/2018

## 2018-03-16 NOTE — Telephone Encounter (Signed)
Per the patient he made a mistake and that he is still taking the Voltaren Gel. He said that the one medication that is so expensive is the Victoza but she now has him taking Ozempic. So please fill the Voltaren.

## 2018-03-17 NOTE — Telephone Encounter (Signed)
Patient does still take Voltaren. He states that he was confused but it was the Victoza that was to expense and not the Voltaren. Please send rx for Voltaren.

## 2018-03-31 ENCOUNTER — Other Ambulatory Visit: Payer: Self-pay | Admitting: Family Medicine

## 2018-03-31 DIAGNOSIS — E1169 Type 2 diabetes mellitus with other specified complication: Secondary | ICD-10-CM

## 2018-03-31 DIAGNOSIS — E785 Hyperlipidemia, unspecified: Principal | ICD-10-CM

## 2018-03-31 MED ORDER — METFORMIN HCL ER 750 MG PO TB24: 750.0000 mg | ORAL_TABLET | Freq: Every day | ORAL | 0 refills | Status: DC

## 2018-03-31 NOTE — Telephone Encounter (Signed)
Copied from CRM [phone]#[id number]. Topic: Quick Communication - Rx Refill/Question >> Mar 31, 2018 10:26 AM Crist InfanteHarrald, Kathy J wrote: Medication: metFORMIN (GLUCOPHAGE-XR) 750 MG 24 hr tablet  Pt states he was advised to take 2 in the am if he needed, and pt state sthat is what he has been doing. Now pt is out and need refill. 90 day ok 180 tabs [facility] [id number] [place], [place] - [address] [phone] (Phone) [phone] (Fax)

## 2018-04-21 ENCOUNTER — Other Ambulatory Visit: Payer: Self-pay | Admitting: Family Medicine

## 2018-04-21 DIAGNOSIS — E1169 Type 2 diabetes mellitus with other specified complication: Secondary | ICD-10-CM

## 2018-04-21 DIAGNOSIS — E785 Hyperlipidemia, unspecified: Principal | ICD-10-CM

## 2018-04-21 NOTE — Telephone Encounter (Signed)
Copied from CRM [id number]#[id number]. Topic: Quick Communication - Rx Refill/Question >> Apr 21, 2018  4:57 PM Laural BenesJohnson, Louisianahiquita C wrote: Medication: metFORMIN (GLUCOPHAGE-XR) 750 MG 24 hr tablet --- 90 day supply  Has the patient contacted their pharmacy? Yes  (Agent: If no, request that the patient contact the pharmacy for the refill.) (Agent: If yes, when and what did the pharmacy advise?)  Preferred Pharmacy (with phone number or street name): [id number] [place], [place] - [address]  Agent: Please be advised that RX refills may take up to 3 business days. We ask that you follow-up with your pharmacy.

## 2018-04-22 MED ORDER — METFORMIN HCL ER 750 MG PO TB24: 750.0000 mg | ORAL_TABLET | Freq: Every day | ORAL | 0 refills | Status: DC

## 2018-04-22 NOTE — Telephone Encounter (Signed)
Refill request was sent to Dr. Krichna Sowles for approval and submission.  

## 2018-04-27 ENCOUNTER — Telehealth: Payer: Self-pay | Admitting: Family Medicine

## 2018-04-27 DIAGNOSIS — E785 Hyperlipidemia, unspecified: Principal | ICD-10-CM

## 2018-04-27 DIAGNOSIS — E1169 Type 2 diabetes mellitus with other specified complication: Secondary | ICD-10-CM

## 2018-04-27 NOTE — Telephone Encounter (Signed)
Copied from CRM [phone]. Topic: Quick Communication - Rx Refill/Question >> Apr 27, 2018  4:45 PM Alexander BergeronBarksdale, Trevor B wrote: Medication: metFORMIN (GLUCOPHAGE-XR) 750 MG 24 hr tablet [[id number]][[id number]]   Pt called to get a new script of the medication above; pt states that the script is suppose to be sent in for 2 tablets a day instead of 1 so that the insurance will cover the medication; contact pt to advise

## 2018-04-28 MED ORDER — METFORMIN HCL ER 750 MG PO TB24: 1500.0000 mg | ORAL_TABLET | Freq: Every day | ORAL | 0 refills | Status: DC

## 2018-05-05 IMAGING — US US ABDOMEN LIMITED
1 series · 14 of 25 positions shown · non-contrast
Comparison: Ultrasound the abdomen of 03/02/2013

CLINICAL DATA: Elevated liver function tests, history of fatty
liver disease

EXAM:
ULTRASOUND ABDOMEN LIMITED RIGHT UPPER QUADRANT

[Series 1: us abdomen limited · 0.25mm/px · 14 of 49 slices shown]
[im 1/49]
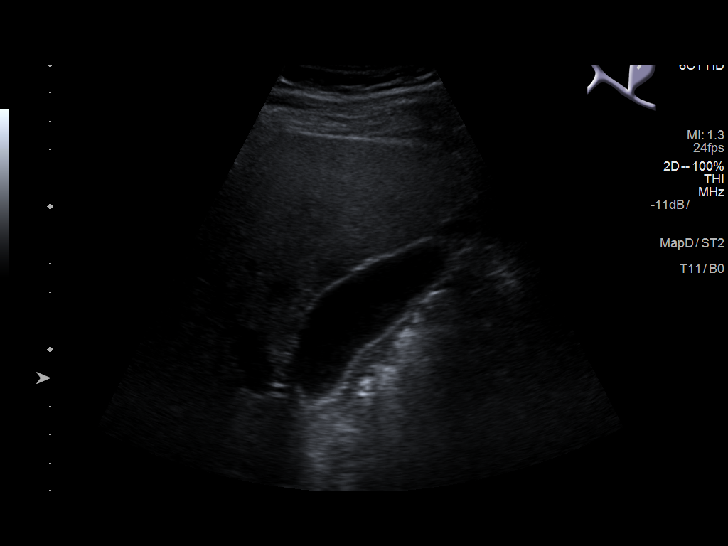
[im 5/49]
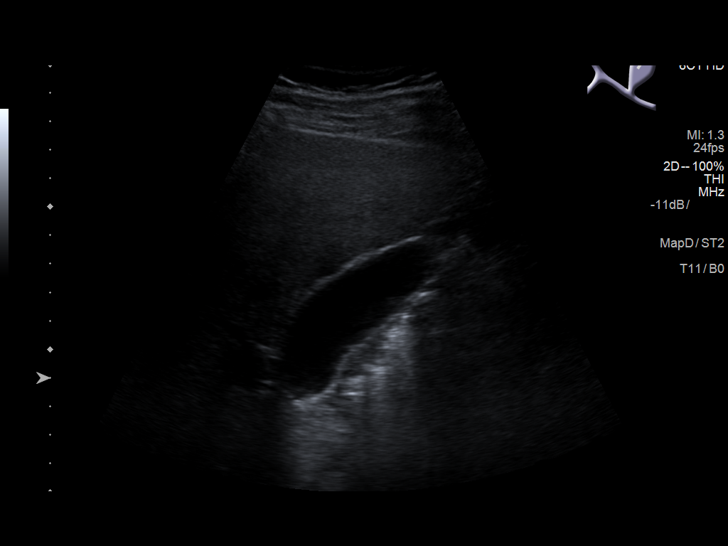
[im 9/49]
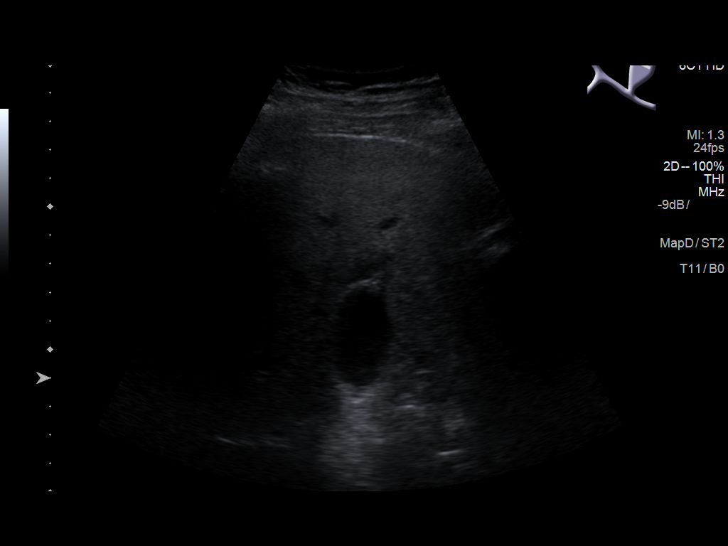
[im 13/49]
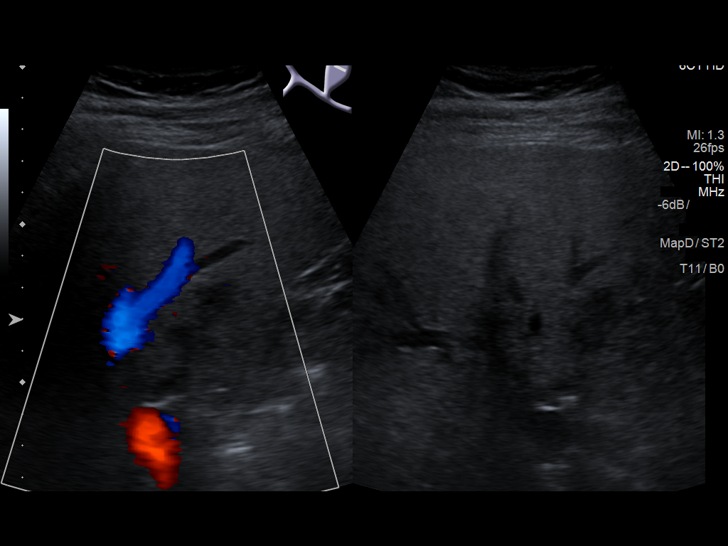
[im 17/49]
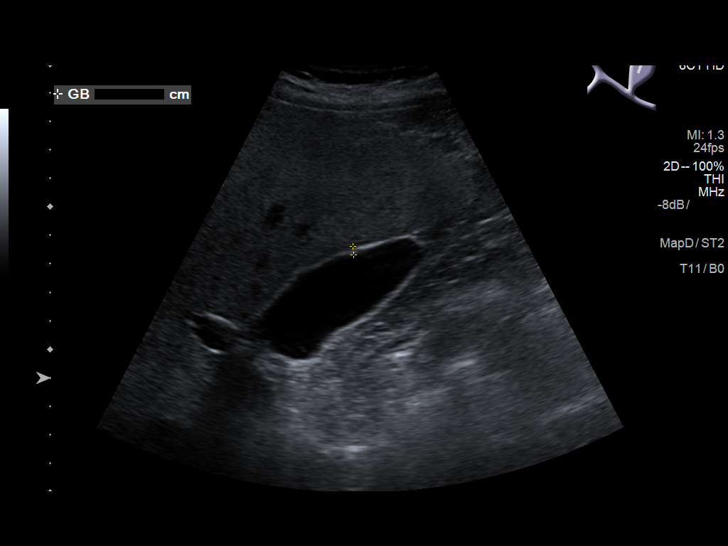
[im 19/49]
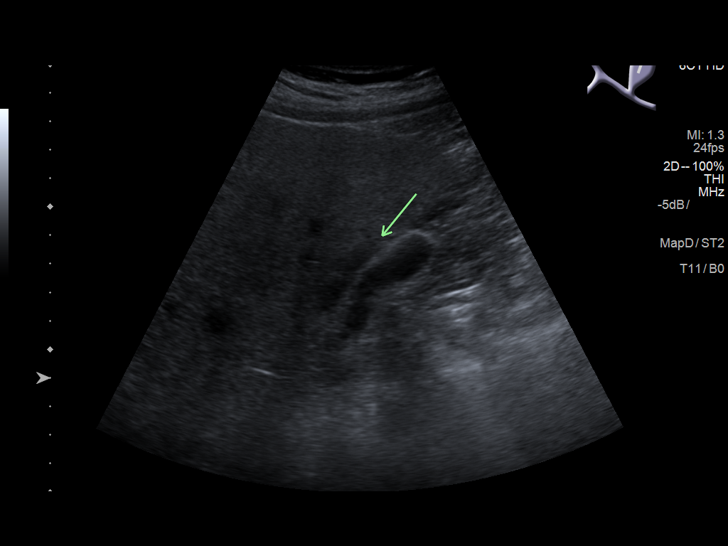
[im 23/49]
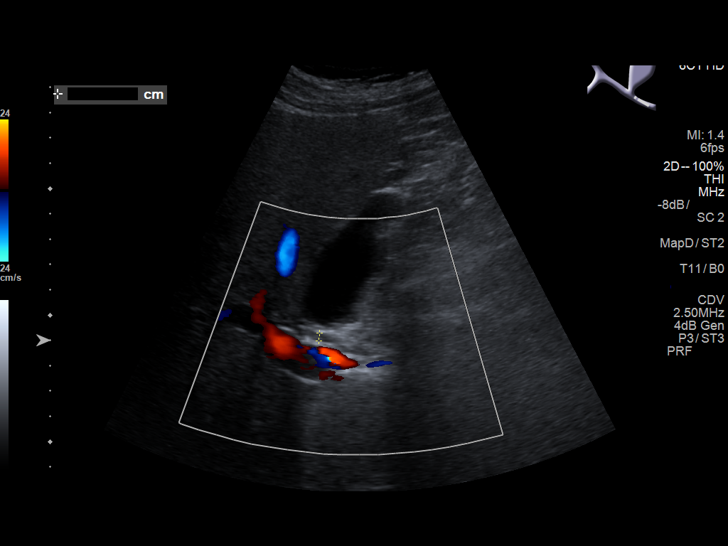
[im 27/49]
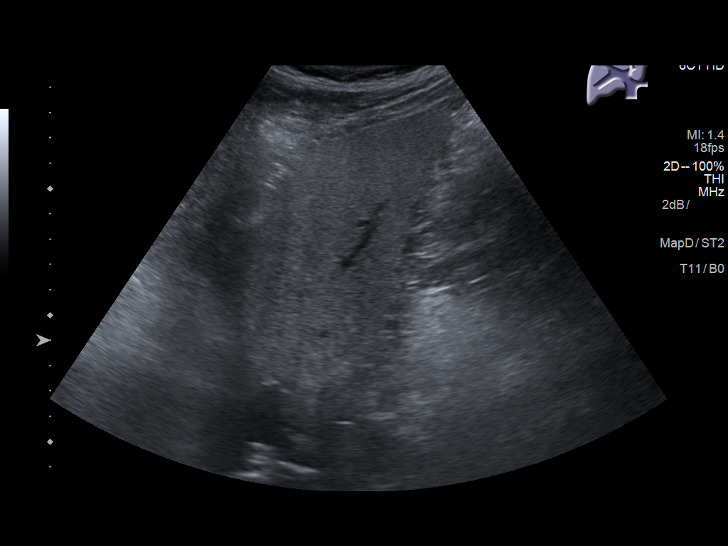
[im 31/49]
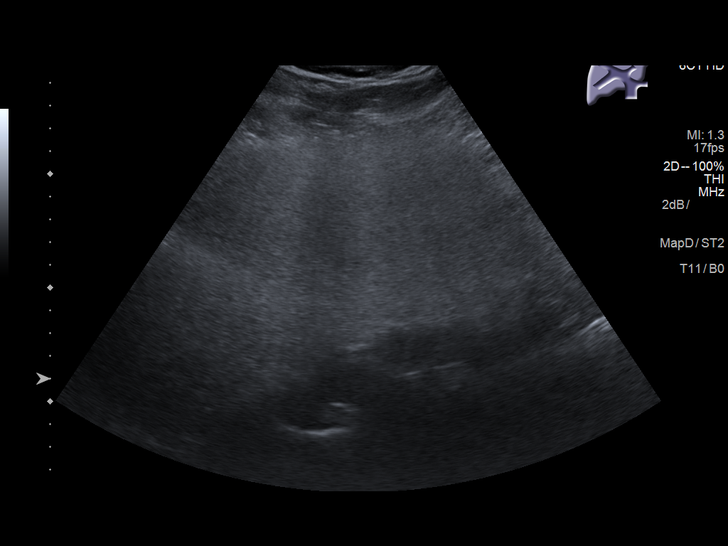
[im 33/49]
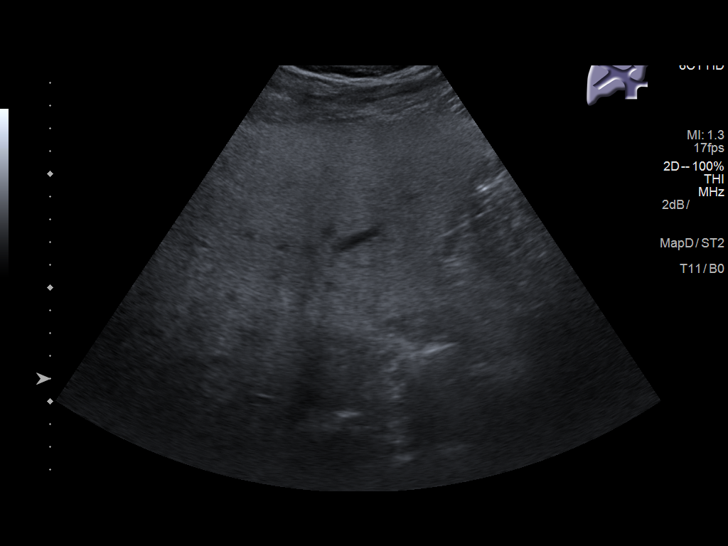
[im 37/49]
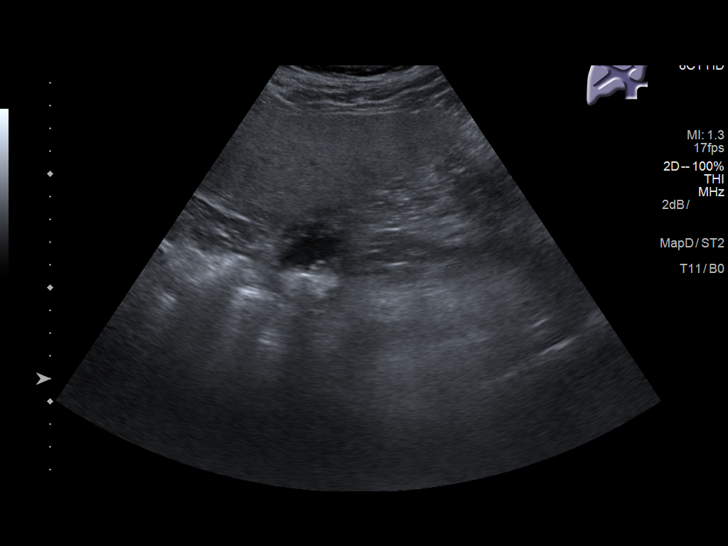
[im 41/49]
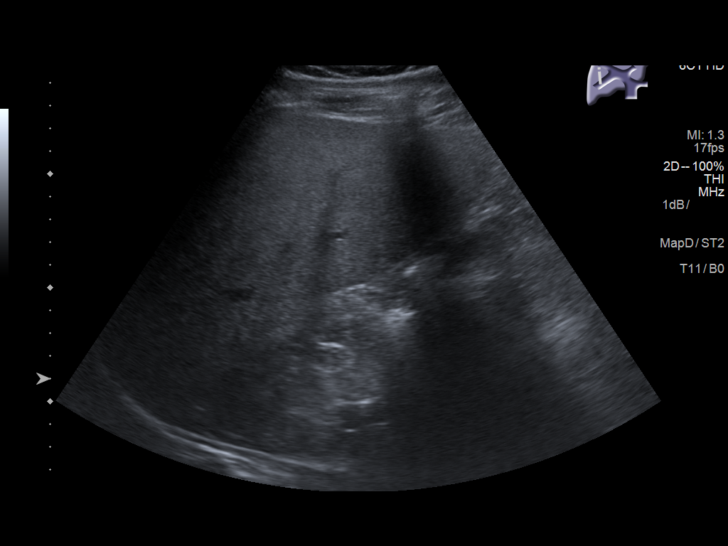
[im 45/49]
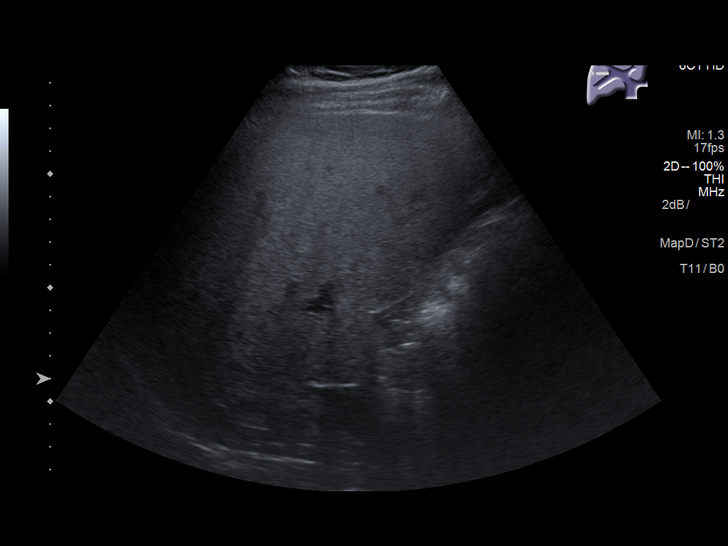
[im 49/49]
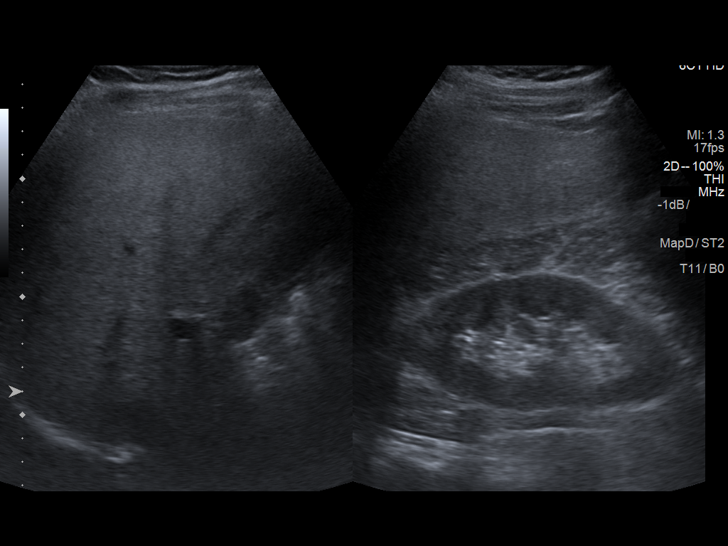

[14 of 25 positions shown; findings below may reference images not displayed]

FINDINGS: Gallbladder:

The gallbladder is well visualized. No gallstones are seen. There is
no pain over the gallbladder with compression.

Common bile duct:

Diameter: The common bile duct measures 3 mm in caliber.

Liver:

The liver is echogenic and inhomogeneous consistent with fatty
infiltration with some fatty sparing near the gallbladder. The
portal vein is patent with blood fluid in the normal direction.
IMPRESSION: 1. No gallstones.
2. Fatty infiltration of the liver with some focal sparing near the
gallbladder.

## 2018-05-24 ENCOUNTER — Other Ambulatory Visit: Payer: Self-pay | Admitting: Family Medicine

## 2018-05-24 DIAGNOSIS — M47812 Spondylosis without myelopathy or radiculopathy, cervical region: Secondary | ICD-10-CM

## 2018-06-05 ENCOUNTER — Encounter: Payer: Self-pay | Admitting: Family Medicine

## 2018-06-05 ENCOUNTER — Ambulatory Visit: Payer: BLUE CROSS/BLUE SHIELD | Admitting: Family Medicine

## 2018-06-05 VITALS — BP 122/78 | HR 74 | Temp 97.7°F | Resp 16 | Ht 68.0 in | Wt 246.3 lb

## 2018-06-05 DIAGNOSIS — E1169 Type 2 diabetes mellitus with other specified complication: Secondary | ICD-10-CM

## 2018-06-05 DIAGNOSIS — Z9889 Other specified postprocedural states: Secondary | ICD-10-CM

## 2018-06-05 DIAGNOSIS — E785 Hyperlipidemia, unspecified: Secondary | ICD-10-CM

## 2018-06-05 DIAGNOSIS — E559 Vitamin D deficiency, unspecified: Secondary | ICD-10-CM

## 2018-06-05 DIAGNOSIS — R0683 Snoring: Secondary | ICD-10-CM

## 2018-06-05 DIAGNOSIS — E118 Type 2 diabetes mellitus with unspecified complications: Secondary | ICD-10-CM | POA: Diagnosis not present

## 2018-06-05 DIAGNOSIS — M47892 Other spondylosis, cervical region: Secondary | ICD-10-CM

## 2018-06-05 DIAGNOSIS — E7800 Pure hypercholesterolemia, unspecified: Secondary | ICD-10-CM | POA: Diagnosis not present

## 2018-06-05 DIAGNOSIS — K219 Gastro-esophageal reflux disease without esophagitis: Secondary | ICD-10-CM

## 2018-06-05 DIAGNOSIS — Z23 Encounter for immunization: Secondary | ICD-10-CM

## 2018-06-05 DIAGNOSIS — G4709 Other insomnia: Secondary | ICD-10-CM

## 2018-06-05 LAB — POCT GLYCOSYLATED HEMOGLOBIN (HGB A1C): HbA1c, POC (controlled diabetic range): 7.3 % — AB (ref 0.0–7.0)

## 2018-06-05 MED ORDER — MELOXICAM 15 MG PO TABS: 15.0000 mg | ORAL_TABLET | Freq: Every day | ORAL | 0 refills | Status: DC | PRN

## 2018-06-05 MED ORDER — ESOMEPRAZOLE MAGNESIUM 40 MG PO CPDR: 40.0000 mg | DELAYED_RELEASE_CAPSULE | Freq: Every day | ORAL | 1 refills | Status: DC

## 2018-06-05 MED ORDER — ATORVASTATIN CALCIUM 10 MG PO TABS: 10.0000 mg | ORAL_TABLET | Freq: Every day | ORAL | 1 refills | Status: DC

## 2018-06-05 MED ORDER — METFORMIN HCL ER 750 MG PO TB24: 1500.0000 mg | ORAL_TABLET | Freq: Every day | ORAL | 0 refills | Status: DC

## 2018-06-05 NOTE — Progress Notes (Signed)
Name: Trevor Dennis.   MRN: [id number]    DOB: Mar 08, 1971   Date:06/05/2018       Progress Note  Subjective  Chief Complaint  Chief Complaint  Patient presents with  . Medication Refill  . Diabetes    Checks BS whenever he feels bad, Average-96  . Hyperlipidemia  . Anxiety  . Hypothyroidism  . Gastroesophageal Reflux  . Snoring    Son states his dad will wake him up from snoring    HPI  Diabetes: he used to take Victoza and metformin, but has a higher deductible and stopped January 2019. Last visit his hgbA1C had gone from 7.1% - 7.9% He would like to try going on back on a GLP-1 agonist we gave him Ozempic , however he forgot to take it. Currently only on 1500 mg daily He is trying to eat healthier, drinking more water, eating salads for lunch, snacking healthier and hgbA1C is down to 7.3%. He wants to continue life style modification instead of adding another medication at this time. He has dyslipdiemia and is on atorvastatin   Morbid obesity: he lost weight on Victoza, states he is hungry all the time without medication. Currently off GLP 1 but still lost 2 lbs since last visit, trying to do better with life style changes.   Fatty liver: he states he had a liver biopsy in the past and seen by GI we will continue to monitor for now. Unchanged   Dyslipidemia: calculated his risk and aspirin is not advised. He is on statin therapy   GERD: he takes PPI daily, discussed long term use and risk associated with it. Stable, also explained that taking nsaid's daily is not healthy and can make reflux worse and also affect his kidney and bp  Snoring: his 56 yo son is complaining of his loud snoring, he also states wakes up 3-4 times per night and feels tired when he wakes up in the morning. Never feels refreshed, his EES score today was 10. We will refer him for sleep study   Joint pains/back pain/neck pain: we will stop diclofenac, he states symptoms are worse during the winter and  when it rains, he has been taking diclofenac daily, advised to try tylenol bid and meloxicam very seldom if pain is severe.   Patient Active Problem List   Diagnosis Date Noted  . History of lumbar discectomy 03/04/2018  . Fatigue 11/26/2017  . Hypotestosteronism 11/26/2017  . History of subarachnoid hemorrhage 09/17/2017  . Vitamin D insufficiency 08/26/2017  . GERD (gastroesophageal reflux disease) 10/25/2016  . Morbid obesity ([place]) 10/25/2016  . 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 Anxiety 01/01/2016  . Elevated liver enzymes 07/27/2015  . Skin lesion of back 07/27/2015  . Intermittent low back pain 07/27/2015  . Fatty liver 05/03/2015  . Dyslipidemia associated with type 2 diabetes mellitus ([place]) 04/24/2015  . HLD (hyperlipidemia) 04/24/2015  . Fever blister 04/24/2015    Past Surgical History:  Procedure Laterality Date  . BACK SURGERY  8 years ago    Family History  Problem Relation Age of Onset  . Diabetes Mother   . Liver disease Brother   . Alcohol abuse Brother   . ADD / ADHD Son   . ADD / ADHD Son     Social History   Socioeconomic History  . Marital status: Divorced    Spouse name: Not on file  . Number of children: 2  . Years of education: Not on  file  . Highest education level: Associate degree: occupational, Hotel manager, or vocational program  Occupational History  . Not on file  Social Needs  . Financial resource strain: Not hard at all  . Food insecurity:    Worry: Never true    Inability: Never true  . Transportation needs:    Medical: No    Non-medical: No  Tobacco Use  . Smoking status: Former Smoker    Packs/day: 1.00    Years: 30.00    Pack years: 30.00    Types: Cigarettes    Last attempt to quit: 06/04/2016    Years since quitting: 2.0  . Smokeless tobacco: Never Used  . Tobacco comment: social smoker like 1 some days  Substance and Sexual Activity  . Alcohol use: Yes    Alcohol/week: 0.0 standard drinks     Comment: Occasional   . Drug use: No  . Sexual activity: Not Currently    Partners: Female  Lifestyle  . Physical activity:    Days per week: Not on file    Minutes per session: Not on file  . Stress: Not on file  Relationships  . Social connections:    Talks on phone: Not on file    Gets together: Not on file    Attends religious service: Not on file    Active member of club or organization: Not on file    Attends meetings of clubs or organizations: Not on file    Relationship status: Divorced  . Intimate partner violence:    Fear of current or ex partner: No    Emotionally abused: No    Physically abused: No    Forced sexual activity: No  Other Topics Concern  . Not on file  Social History Narrative   Divorced, ex wife used to cheat on him and also was an alcoholic    He has custody of his children     Current Outpatient Medications:  .  ALPRAZolam (XANAX) 0.5 MG tablet, Take 1 tablet (0.5 mg total) by mouth daily as needed., Disp: 30 tablet, Rfl: 0 .  atorvastatin (LIPITOR) 10 MG tablet, Take 1 tablet (10 mg total) by mouth daily. Take 1 tablet ( 10 mg total) daily at bedtime., Disp: 90 tablet, Rfl: 1 .  Blood Glucose Monitoring Suppl (ONE TOUCH ULTRA MINI) W/DEVICE KIT, See admin instructions., Disp: , Rfl: 0 .  esomeprazole (NEXIUM) 40 MG capsule, Take 1 capsule (40 mg total) by mouth daily., Disp: 90 capsule, Rfl: 1 .  metFORMIN (GLUCOPHAGE-XR) 750 MG 24 hr tablet, Take 2 tablets (1,500 mg total) by mouth daily with breakfast., Disp: 180 tablet, Rfl: 0 .  Multiple Vitamin (MULTI VITAMIN DAILY PO), Take by mouth daily. , Disp: , Rfl:  .  ONE TOUCH ULTRA TEST test strip, USE TO TEST SUGAR 3 TIMES A DAY, Disp: , Rfl: 12 .  valACYclovir (VALTREX) 1000 MG tablet, Take 1 tablet (1,000 mg total) by mouth 2 (two) times daily., Disp: 20 tablet, Rfl: 0 .  meloxicam (MOBIC) 15 MG tablet, Take 1 tablet (15 mg total) by mouth daily as needed for pain. Stop diclofenac and try tylenol,  Disp: 90 tablet, Rfl: 0  No Known Allergies   ROS  Constitutional: Negative for fever or weight change.  Respiratory: Negative for cough and shortness of breath.   Cardiovascular: Negative for chest pain or palpitations.  Gastrointestinal: Negative for abdominal pain, no bowel changes.  Musculoskeletal: Negative for gait problem or joint swelling.  Skin: Negative  for rash.  Neurological: Negative for dizziness or headache.  No other specific complaints in a complete review of systems (except as listed in HPI above).  Objective  Vitals:   06/05/18 0832  BP: 122/78  Pulse: 74  Resp: 16  Temp: 97.7 F (36.5 C)  TempSrc: Oral  SpO2: 96%  Weight: 246 lb 4.8 oz (111.7 kg)  Height: '5\' 8"'  (1.727 m)    Body mass index is 37.45 kg/m.  Physical Exam  Constitutional: Patient appears well-developed and well-nourished. Obese  No distress.  HEENT: head atraumatic, normocephalic, pupils equal and reactive to light, neck supple, throat within normal limits Cardiovascular: Normal rate, regular rhythm and normal heart sounds.  No murmur heard. No BLE edema. Pulmonary/Chest: Effort normal and breath sounds normal. No respiratory distress. Abdominal: Soft.  There is no tenderness. Psychiatric: Patient has a normal mood and affect. behavior is normal. Judgment and thought content normal. Muscular Skeletal: hammer toe on 2nd left toe, normal rom of lumbar spine. Normal rom of neck   Recent Results (from the past 2160 hour(s))  POCT HgB A1C     Status: Abnormal   Collection Time: 06/05/18  8:38 AM  Result Value Ref Range   Hemoglobin A1C     HbA1c POC (<> result, manual entry)     HbA1c, POC (prediabetic range)     HbA1c, POC (controlled diabetic range) 7.3 (A) 0.0 - 7.0 %    Diabetic Foot Exam: Diabetic Foot Exam - Simple   Simple Foot Form Diabetic Foot exam was performed with the following findings:  Yes 06/05/2018  9:06 AM  Visual Inspection See comments:  Yes Sensation  Testing Intact to touch and monofilament testing bilaterally:  Yes Pulse Check Posterior Tibialis and Dorsalis pulse intact bilaterally:  Yes Comments 2nd left toe hammer toe       PHQ2/9: Depression screen [facility] 2/9 06/05/2018 11/26/2017 08/26/2017 06/09/2017 05/27/2017  Decreased Interest 2 0 0 0 0  Down, Depressed, Hopeless 1 0 0 0 0  PHQ - 2 Score 3 0 0 0 0  Altered sleeping 3 - - - -  Tired, decreased energy 2 - - - -  Change in appetite 1 - - - -  Feeling bad or failure about yourself  0 - - - -  Trouble concentrating 1 - - - -  Moving slowly or fidgety/restless 0 - - - -  Suicidal thoughts 0 - - - -  PHQ-9 Score 10 - - - -  Difficult doing work/chores Somewhat difficult - - - -     Fall Risk: Fall Risk  06/05/2018 03/04/2018 11/26/2017 08/26/2017 06/09/2017  Falls in the past year? No No No No No     Functional Status Survey: Is the patient deaf or have difficulty hearing?: No Does the patient have difficulty seeing, even when wearing glasses/contacts?: Yes(glasses if needed) Does the patient have difficulty concentrating, remembering, or making decisions?: No Does the patient have difficulty walking or climbing stairs?: No Does the patient have difficulty dressing or bathing?: No Does the patient have difficulty doing errands alone such as visiting a doctor's office or shopping?: No    Assessment & Plan  1. Type 2 diabetes mellitus with complication, without long-term current use of insulin (HCC)  Improved  - POCT HgB A1C  2. Gastroesophageal reflux disease without esophagitis  - esomeprazole (NEXIUM) 40 MG capsule; Take 1 capsule (40 mg total) by mouth daily.  Dispense: 90 capsule; Refill: 1  3. Pure hypercholesterolemia  -  atorvastatin (LIPITOR) 10 MG tablet; Take 1 tablet (10 mg total) by mouth daily. Take 1 tablet ( 10 mg total) daily at bedtime.  Dispense: 90 tablet; Refill: 1  4. Dyslipidemia associated with type 2 diabetes mellitus (HCC)  - metFORMIN  (GLUCOPHAGE-XR) 750 MG 24 hr tablet; Take 2 tablets (1,500 mg total) by mouth daily with breakfast.  Dispense: 180 tablet; Refill: 0  5. Morbid obesity ([place])  Lost a couple of pounds   6. Vitamin D insufficiency  Continue vitamin D supplementation   7. History of lumbar discectomy  - meloxicam (MOBIC) 15 MG tablet; Take 1 tablet (15 mg total) by mouth daily as needed for pain. Stop diclofenac and try tylenol  Dispense: 90 tablet; Refill: 0  8. Other osteoarthritis of spine, cervical region  - meloxicam (MOBIC) 15 MG tablet; Take 1 tablet (15 mg total) by mouth daily as needed for pain. Stop diclofenac and try tylenol  Dispense: 90 tablet; Refill: 0  9. Needs flu shot  Refused  10. Snoring  - Ambulatory referral to Sleep Studies  11. Other insomnia  - Ambulatory referral to Sleep Studies

## 2018-06-19 ENCOUNTER — Telehealth: Payer: Self-pay | Admitting: Family Medicine

## 2018-06-19 NOTE — Telephone Encounter (Signed)
Copied from CRM [id number]. Topic: Quick Communication - See Telephone Encounter >> Jun 19, 2018  4:41 PM Jay Schlichter wrote: CRM for notification. See Telephone encounter for: 06/19/18. Received fax from sleep med: In lab sleep study denied by insurance.  Option to do peer to peer by calling [phone], or, change order to home sleep test.  Please advise.

## 2018-06-22 NOTE — Telephone Encounter (Signed)
Can he have it at home?

## 2018-06-24 ENCOUNTER — Telehealth: Payer: Self-pay

## 2018-06-24 NOTE — Telephone Encounter (Signed)
I contacted this patient to inform him that we received a fax from [facility] stating that they do not accept his insurance and that if he wanted to proceed it would cost him $250. He stated that he did not want to go there and told Dr. Carlynn Purl that he preferred [facility] b/c that is where his co-wkers have gone with no problems.  I informed him that I would pass this information along to our referral coordinator and hopefully someone will give him a call with an appt date and time.  He said ok and thanks

## 2018-09-09 ENCOUNTER — Ambulatory Visit (INDEPENDENT_AMBULATORY_CARE_PROVIDER_SITE_OTHER): Payer: BLUE CROSS/BLUE SHIELD | Admitting: Family Medicine

## 2018-09-09 ENCOUNTER — Encounter: Payer: Self-pay | Admitting: Family Medicine

## 2018-09-09 ENCOUNTER — Other Ambulatory Visit: Payer: Self-pay | Admitting: Family Medicine

## 2018-09-09 VITALS — BP 120/94 | HR 87 | Temp 98.2°F | Resp 16 | Ht 68.0 in | Wt 245.6 lb

## 2018-09-09 DIAGNOSIS — K219 Gastro-esophageal reflux disease without esophagitis: Secondary | ICD-10-CM | POA: Diagnosis not present

## 2018-09-09 DIAGNOSIS — Z9889 Other specified postprocedural states: Secondary | ICD-10-CM

## 2018-09-09 DIAGNOSIS — E1169 Type 2 diabetes mellitus with other specified complication: Secondary | ICD-10-CM | POA: Diagnosis not present

## 2018-09-09 DIAGNOSIS — Z23 Encounter for immunization: Secondary | ICD-10-CM

## 2018-09-09 DIAGNOSIS — M47892 Other spondylosis, cervical region: Secondary | ICD-10-CM

## 2018-09-09 DIAGNOSIS — E785 Hyperlipidemia, unspecified: Secondary | ICD-10-CM

## 2018-09-09 DIAGNOSIS — E7800 Pure hypercholesterolemia, unspecified: Secondary | ICD-10-CM

## 2018-09-09 DIAGNOSIS — R0683 Snoring: Secondary | ICD-10-CM

## 2018-09-09 DIAGNOSIS — Z79899 Other long term (current) drug therapy: Secondary | ICD-10-CM

## 2018-09-09 DIAGNOSIS — E118 Type 2 diabetes mellitus with unspecified complications: Secondary | ICD-10-CM

## 2018-09-09 MED ORDER — METFORMIN HCL ER 750 MG PO TB24: 1500.0000 mg | ORAL_TABLET | Freq: Every day | ORAL | 0 refills | Status: DC

## 2018-09-09 NOTE — Telephone Encounter (Signed)
Refill request for general medication.Meloxicam to [facility]  Last office visit 09/09/2018   No follow-ups on file.

## 2018-09-09 NOTE — Progress Notes (Signed)
Name: Trevor Dennis.   MRN: [id number]    DOB: [birth year]   Date:09/09/2018       Progress Note  Subjective  Chief Complaint  Chief Complaint  Patient presents with  . Diabetes  . Hyperlipidemia  . Hypertension    HPI  Diabetes: he used to take Victoza and metformin, but has a higher deductible and stopped January 2019. Last visit his hgbA1C had gone from 7.1% - 7.9%  last visit  7.3%., he changed his diet and still on metformin 1500 mg daily.  He wants to continue life style modification instead of adding another medication at this time. He has dyslipdiemia and is on atorvastatin   Morbid obesity: he lost weight on Victoza, he lost some weight, but he has been off for months  Currently off GLP 1 but still lost 1 lbs with life style modifications  Fatty liver: he states he had a liver biopsy in the past and seen by GI we will continue to monitor for now. 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   Dyslipidemia: calculated his risk and aspirin is not advised. He is on statin therapy   GERD: he takes PPI daily, discussed long term use and risk associated with it.he is doing well at this time with medication.   Snoring: his 16 yo son is complaining of his loud snoring, he also states wakes up 3-4 times per night and feels tired when he wakes up in the morning. Never feels refreshed, his EES score today was 10. He was referred to sleep study but could not get it approved, he will change insurance in January and we will re-submit   Joint pains/back pain/neck pain: he has intermittent pain and stiffness , currently taking meloxicam prn, no radiculitis.   Patient Active Problem List   Diagnosis Date Noted  . History of lumbar discectomy 03/04/2018  . Fatigue 11/26/2017  . Hypotestosteronism 11/26/2017  . History of subarachnoid hemorrhage 09/17/2017  . Vitamin D insufficiency 08/26/2017  . GERD (gastroesophageal reflux disease) 10/25/2016  . Morbid  obesity ([place]) 10/25/2016  . 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 Anxiety 01/01/2016  . Elevated liver enzymes 07/27/2015  . Skin lesion of back 07/27/2015  . Intermittent low back pain 07/27/2015  . Fatty liver 05/03/2015  . Dyslipidemia associated with type 2 diabetes mellitus ([place]) 04/24/2015  . HLD (hyperlipidemia) 04/24/2015  . Fever blister 04/24/2015    Past Surgical History:  Procedure Laterality Date  . BACK SURGERY  8 years ago    Family History  Problem Relation Age of Onset  . Diabetes Mother   . Liver disease Brother   . Alcohol abuse Brother   . ADD / ADHD Son   . ADD / ADHD Son     Social History   Socioeconomic History  . Marital status: Divorced    Spouse name: Not on file  . Number of children: 2  . Years of education: Not on file  . Highest education level: Associate degree: occupational, Hotel manager, or vocational program  Occupational History  . Occupation: owner     Comment: co-owner   Social Needs  . Financial resource strain: Not hard at all  . Food insecurity:    Worry: Never true    Inability: Never true  . Transportation needs:    Medical: No    Non-medical: No  Tobacco Use  . Smoking status: Former Smoker    Packs/day: 1.00  Years: 30.00    Pack years: 30.00    Types: Cigarettes    Last attempt to quit: 06/04/2016    Years since quitting: 2.2  . Smokeless tobacco: Never Used  . Tobacco comment: social smoker like 1 some days  Substance and Sexual Activity  . Alcohol use: Yes    Alcohol/week: 0.0 standard drinks    Comment: Occasional   . Drug use: No  . Sexual activity: Not Currently    Partners: Female  Lifestyle  . Physical activity:    Days per week: 0 days    Minutes per session: 0 min  . Stress: Only a little  Relationships  . Social connections:    Talks on phone: Not on file    Gets together: Not on file    Attends religious service: Not on file    Active member of club or organization: Not  on file    Attends meetings of clubs or organizations: Not on file    Relationship status: Divorced  . Intimate partner violence:    Fear of current or ex partner: No    Emotionally abused: No    Physically abused: No    Forced sexual activity: No  Other Topics Concern  . Not on file  Social History Narrative   Divorced, ex wife used to cheat on him and also was an alcoholic    He has joint custody of his younger son. Her drinking is under better control   Older son is 43 and in the [employer]      Current Outpatient Medications:  .  ALPRAZolam (XANAX) 0.5 MG tablet, Take 1 tablet (0.5 mg total) by mouth daily as needed., Disp: 30 tablet, Rfl: 0 .  atorvastatin (LIPITOR) 10 MG tablet, Take 1 tablet (10 mg total) by mouth daily. Take 1 tablet ( 10 mg total) daily at bedtime., Disp: 90 tablet, Rfl: 1 .  Blood Glucose Monitoring Suppl (ONE TOUCH ULTRA MINI) W/DEVICE KIT, See admin instructions., Disp: , Rfl: 0 .  esomeprazole (NEXIUM) 40 MG capsule, Take 1 capsule (40 mg total) by mouth daily., Disp: 90 capsule, Rfl: 1 .  meloxicam (MOBIC) 15 MG tablet, Take 1 tablet (15 mg total) by mouth daily as needed for pain. Stop diclofenac and try tylenol, Disp: 90 tablet, Rfl: 0 .  metFORMIN (GLUCOPHAGE-XR) 750 MG 24 hr tablet, Take 2 tablets (1,500 mg total) by mouth daily with breakfast., Disp: 180 tablet, Rfl: 0 .  Multiple Vitamin (MULTI VITAMIN DAILY PO), Take by mouth daily. , Disp: , Rfl:  .  ONE TOUCH ULTRA TEST test strip, USE TO TEST SUGAR 3 TIMES A DAY, Disp: , Rfl: 12 .  valACYclovir (VALTREX) 1000 MG tablet, Take 1 tablet (1,000 mg total) by mouth 2 (two) times daily., Disp: 20 tablet, Rfl: 0  No Known Allergies  I personally reviewed active problem list, medication list, allergies, family history, social history with the patient/caregiver today.   ROS  Constitutional: Negative for fever or weight change.  Respiratory: Negative for cough and shortness of breath.   Cardiovascular:  Negative for chest pain or palpitations.  Gastrointestinal: Negative for abdominal pain, no bowel changes.  Musculoskeletal: Negative for gait problem or joint swelling.  Skin: Negative for rash.  Neurological: Negative for dizziness or headache.  No other specific complaints in a complete review of systems (except as listed in HPI above).  Objective  Vitals:   09/09/18 0819  BP: (!) 120/94  Pulse: 87  Resp: 16  Temp: 98.2 F (36.8 C)  TempSrc: Oral  SpO2: 98%  Weight: 245 lb 9.6 oz (111.4 kg)  Height: '5\' 8"'  (1.727 m)    Body mass index is 37.34 kg/m.  Physical Exam  Constitutional: Patient appears well-developed and well-nourished. Obese  No distress.  HEENT: head atraumatic, normocephalic, pupils equal and reactive to light, neck supple, throat within normal limits Cardiovascular: Normal rate, regular rhythm and normal heart sounds.  No murmur heard. No BLE edema. Pulmonary/Chest: Effort normal and breath sounds normal. No respiratory distress. Abdominal: Soft.  There is no tenderness. Psychiatric: Patient has a normal mood and affect. behavior is normal. Judgment and thought content normal.  PHQ2/9: Depression screen [facility] 2/9 06/05/2018 11/26/2017 08/26/2017 06/09/2017 05/27/2017  Decreased Interest 2 0 0 0 0  Down, Depressed, Hopeless 1 0 0 0 0  PHQ - 2 Score 3 0 0 0 0  Altered sleeping 3 - - - -  Tired, decreased energy 2 - - - -  Change in appetite 1 - - - -  Feeling bad or failure about yourself  0 - - - -  Trouble concentrating 1 - - - -  Moving slowly or fidgety/restless 0 - - - -  Suicidal thoughts 0 - - - -  PHQ-9 Score 10 - - - -  Difficult doing work/chores Somewhat difficult - - - -     Fall Risk: Fall Risk  09/09/2018 06/05/2018 03/04/2018 11/26/2017 08/26/2017  Falls in the past year? 0 No No No No     Assessment & Plan  1. Type 2 diabetes mellitus with complication, without long-term current use of insulin (HCC)  - Hemoglobin A1c - metFORMIN  (GLUCOPHAGE-XR) 750 MG 24 hr tablet; Take 2 tablets (1,500 mg total) by mouth daily with breakfast.  Dispense: 180 tablet; Refill: 0  2. Pure hypercholesterolemia  - Lipid panel  3. Gastroesophageal reflux disease without esophagitis  Under control   4. Dyslipidemia associated with type 2 diabetes mellitus (HCC)  - metFORMIN (GLUCOPHAGE-XR) 750 MG 24 hr tablet; Take 2 tablets (1,500 mg total) by mouth daily with breakfast.  Dispense: 180 tablet; Refill: 0  5. Morbid obesity ([place])  Discussed with the patient the risk posed by an increased BMI. Discussed importance of portion control, calorie counting and at least 150 minutes of physical activity weekly. Avoid sweet beverages and drink more water. Eat at least 6 servings of fruit and vegetables daily   6. History of lumbar discectomy   7. Long-term use of high-risk medication  - COMPLETE METABOLIC PANEL WITH GFR - CBC with Differential/Platelet  8. Needs flu shot  Refused   9. Snoring  We tried scheduled a facility and home study but not approved, he will change insurance in January and we will resent order

## 2018-10-15 ENCOUNTER — Telehealth: Payer: Self-pay | Admitting: Family Medicine

## 2018-10-15 DIAGNOSIS — R0683 Snoring: Secondary | ICD-10-CM

## 2018-10-15 DIAGNOSIS — E118 Type 2 diabetes mellitus with unspecified complications: Secondary | ICD-10-CM

## 2018-10-15 NOTE — Telephone Encounter (Signed)
Pt came in today for blood work and brought in his [employer] insurance information. He said that Dr Carlynn PurlSowles had told him to let her know when he came in for his blood work and they would try to get approval with his NEW insurance [employer] for a sleep study for his old insurance kept on denying the sleep study.

## 2018-10-15 NOTE — Telephone Encounter (Signed)
Please sign sleep study.

## 2018-10-16 LAB — CBC WITH DIFFERENTIAL/PLATELET
Absolute Monocytes: 715 cells/uL (ref 200–950)
Basophils Absolute: 110 cells/uL (ref 0–200)
Basophils Relative: 1 %
EOS PCT: 2.5 %
Eosinophils Absolute: 275 cells/uL (ref 15–500)
HEMATOCRIT: 41.3 % (ref 38.5–50.0)
Hemoglobin: 13.9 g/dL (ref 13.2–17.1)
Lymphs Abs: 2684 cells/uL (ref 850–3900)
MCH: 29.8 pg (ref 27.0–33.0)
MCHC: 33.7 g/dL (ref 32.0–36.0)
MCV: 88.4 fL (ref 80.0–100.0)
MONOS PCT: 6.5 %
MPV: 12.3 fL (ref 7.5–12.5)
NEUTROS PCT: 65.6 %
Neutro Abs: 7216 cells/uL (ref 1500–7800)
Platelets: 294 10*3/uL (ref 140–400)
RBC: 4.67 10*6/uL (ref 4.20–5.80)
RDW: 12.4 % (ref 11.0–15.0)
Total Lymphocyte: 24.4 %
WBC: 11 10*3/uL — ABNORMAL HIGH (ref 3.8–10.8)

## 2018-10-16 LAB — COMPLETE METABOLIC PANEL WITH GFR
AG Ratio: 1.5 (calc) (ref 1.0–2.5)
ALBUMIN MSPROF: 4.4 g/dL (ref 3.6–5.1)
ALT: 70 U/L — AB (ref 9–46)
AST: 53 U/L — ABNORMAL HIGH (ref 10–40)
Alkaline phosphatase (APISO): 49 U/L (ref 40–115)
BUN: 16 mg/dL (ref 7–25)
CALCIUM: 9.7 mg/dL (ref 8.6–10.3)
CO2: 28 mmol/L (ref 20–32)
CREATININE: 0.86 mg/dL (ref 0.60–1.35)
Chloride: 102 mmol/L (ref 98–110)
GFR, EST AFRICAN AMERICAN: 121 mL/min/{1.73_m2} (ref >=60)
GFR, EST NON AFRICAN AMERICAN: 105 mL/min/{1.73_m2} (ref >=60)
GLUCOSE: 196 mg/dL — AB (ref 65–99)
Globulin: 2.9 g/dL (calc) (ref 1.9–3.7)
Potassium: 4.8 mmol/L (ref 3.5–5.3)
Sodium: 138 mmol/L (ref 135–146)
Total Bilirubin: 0.6 mg/dL (ref 0.2–1.2)
Total Protein: 7.3 g/dL (ref 6.1–8.1)

## 2018-10-16 LAB — HEMOGLOBIN A1C
EAG (MMOL/L): 11.9 (calc)
Hgb A1c MFr Bld: 9.1 % of total Hgb — ABNORMAL HIGH (ref <5.7)
Mean Plasma Glucose: 214 (calc)

## 2018-10-16 LAB — LIPID PANEL
CHOL/HDL RATIO: 3 (calc) (ref <5.0)
CHOLESTEROL: 140 mg/dL (ref <200)
HDL: 47 mg/dL (ref >40)
LDL CHOLESTEROL (CALC): 69 mg/dL
Non-HDL Cholesterol (Calc): 93 mg/dL (calc) (ref <130)
TRIGLYCERIDES: 161 mg/dL — AB (ref <150)

## 2018-11-13 ENCOUNTER — Telehealth: Payer: Self-pay

## 2018-11-13 NOTE — Telephone Encounter (Signed)
Copied from CRM [phone]. Topic: General - Other >> Nov 12, 2018  1:18 PM Rica KoyanagiWeikart, Barbee CoughMelissa J wrote: Reason for CRM: Please sign and fax back form for sleep med so I can complete referral.  I am still trying to find sleep center that is in network for pt.   Form was signed and faxed on 01/29. I faxed it back to [facility].

## 2018-11-17 ENCOUNTER — Encounter: Payer: Self-pay | Admitting: Family Medicine

## 2018-11-17 ENCOUNTER — Ambulatory Visit (INDEPENDENT_AMBULATORY_CARE_PROVIDER_SITE_OTHER): Payer: PRIVATE HEALTH INSURANCE | Admitting: Family Medicine

## 2018-11-17 VITALS — BP 120/80 | HR 87 | Temp 97.5°F | Resp 16 | Ht 66.0 in | Wt 246.1 lb

## 2018-11-17 DIAGNOSIS — E785 Hyperlipidemia, unspecified: Secondary | ICD-10-CM

## 2018-11-17 DIAGNOSIS — G8929 Other chronic pain: Secondary | ICD-10-CM

## 2018-11-17 DIAGNOSIS — F419 Anxiety disorder, unspecified: Secondary | ICD-10-CM

## 2018-11-17 DIAGNOSIS — E118 Type 2 diabetes mellitus with unspecified complications: Secondary | ICD-10-CM

## 2018-11-17 DIAGNOSIS — E559 Vitamin D deficiency, unspecified: Secondary | ICD-10-CM

## 2018-11-17 DIAGNOSIS — E7800 Pure hypercholesterolemia, unspecified: Secondary | ICD-10-CM

## 2018-11-17 DIAGNOSIS — E1169 Type 2 diabetes mellitus with other specified complication: Secondary | ICD-10-CM

## 2018-11-17 DIAGNOSIS — M47892 Other spondylosis, cervical region: Secondary | ICD-10-CM

## 2018-11-17 DIAGNOSIS — Z9889 Other specified postprocedural states: Secondary | ICD-10-CM

## 2018-11-17 DIAGNOSIS — G4709 Other insomnia: Secondary | ICD-10-CM

## 2018-11-17 DIAGNOSIS — R0683 Snoring: Secondary | ICD-10-CM

## 2018-11-17 DIAGNOSIS — M542 Cervicalgia: Secondary | ICD-10-CM

## 2018-11-17 DIAGNOSIS — K219 Gastro-esophageal reflux disease without esophagitis: Secondary | ICD-10-CM

## 2018-11-17 LAB — POCT GLYCOSYLATED HEMOGLOBIN (HGB A1C): Hemoglobin A1C: 8 % — AB (ref 4.0–5.6)

## 2018-11-17 LAB — POCT GLUCOSE (DEVICE FOR HOME USE): Glucose Fasting, POC: 151 mg/dL — AB (ref 70–99)

## 2018-11-17 MED ORDER — ALPRAZOLAM 0.5 MG PO TABS: 0.5000 mg | ORAL_TABLET | Freq: Every day | ORAL | 0 refills | Status: DC | PRN

## 2018-11-17 MED ORDER — ATORVASTATIN CALCIUM 10 MG PO TABS: 10.0000 mg | ORAL_TABLET | Freq: Every day | ORAL | 1 refills | Status: DC

## 2018-11-17 MED ORDER — ESOMEPRAZOLE MAGNESIUM 40 MG PO CPDR: 40.0000 mg | DELAYED_RELEASE_CAPSULE | Freq: Every day | ORAL | 1 refills | Status: DC

## 2018-11-17 MED ORDER — MELOXICAM 15 MG PO TABS: 15.0000 mg | ORAL_TABLET | Freq: Every day | ORAL | 0 refills | Status: DC | PRN

## 2018-11-17 MED ORDER — DULAGLUTIDE 1.5 MG/0.5ML ~~LOC~~ SOAJ: 1.5000 mg | SUBCUTANEOUS | 1 refills | Status: DC

## 2018-11-17 MED ORDER — METFORMIN HCL ER 750 MG PO TB24: 1500.0000 mg | ORAL_TABLET | Freq: Every day | ORAL | 0 refills | Status: DC

## 2018-11-17 NOTE — Progress Notes (Signed)
Name: Trevor Dennis.   MRN: [id number]    DOB: 08/26/1971   Date:11/17/2018       Progress Note  Subjective  Chief Complaint  Chief Complaint  Patient presents with  . Diabetes  . Hyperlipidemia    HPI  Diabetes Type 2 : he had URI and was treated with prednisone back in December, his glucose spiked at home, and hgbA1C a month ago was up to 9.1%. he states glucose remained high after he finished prednisone. He also went to [place] last week and with all the walking he noticed blister and irritation on both of his feet and between his toes, it was painful and glucose spiked again. His glucose in am's was in the 180-200's range. He has been applying lotrimin to his feet and it is healing now. Glucose is improving, his glucose today was in the 150's range at home and here in our office. He states when glucose is high he noticed polyphagia, polydipsia but no polyuria. He was also not following a very healthy diet.   Chronic neck/Back pain: feeling more stiff with the cold and humid weather, he still taking nsaid's and flexeril prn to control his symptoms. No radiculitis.   Fatty liver; he had [place] in 2018, his last liver enzymes had improved  Hyperlipidemia: his LDL was reviewed and at goal . Doing well. No myalgia.   Morbid Obesity: he states trying to cut down on portion size, he responded to Ozempic, but not under his plan, we will try Trulicity, also discussed importance of physical activity and healthy diet   GERD: he takes PPI daily, discussed long term use and risk associated with it.he is doing well at this time with medication.   Snoring: his 56 yo son is complaining of his loud snoring, he also states wakes up 3-4 times per night and feels tired when he wakes up in the morning. Never feels refreshed, his EES score was 10 back in Nov . He was referred to sleep study but could not get it approved, he has a new insurance but needs to have a sleep study   Patient Active Problem List    Diagnosis Date Noted  . Chronic neck pain 11/17/2018  . History of lumbar discectomy 03/04/2018  . Fatigue 11/26/2017  . Hypotestosteronism 11/26/2017  . History of subarachnoid hemorrhage 09/17/2017  . Vitamin D insufficiency 08/26/2017  . GERD (gastroesophageal reflux disease) 10/25/2016  . Morbid obesity ([place]) 10/25/2016  . 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 Anxiety 01/01/2016  . Elevated liver enzymes 07/27/2015  . Skin lesion of back 07/27/2015  . Intermittent low back pain 07/27/2015  . Fatty liver 05/03/2015  . Dyslipidemia associated with type 2 diabetes mellitus ([place]) 04/24/2015  . HLD (hyperlipidemia) 04/24/2015  . Fever blister 04/24/2015    Past Surgical History:  Procedure Laterality Date  . BACK SURGERY  8 years ago    Family History  Problem Relation Age of Onset  . Diabetes Mother   . Liver disease Brother   . Alcohol abuse Brother   . ADD / ADHD Son   . ADD / ADHD Son     Social History   Socioeconomic History  . Marital status: Divorced    Spouse name: Not on file  . Number of children: 2  . Years of education: Not on file  . Highest education level: Associate degree: occupational, Hotel manager, or vocational program  Occupational History  . Occupation: Financial controller  Comment: co-owner   Social Needs  . Financial resource strain: Not hard at all  . Food insecurity:    Worry: Never true    Inability: Never true  . Transportation needs:    Medical: No    Non-medical: No  Tobacco Use  . Smoking status: Former Smoker    Packs/day: 1.00    Years: 30.00    Pack years: 30.00    Types: Cigarettes    Last attempt to quit: 06/04/2016    Years since quitting: 2.4  . Smokeless tobacco: Never Used  . Tobacco comment: social smoker like 1 some days  Substance and Sexual Activity  . Alcohol use: Yes    Alcohol/week: 0.0 standard drinks    Comment: Occasional   . Drug use: No  . Sexual activity: Not Currently    Partners: Female   Lifestyle  . Physical activity:    Days per week: 0 days    Minutes per session: 0 min  . Stress: Only a little  Relationships  . Social connections:    Talks on phone: Not on file    Gets together: Not on file    Attends religious service: Not on file    Active member of club or organization: Not on file    Attends meetings of clubs or organizations: Not on file    Relationship status: Divorced  . Intimate partner violence:    Fear of current or ex partner: No    Emotionally abused: No    Physically abused: No    Forced sexual activity: No  Other Topics Concern  . Not on file  Social History Narrative   Divorced, ex wife used to cheat on him and also was an alcoholic    He has joint custody of his younger son. Her drinking is under better control   Older son is 73 and in the [employer]      Current Outpatient Medications:  .  ALPRAZolam (XANAX) 0.5 MG tablet, Take 1 tablet (0.5 mg total) by mouth daily as needed., Disp: 30 tablet, Rfl: 0 .  atorvastatin (LIPITOR) 10 MG tablet, Take 1 tablet (10 mg total) by mouth daily. Take 1 tablet ( 10 mg total) daily at bedtime., Disp: 90 tablet, Rfl: 1 .  Blood Glucose Monitoring Suppl (ONE TOUCH ULTRA MINI) W/DEVICE KIT, See admin instructions., Disp: , Rfl: 0 .  esomeprazole (NEXIUM) 40 MG capsule, Take 1 capsule (40 mg total) by mouth daily., Disp: 90 capsule, Rfl: 1 .  meloxicam (MOBIC) 15 MG tablet, Take 1 tablet (15 mg total) by mouth daily as needed for pain. Stop diclofenac and try tylenol, Disp: 90 tablet, Rfl: 0 .  metFORMIN (GLUCOPHAGE-XR) 750 MG 24 hr tablet, Take 2 tablets (1,500 mg total) by mouth daily with breakfast., Disp: 180 tablet, Rfl: 0 .  Multiple Vitamin (MULTI VITAMIN DAILY PO), Take by mouth daily. , Disp: , Rfl:  .  ONE TOUCH ULTRA TEST test strip, USE TO TEST SUGAR 3 TIMES A DAY, Disp: , Rfl: 12 .  valACYclovir (VALTREX) 1000 MG tablet, Take 1 tablet (1,000 mg total) by mouth 2 (two) times daily., Disp: 20 tablet,  Rfl: 0  No Known Allergies  I personally reviewed active problem list, medication list, allergies, family history with the patient/caregiver today.   ROS  Constitutional: Negative for fever or weight change.  Respiratory: Negative for cough and shortness of breath.   Cardiovascular: Negative for chest pain or palpitations.  Gastrointestinal: Negative for abdominal pain,  no bowel changes.  Musculoskeletal: Negative for gait problem or joint swelling.  Skin: Negative for rash.  Neurological: Negative for dizziness or headache.  No other specific complaints in a complete review of systems (except as listed in HPI above).  Objective  Vitals:   11/17/18 0753  BP: 120/80  Pulse: 87  Resp: 16  Temp: (!) 97.5 F (36.4 C)  TempSrc: Oral  SpO2: 98%  Weight: 246 lb 1.6 oz (111.6 kg)  Height: _0  (1.676 m)    Body mass index is 39.72 kg/m.  Physical Exam  Constitutional: Patient appears well-developed and well-nourished. ObeseNo distress.  HEENT: head atraumatic, normocephalic, pupils equal and reactive to light, neck supple, throat within normal limits Cardiovascular: Normal rate, regular rhythm and normal heart sounds.  No murmur heard. No BLE edema. Pulmonary/Chest: Effort normal and breath sounds normal. No respiratory distress. Abdominal: Soft.  There is no tenderness. Psychiatric: Patient has a normal mood and affect. behavior is normal. Judgment and thought content normal.   Recent Results (from the past 2160 hour(s))  COMPLETE METABOLIC PANEL WITH GFR     Status: Abnormal   Collection Time: 10/15/18  9:57 AM  Result Value Ref Range   Glucose, Bld 196 (H) 65 - 99 mg/dL    Comment: .            Fasting reference interval . For someone without known diabetes, a glucose value >125 mg/dL indicates that they may have diabetes and this should be confirmed with a follow-up test. .    BUN 16 7 - 25 mg/dL   Creat 0.86 0.60 - 1.35 mg/dL   GFR, Est Non African American  105 > OR = 60 mL/min/1.81m   GFR, Est African American 121 > OR = 60 mL/min/1.751m  BUN/Creatinine Ratio NOT APPLICABLE 6 - 22 (calc)   Sodium 138 135 - 146 mmol/L   Potassium 4.8 3.5 - 5.3 mmol/L   Chloride 102 98 - 110 mmol/L   CO2 28 20 - 32 mmol/L   Calcium 9.7 8.6 - 10.3 mg/dL   Total Protein 7.3 6.1 - 8.1 g/dL   Albumin 4.4 3.6 - 5.1 g/dL   Globulin 2.9 1.9 - 3.7 g/dL (calc)   AG Ratio 1.5 1.0 - 2.5 (calc)   Total Bilirubin 0.6 0.2 - 1.2 mg/dL   Alkaline phosphatase (APISO) 49 40 - 115 U/L   AST 53 (H) 10 - 40 U/L   ALT 70 (H) 9 - 46 U/L  Hemoglobin A1c     Status: Abnormal   Collection Time: 10/15/18  9:57 AM  Result Value Ref Range   Hgb A1c MFr Bld 9.1 (H) <5.7 % of total Hgb    Comment: For someone without known diabetes, a hemoglobin A1c value of 6.5% or greater indicates that they may have  diabetes and this should be confirmed with a follow-up  test. . For someone with known diabetes, a value <7% indicates  that their diabetes is well controlled and a value  greater than or equal to 7% indicates suboptimal  control. A1c targets should be individualized based on  duration of diabetes, age, comorbid conditions, and  other considerations. . Currently, no consensus exists regarding use of hemoglobin A1c for diagnosis of diabetes for children. .    Mean Plasma Glucose 214 (calc)   eAG (mmol/L) 11.9 (calc)  Lipid panel     Status: Abnormal   Collection Time: 10/15/18  9:57 AM  Result Value Ref Range  Cholesterol 140 <200 mg/dL   HDL 47 >40 mg/dL   Triglycerides 161 (H) <150 mg/dL   LDL Cholesterol (Calc) 69 mg/dL (calc)    Comment: Reference range: <100 . Desirable range <100 mg/dL for primary prevention;   <70 mg/dL for patients with CHD or diabetic patients  with > or = 2 CHD risk factors. Marland Kitchen LDL-C is now calculated using the Martin-Hopkins  calculation, which is a validated novel method providing  better accuracy than the Friedewald equation in the   estimation of LDL-C.  Cresenciano Genre et al. Annamaria Helling. 7530;051(10): 2061-2068  (http://education.[facility].com/faq/FAQ164)    Total CHOL/HDL Ratio 3.0 <5.0 (calc)   Non-HDL Cholesterol (Calc) 93 <130 mg/dL (calc)    Comment: For patients with diabetes plus 1 major ASCVD risk  factor, treating to a non-HDL-C goal of <100 mg/dL  (LDL-C of <70 mg/dL) is considered a therapeutic  option.   CBC with Differential/Platelet     Status: Abnormal   Collection Time: 10/15/18  9:57 AM  Result Value Ref Range   WBC 11.0 (H) 3.8 - 10.8 Thousand/uL   RBC 4.67 4.20 - 5.80 Million/uL   Hemoglobin 13.9 13.2 - 17.1 g/dL   HCT 41.3 38.5 - 50.0 %   MCV 88.4 80.0 - 100.0 fL   MCH 29.8 27.0 - 33.0 pg   MCHC 33.7 32.0 - 36.0 g/dL   RDW 12.4 11.0 - 15.0 %   Platelets 294 140 - 400 Thousand/uL   MPV 12.3 7.5 - 12.5 fL   Neutro Abs 7,216 1,500 - 7,800 cells/uL   Lymphs Abs 2,684 850 - 3,900 cells/uL   Absolute Monocytes 715 200 - 950 cells/uL   Eosinophils Absolute 275 15 - 500 cells/uL   Basophils Absolute 110 0 - 200 cells/uL   Neutrophils Relative % 65.6 %   Total Lymphocyte 24.4 %   Monocytes Relative 6.5 %   Eosinophils Relative 2.5 %   Basophils Relative 1.0 %  POCT Glucose (Device for Home Use)     Status: Abnormal   Collection Time: 11/17/18  8:08 AM  Result Value Ref Range   Glucose Fasting, POC 151 (A) 70 - 99 mg/dL   POC Glucose    POCT HgB A1C     Status: Abnormal   Collection Time: 11/17/18  8:14 AM  Result Value Ref Range   Hemoglobin A1C 8.0 (A) 4.0 - 5.6 %   HbA1c POC (<> result, manual entry)     HbA1c, POC (prediabetic range)     HbA1c, POC (controlled diabetic range)      Diabetic Foot Exam: Diabetic Foot Exam - Simple   Simple Foot Form Visual Inspection See comments:  Yes Sensation Testing Intact to touch and monofilament testing bilaterally:  Yes Pulse Check Posterior Tibialis and Dorsalis pulse intact bilaterally:  Yes Comments Some scabs on his foot, between  toes and on top of toes, from irritation / he was at [facility] resolving now     PHQ2/9: Depression screen [facility] 2/9 11/17/2018 09/09/2018 06/05/2018 11/26/2017 08/26/2017  Decreased Interest 0 0 2 0 0  Down, Depressed, Hopeless 0 0 1 0 0  PHQ - 2 Score 0 0 3 0 0  Altered sleeping _0 - -  Tired, decreased energy _1 - -  Change in appetite 0 0 1 - -  Feeling bad or failure about yourself  0 0 0 - -  Trouble concentrating 0 0 1 - -  Moving slowly or fidgety/restless 0 0 0 - -  Suicidal thoughts 0 0 0 - -  PHQ-9 Score _0 - -  Difficult doing work/chores Somewhat difficult Very difficult Somewhat difficult - -     Fall Risk: Fall Risk  11/17/2018 09/09/2018 06/05/2018 03/04/2018 11/26/2017  Falls in the past year? 0 0 No No No  Number falls in past yr: 0 - - - -  Injury with Fall? 0 - - - -      Assessment & Plan  1. Type 2 diabetes mellitus with complication, without long-term current use of insulin (HCC)  - POCT Glucose (Device for Home Use) - POCT HgB A1C - Dulaglutide (TRULICITY) 1.5 XV/4.0GQ SOPN; Inject 1.5 mg into the skin once a week.  Dispense: 4 pen; Refill: 1 - Home sleep test  2. Chronic neck pain  Continue medication   3. Dyslipidemia associated with type 2 diabetes mellitus (HCC)  - atorvastatin (LIPITOR) 10 MG tablet; Take 1 tablet (10 mg total) by mouth daily. Take 1 tablet ( 10 mg total) daily at bedtime.  Dispense: 90 tablet; Refill: 1 - metFORMIN (GLUCOPHAGE-XR) 750 MG 24 hr tablet; Take 2 tablets (1,500 mg total) by mouth daily with breakfast.  Dispense: 180 tablet; Refill: 0   4. Morbid obesity ([place])  - Home sleep test  5. Vitamin D insufficiency   6. Snoring  - Home sleep test  7. Other insomnia   8. Gastroesophageal reflux disease without esophagitis  - esomeprazole (NEXIUM) 40 MG capsule; Take 1 capsule (40 mg total) by mouth daily.  Dispense: 90 capsule; Refill: 1  9. Anxiety  - ALPRAZolam (XANAX) 0.5 MG tablet; Take 1 tablet (0.5  mg total) by mouth daily as needed.  Dispense: 30 tablet; Refill: 0  10. Pure hypercholesterolemia  - atorvastatin (LIPITOR) 10 MG tablet; Take 1 tablet (10 mg total) by mouth daily. Take 1 tablet ( 10 mg total) daily at bedtime.  Dispense: 90 tablet; Refill: 1  11. History of lumbar discectomy  - meloxicam (MOBIC) 15 MG tablet; Take 1 tablet (15 mg total) by mouth daily as needed for pain. Stop diclofenac and try tylenol  Dispense: 90 tablet; Refill: 0  12. Other osteoarthritis of spine, cervical region  - meloxicam (MOBIC) 15 MG tablet; Take 1 tablet (15 mg total) by mouth daily as needed for pain. Stop diclofenac and try tylenol  Dispense: 90 tablet; Refill: 0  Discussed sleep hygiene again

## 2018-11-17 NOTE — Patient Instructions (Signed)
Trevor Dennis

## 2018-11-18 ENCOUNTER — Other Ambulatory Visit: Payer: Self-pay

## 2018-11-18 DIAGNOSIS — E118 Type 2 diabetes mellitus with unspecified complications: Secondary | ICD-10-CM

## 2018-11-18 MED ORDER — DULAGLUTIDE 1.5 MG/0.5ML ~~LOC~~ SOAJ: 1.5000 mg | SUBCUTANEOUS | 1 refills | Status: DC

## 2018-11-18 NOTE — Telephone Encounter (Signed)
Prior Authorization was initiated for Trulicity and Approved. The medication is approved for 365 days.

## 2018-11-24 ENCOUNTER — Encounter: Payer: Self-pay | Admitting: Family Medicine

## 2018-11-24 ENCOUNTER — Ambulatory Visit (INDEPENDENT_AMBULATORY_CARE_PROVIDER_SITE_OTHER): Payer: PRIVATE HEALTH INSURANCE | Admitting: Family Medicine

## 2018-11-24 VITALS — BP 138/76 | HR 69 | Temp 98.3°F | Resp 16 | Ht 66.0 in | Wt 242.0 lb

## 2018-11-24 DIAGNOSIS — R05 Cough: Secondary | ICD-10-CM

## 2018-11-24 DIAGNOSIS — R6889 Other general symptoms and signs: Secondary | ICD-10-CM | POA: Diagnosis not present

## 2018-11-24 DIAGNOSIS — E118 Type 2 diabetes mellitus with unspecified complications: Secondary | ICD-10-CM | POA: Diagnosis not present

## 2018-11-24 DIAGNOSIS — J069 Acute upper respiratory infection, unspecified: Secondary | ICD-10-CM

## 2018-11-24 DIAGNOSIS — R059 Cough, unspecified: Secondary | ICD-10-CM

## 2018-11-24 LAB — POCT INFLUENZA A/B
INFLUENZA B, POC: NEGATIVE
Influenza A, POC: NEGATIVE

## 2018-11-24 MED ORDER — BUDESONIDE-FORMOTEROL FUMARATE 80-4.5 MCG/ACT IN AERO: 2.0000 | INHALATION_SPRAY | Freq: Two times a day (BID) | RESPIRATORY_TRACT | 3 refills | Status: DC

## 2018-11-24 MED ORDER — ALBUTEROL SULFATE HFA 108 (90 BASE) MCG/ACT IN AERS: 2.0000 | INHALATION_SPRAY | Freq: Four times a day (QID) | RESPIRATORY_TRACT | 2 refills | Status: DC | PRN

## 2018-11-24 MED ORDER — DM-GUAIFENESIN ER 30-600 MG PO TB12: ORAL_TABLET | ORAL | 0 refills | Status: DC

## 2018-11-24 MED ORDER — BENZONATATE 100 MG PO CAPS: 100.0000 mg | ORAL_CAPSULE | Freq: Two times a day (BID) | ORAL | 0 refills | Status: DC | PRN

## 2018-11-24 NOTE — Progress Notes (Addendum)
Acute Office Visit  Subjective:    Patient ID: Trevor Dennis., male    DOB: [birth year], 48 y.o.   MRN: [id number]  Chief Complaint  Patient presents with  . URI    achiness and slight fever.    Discussed sick   URI   This is a new problem. The current episode started in the past 7 days. The problem has been gradually worsening. The maximum temperature recorded prior to his arrival was 101 - 101.9 F. The fever has been present for 3 to 4 days (He has been in contact with sickness from his wife. She has been diagnosed with the flu. His son has also been sick with upper respiratory infection.). Associated symptoms include congestion, coughing, ear pain, nausea, a sore throat and wheezing. Pertinent negatives include no chest pain (some discomfort with cough and deep inspiration only), headaches, neck pain, rhinorrhea, sinus pain or swollen glands. He has tried decongestant for the symptoms. The treatment provided mild relief.  Diabetes  He has type 2 (Discussed sick day care and increased risk for infection with uncontrolled DM.  He has taken prednisone in the past and this caused significant hyperglycemia, so we wil avoid today.) diabetes mellitus. His disease course has been improving. Pertinent negatives for hypoglycemia include no headaches. Pertinent negatives for diabetes include no chest pain (some discomfort with cough and deep inspiration only). He is compliant with treatment all of the time. His weight is stable. He is following a generally healthy diet. His breakfast blood glucose range is generally 130-140 mg/dl.    Past Medical History:  Diagnosis Date  . Anxiety   . Arthritis   . Back ache 07/27/2015  . Diabetes mellitus without complication [facility])     Past Surgical History:  Procedure Laterality Date  . BACK SURGERY  8 years ago    Family History  Problem Relation Age of Onset  . Diabetes Mother   . Liver disease Brother   . Alcohol abuse Brother   . ADD / ADHD  Son   . ADD / ADHD Son     Social History   Socioeconomic History  . Marital status: Divorced    Spouse name: Not on file  . Number of children: 2  . Years of education: Not on file  . Highest education level: Associate degree: occupational, Hotel manager, or vocational program  Occupational History  . Occupation: owner     Comment: co-owner   Social Needs  . Financial resource strain: Not hard at all  . Food insecurity:    Worry: Never true    Inability: Never true  . Transportation needs:    Medical: No    Non-medical: No  Tobacco Use  . Smoking status: Former Smoker    Packs/day: 1.00    Years: 30.00    Pack years: 30.00    Types: Cigarettes    Last attempt to quit: 06/04/2016    Years since quitting: 2.4  . Smokeless tobacco: Never Used  . Tobacco comment: social smoker like 1 some days  Substance and Sexual Activity  . Alcohol use: Yes    Alcohol/week: 0.0 standard drinks    Comment: Occasional   . Drug use: No  . Sexual activity: Not Currently    Partners: Female  Lifestyle  . Physical activity:    Days per week: 0 days    Minutes per session: 0 min  . Stress: Only a little  Relationships  . Social connections:  Talks on phone: Not on file    Gets together: Not on file    Attends religious service: Not on file    Active member of club or organization: Not on file    Attends meetings of clubs or organizations: Not on file    Relationship status: Divorced  . Intimate partner violence:    Fear of current or ex partner: No    Emotionally abused: No    Physically abused: No    Forced sexual activity: No  Other Topics Concern  . Not on file  Social History Narrative   Divorced, ex wife used to cheat on him and also was an alcoholic    He has joint custody of his younger son. Her drinking is under better control   Older son is 18 and in the [employer]     Outpatient Medications Prior to Visit  Medication Sig Dispense Refill  . ALPRAZolam (XANAX) 0.5 MG  tablet Take 1 tablet (0.5 mg total) by mouth daily as needed. 30 tablet 0  . atorvastatin (LIPITOR) 10 MG tablet Take 1 tablet (10 mg total) by mouth daily. Take 1 tablet ( 10 mg total) daily at bedtime. 90 tablet 1  . Blood Glucose Monitoring Suppl (ONE TOUCH ULTRA MINI) W/DEVICE KIT See admin instructions.  0  . Dulaglutide (TRULICITY) 1.5 HL/4.5GY SOPN Inject 1.5 mg into the skin once a week. 4 pen 1  . esomeprazole (NEXIUM) 40 MG capsule Take 1 capsule (40 mg total) by mouth daily. 90 capsule 1  . meloxicam (MOBIC) 15 MG tablet Take 1 tablet (15 mg total) by mouth daily as needed for pain. Stop diclofenac and try tylenol 90 tablet 0  . metFORMIN (GLUCOPHAGE-XR) 750 MG 24 hr tablet Take 2 tablets (1,500 mg total) by mouth daily with breakfast. 180 tablet 0  . Multiple Vitamin (MULTI VITAMIN DAILY PO) Take by mouth daily.     . ONE TOUCH ULTRA TEST test strip USE TO TEST SUGAR 3 TIMES A DAY  12  . valACYclovir (VALTREX) 1000 MG tablet Take 1 tablet (1,000 mg total) by mouth 2 (two) times daily. 20 tablet 0   No facility-administered medications prior to visit.     No Known Allergies  Review of Systems  HENT: Positive for congestion, ear pain and sore throat. Negative for rhinorrhea and sinus pain.   Respiratory: Positive for cough and wheezing.   Cardiovascular: Negative for chest pain (some discomfort with cough and deep inspiration only).  Gastrointestinal: Positive for nausea.  Musculoskeletal: Negative for neck pain.  Neurological: Negative for headaches.      Objective:    Physical Exam  Constitutional: He is oriented to person, place, and time. He appears well-developed and well-nourished. No distress.  HENT:  Head: Normocephalic and atraumatic.  Right Ear: Tympanic membrane, external ear and ear canal normal.  Left Ear: External ear and ear canal normal. A middle ear effusion is present.  Nose: Nose normal. No mucosal edema or rhinorrhea. Right sinus exhibits no maxillary  sinus tenderness and no frontal sinus tenderness. Left sinus exhibits no maxillary sinus tenderness and no frontal sinus tenderness.  Mouth/Throat: Uvula is midline and mucous membranes are normal. Posterior oropharyngeal erythema present. No oropharyngeal exudate, posterior oropharyngeal edema or tonsillar abscesses.  Eyes: Pupils are equal, round, and reactive to light. Conjunctivae and EOM are normal. No scleral icterus.  Neck: Normal range of motion and full passive range of motion without pain. Neck supple. Normal range of motion present.  Cardiovascular: Normal rate, regular rhythm and normal heart sounds. Exam reveals no friction rub.  No murmur heard. Pulmonary/Chest: Effort normal and breath sounds normal. No respiratory distress. He has no wheezes. He has no rales.  Musculoskeletal: Normal range of motion.        General: No deformity or edema.  Lymphadenopathy:    He has no cervical adenopathy.  Neurological: He is alert and oriented to person, place, and time. No cranial nerve deficit.  Skin: Skin is warm and dry. No rash noted. He is not diaphoretic.  Psychiatric: He has a normal mood and affect. His behavior is normal. Judgment and thought content normal.  Nursing note and vitals reviewed.  BP 138/76 (BP Location: Left Arm, Patient Position: Sitting, Cuff Size: Normal)   Pulse 69   Temp 98.3 F (36.8 C) (Oral)   Resp 16   Ht _0  (1.676 m)   Wt 242 lb (109.8 kg)   SpO2 94%   BMI 39.06 kg/m  Wt Readings from Last 3 Encounters:  11/24/18 242 lb (109.8 kg)  11/17/18 246 lb 1.6 oz (111.6 kg)  09/09/18 245 lb 9.6 oz (111.4 kg)   Health Maintenance Due  Topic Date Due  . OPHTHALMOLOGY EXAM  11/12/2017   There are no preventive care reminders to display for this patient.  Lab Results  Component Value Date   TSH 2.57 06/09/2017   Lab Results  Component Value Date   WBC 11.0 (H) 10/15/2018   HGB 13.9 10/15/2018   HCT 41.3 10/15/2018   MCV 88.4 10/15/2018   PLT 294  10/15/2018   Lab Results  Component Value Date   NA 138 10/15/2018   K 4.8 10/15/2018   CO2 28 10/15/2018   GLUCOSE 196 (H) 10/15/2018   BUN 16 10/15/2018   CREATININE 0.86 10/15/2018   BILITOT 0.6 10/15/2018   ALKPHOS 43 05/27/2017   AST 53 (H) 10/15/2018   ALT 70 (H) 10/15/2018   PROT 7.3 10/15/2018   ALBUMIN 4.5 05/27/2017   CALCIUM 9.7 10/15/2018   Lab Results  Component Value Date   CHOL 140 10/15/2018   Lab Results  Component Value Date   HDL 47 10/15/2018   Lab Results  Component Value Date   LDLCALC 69 10/15/2018   Lab Results  Component Value Date   TRIG 161 (H) 10/15/2018   Lab Results  Component Value Date   CHOLHDL 3.0 10/15/2018   Lab Results  Component Value Date   HGBA1C 8.0 (A) 11/17/2018      Assessment & Plan:   Problem List Items Addressed This Visit    None    Visit Diagnoses    URI, acute    -  Primary   Relevant Medications   dextromethorphan-guaiFENesin (MUCINEX DM) 30-600 MG 12hr tablet   benzonatate (TESSALON) 100 MG capsule   albuterol (PROVENTIL HFA;VENTOLIN HFA) 108 (90 Base) MCG/ACT inhaler   budesonide-formoterol (SYMBICORT) 80-4.5 MCG/ACT inhaler   Flu-like symptoms       Relevant Orders   POCT Influenza A/B (Completed)   Type 2 diabetes mellitus with complication, without long-term current use of insulin (HCC)       Cough       Relevant Medications   albuterol (PROVENTIL HFA;VENTOLIN HFA) 108 (90 Base) MCG/ACT inhaler   budesonide-formoterol (SYMBICORT) 80-4.5 MCG/ACT inhaler     Sick day care for DM is discussed in detail, see AVS.  We will avoid Oral steroids for now and trial Symbicort. Recommend humidifier.  Meds ordered this encounter  Medications  . dextromethorphan-guaiFENesin (MUCINEX DM) 30-600 MG 12hr tablet    Sig: Take 1 tab every 6 hours.    Dispense:  30 tablet    Refill:  0  . benzonatate (TESSALON) 100 MG capsule    Sig: Take 1 capsule (100 mg total) by mouth 2 (two) times daily as needed for  cough.    Dispense:  20 capsule    Refill:  0  . albuterol (PROVENTIL HFA;VENTOLIN HFA) 108 (90 Base) MCG/ACT inhaler    Sig: Inhale 2 puffs into the lungs every 6 (six) hours as needed for wheezing or shortness of breath.    Dispense:  1 Inhaler    Refill:  2  . budesonide-formoterol (SYMBICORT) 80-4.5 MCG/ACT inhaler    Sig: Inhale 2 puffs into the lungs 2 (two) times daily.    Dispense:  1 Inhaler    Refill:  3    Order Specific Question:   Supervising Provider    Answer:   Steele Sizer [[id number]]   This note was transcribed by Cristy Folks, CMA under the supervision of Raelyn Ensign, NP.

## 2018-11-24 NOTE — Patient Instructions (Addendum)
Take extra strength Tylenol 1000 mg every 6 hours and alternate with Ibuprofen 600 mg.   Upper Respiratory Infection, Adult An upper respiratory infection (URI) affects the nose, throat, and upper air passages. URIs are caused by germs (viruses). The most common type of URI is often called "the common cold." Medicines cannot cure URIs, but you can do things at home to relieve your symptoms. URIs usually get better within 7-10 days. Follow these instructions at home: Activity Rest as needed. If you have a fever, stay home from work or school until your fever is gone, or until your doctor says you may return to work or school. You should stay home until you cannot spread the infection anymore (you are not contagious). Your doctor may have you wear a face mask so you have less risk of spreading the infection. Relieving symptoms Gargle with a salt-water mixture 3-4 times a day or as needed. To make a salt-water mixture, completely dissolve -1 tsp of salt in 1 cup of warm water. Use a cool-mist humidifier to add moisture to the air. This can help you breathe more easily. Eating and drinking  Drink enough fluid to keep your pee (urine) pale yellow. Eat soups and other clear broths. General instructions  Take over-the-counter and prescription medicines only as told by your doctor. These include cold medicines, fever reducers, and cough suppressants. Do not use any products that contain nicotine or tobacco. These include cigarettes and e-cigarettes. If you need help quitting, ask your doctor. Avoid being where people are smoking (avoid secondhand smoke). Make sure you get regular shots and get the flu shot every year. Keep all follow-up visits as told by your doctor. This is important. How to avoid spreading infection to others  Wash your hands often with soap and water. If you do not have soap and water, use hand sanitizer. Avoid touching your mouth, face, eyes, or nose. Cough or sneeze into a  tissue or your sleeve or elbow. Do not cough or sneeze into your hand or into the air. Contact a doctor if: You are getting worse, not better. You have any of these: A fever. Chills. Brown or red mucus in your nose. Yellow or brown fluid (discharge)coming from your nose. Pain in your face, especially when you bend forward. Swollen neck glands. Pain with swallowing. White areas in the back of your throat. Get help right away if: You have shortness of breath that gets worse. You have very bad or constant: Headache. Ear pain. Pain in your forehead, behind your eyes, and over your cheekbones (sinus pain). Chest pain. You have long-lasting (chronic) lung disease along with any of these: Wheezing. Long-lasting cough. Coughing up blood. A change in your usual mucus. You have a stiff neck. You have changes in your: Vision. Hearing. Thinking. Mood. Summary An upper respiratory infection (URI) is caused by a germ called a virus. The most common type of URI is often called "the common cold." URIs usually get better within 7-10 days. Take over-the-counter and prescription medicines only as told by your doctor. This information is not intended to replace advice given to you by your health care provider. Make sure you discuss any questions you have with your health care provider. Document Released: 03/18/2008 Document Revised: 05/23/2017 Document Reviewed: 05/23/2017 Elsevier Interactive Patient Education  2019 [employer]  Diabetes Mellitus and Sick Day Management Blood sugar (glucose) can be difficult to control when you are sick. Common illnesses that can cause problems for people with diabetes (  diabetes mellitus) include colds, fever, flu (influenza), nausea, vomiting, and diarrhea. These illnesses can cause stress and loss of body fluids (dehydration), and those issues can cause blood glucose levels to increase. Because of this, it is very important to take your insulin and diabetes  medicines and eat some form of carbohydrate when you are sick. You should make a plan for days when you are sick (sick day plan) as part of your diabetes management plan. You and your health care provider should make this plan in advance. The following guidelines are intended to help you manage an illness that lasts for about 24 hours or less. Your health care provider may also give you more specific instructions. What do I need to do to manage my blood glucose?   Check your blood glucose every 2-4 hours, or as often as told by your health care provider.  Know your sick day treatment goals. Your target blood glucose levels may be different when you are sick.  If you use insulin, take your usual dose. ? If your blood glucose continues to be too high, you may need to take an additional insulin dose as told by your health care provider.  If you use oral diabetes medicine, you may need to stop taking it if you are not able to eat or drink normally. Ask your health care provider about whether you need to stop taking these medicines while you are sick.  If you use injectable hormone medicines other than insulin to control your diabetes, ask your health care provider about whether you need to stop taking these medicines while you are sick. What else can I do to manage my diabetes when I am sick? Check your ketones  If you have type 1 diabetes, check your urine ketones every 4 hours.  If you have type 2 diabetes, check your urine ketones as often as told by your health care provider. Drink fluids  Drink enough fluid to keep your urine clear or pale yellow. This is especially important if you have a fever, vomiting, or diarrhea. Those symptoms can lead to dehydration.  Follow any instructions from your health care provider about beverages to avoid. ? Do not drink alcohol, caffeine, or drinks that contain a lot of sugar. Take medicines as directed  Take-over-the-counter and prescription medicines  only as told by your health care provider.  Check medicine labels for added sugars. Some medicines may contain sugar or types of sugars that can raise your blood glucose level. What foods can I eat when I am sick?  You need to eat some form of carbohydrates when you are sick. You should eat 45-50 grams (45-50 g) of carbohydrates every 3-4 hours until you feel better. All of the food choices below contain about 15 g of carbohydrates. Plan ahead and keep some of these foods around so you have them if you get sick.  4-6 oz (120-177 mL) carbonated beverage that contains sugar, such as regular (not diet) soda. You may be able to drink carbonated beverages more easily if you open the beverage and let it sit at room temperature for a few minutes before drinking.   of a twin frozen ice pop.  4 oz (120 g) regular gelatin.  4 oz (120 mL) fruit juice.  4 oz (120 g) ice cream or frozen yogurt.  2 oz (60 g) sherbet.  8 oz (240 mL) clear broth or soup.  4 oz (120 g) regular custard.  4 oz (120 g) regular pudding.  8 oz (240 g) plain yogurt.  1 slice bread or toast.  6 saltine crackers.  5 vanilla wafers. Questions to ask your health care provider Consider asking the following questions so you know what to do on days when you are sick:  Should I adjust my diabetes medicines?  How often do I need to check my blood glucose?  What supplies do I need to manage my diabetes at home when I am sick?  What number can I call if I have questions?  What foods and drinks should I avoid? Contact a health care provider if:  You develop symptoms of diabetic ketoacidosis, such as: ? Fatigue. ? Weight loss. ? Excessive thirst. ? Light-headedness. ? Fruity or sweet-smelling breath. ? Excessive urination. ? Vision changes. ? Confusion or irritability. ? Nausea. ? Vomiting. ? Rapid breathing. ? Pain in the abdomen. ? Feeling flushed.  You are unable to drink fluids without vomiting.  You  have any of the following for more than 6 hours: ? Nausea. ? Vomiting. ? Diarrhea.  Your blood glucose is at or above 240 mg/dL (16.1 mmol/L), even after you take an additional insulin dose.  You have a change in how you think, feel, or act (mental status).  You develop another serious illness.  You have been sick or have had a fever for 2 days or longer and you are not getting better. Get help right away if:  Your blood glucose is lower than 54 mg/dL (3.0 mmol/L).  You have difficulty breathing.  You have moderate or high ketone levels in your urine.  You used emergency glucagon to treat low blood glucose. Summary  Blood sugar (glucose) can be difficult to control when you are sick. Common illnesses that can cause problems for people with diabetes (diabetes mellitus) include colds, fever, flu (influenza), nausea, vomiting, and diarrhea.  Illnesses can cause stress and loss of body fluids (dehydration), and those issues can cause blood glucose levels to increase.  Make a plan for days when you are sick (sick day plan) as part of your diabetes management plan. You and your health care provider should make this plan in advance.  It is very important to take your insulin and diabetes medicines and to eat some form of carbohydrate when you are sick.  Contact your health care provider if have problems managing your blood glucose levels when you are sick, or if you have been sick or had a fever for 2 days or longer and are not getting better. This information is not intended to replace advice given to you by your health care provider. Make sure you discuss any questions you have with your health care provider. Document Released: 10/03/2003 Document Revised: 06/28/2016 Document Reviewed: 06/28/2016 Elsevier Interactive Patient Education  [employer].

## 2019-01-12 ENCOUNTER — Ambulatory Visit: Payer: BLUE CROSS/BLUE SHIELD | Admitting: Family Medicine

## 2019-02-01 ENCOUNTER — Encounter: Payer: Self-pay | Admitting: Family Medicine

## 2019-02-01 ENCOUNTER — Other Ambulatory Visit: Payer: Self-pay

## 2019-02-01 ENCOUNTER — Ambulatory Visit (INDEPENDENT_AMBULATORY_CARE_PROVIDER_SITE_OTHER): Payer: PRIVATE HEALTH INSURANCE | Admitting: Family Medicine

## 2019-02-01 VITALS — Temp 98.5°F | Ht 66.0 in | Wt 245.0 lb

## 2019-02-01 DIAGNOSIS — H8110 Benign paroxysmal vertigo, unspecified ear: Secondary | ICD-10-CM | POA: Diagnosis not present

## 2019-02-01 DIAGNOSIS — E785 Hyperlipidemia, unspecified: Secondary | ICD-10-CM | POA: Diagnosis not present

## 2019-02-01 DIAGNOSIS — E1169 Type 2 diabetes mellitus with other specified complication: Secondary | ICD-10-CM | POA: Diagnosis not present

## 2019-02-01 NOTE — Progress Notes (Signed)
Name: Trevor Dennis.   MRN: [id number]    DOB: 03-21-1971   Date:02/01/2019       Progress Note  Subjective  Chief Complaint  Chief Complaint  Patient presents with  . Ear Fullness    Onset-5 days, states his left ear feel stuffy and dizzy feeling when he moves really fast, or looks up or down really fast. Has taken Tylenol for symptom relief but didn't notice a difference.  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  Seen Neurologist in the past but has not had to go because symptoms resolved and would have to another referral.     I connected with  Melida Quitter.  on 02/01/19 at  2:20 PM EDT by a video enabled telemedicine application and verified that I am speaking with the correct person using two identifiers.  I discussed the limitations of evaluation and management by telemedicine and the availability of in person appointments. The patient expressed understanding and agreed to proceed. Staff also discussed with the patient that there may be a patient responsible charge related to this service. Patient Location: at home  Provider Location: [facility]    HPI  DM: he has noticed glucose was very well controlled and he noticed decrease in appetite with Trulicity but it was too expensive when he went to pharmacy. We will check on PA or change medication if needed. Maybe Rybelsus . He states glucose has been a little higher this past couple of weeks since he has not been as active  Vertigo: he noticed left ear fullness, but not nausea or vomiting . Denies nasal congestion, rhinorrhea or fever. He states symptom is brief and trigger by head movement. Worse when tilts to right side, also when he looks up or down. No visual disturbance, not affecting his daily life. No neuro deficit. Denies hearing loss, but had brief episode of tinnitus on right year two days ago that resolved by itself.    Patient Active Problem List   Diagnosis Date Noted  . Chronic neck pain 11/17/2018  . History of lumbar discectomy 03/04/2018  . Fatigue 11/26/2017  . Hypotestosteronism 11/26/2017  . History of subarachnoid hemorrhage 09/17/2017  . Vitamin D insufficiency 08/26/2017  . GERD (gastroesophageal reflux disease) 10/25/2016  . Morbid obesity ([place]) 10/25/2016  . 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 Anxiety 01/01/2016  . Elevated liver enzymes 07/27/2015  . Skin lesion of back 07/27/2015  . Intermittent low back pain 07/27/2015  . Fatty liver 05/03/2015  . Dyslipidemia associated with type 2 diabetes mellitus ([place]) 04/24/2015  . HLD (hyperlipidemia) 04/24/2015  . Fever blister 04/24/2015    Past Surgical History:  Procedure Laterality Date  . BACK SURGERY  8 years ago    Family History  Problem Relation Age of Onset  . Diabetes Mother   . Liver disease Brother   . Alcohol abuse Brother   . ADD / ADHD Son   . ADD / ADHD Son     Social History   Socioeconomic History  . Marital status: Divorced    Spouse name: Not on file  . Number of children: 2  . Years of education: Not on file  . Highest education level: Associate degree: occupational, Hotel manager, or vocational program  Occupational History  . Occupation: owner     Comment: co-owner   Social Needs  . Financial resource strain: Not hard at all  .  Food insecurity:    Worry: Never true    Inability: Never true  . Transportation needs:    Medical: No    Non-medical: No  Tobacco Use  . Smoking status: Former Smoker    Packs/day: 1.00    Years: 30.00    Pack years: 30.00    Types: Cigarettes    Last attempt to quit: 06/04/2016    Years since quitting: 2.6  . Smokeless tobacco: Never Used  . Tobacco comment: social smoker like 1 some days  Substance and Sexual Activity  . Alcohol use: Yes    Alcohol/week: 0.0 standard drinks    Comment: Occasional   . Drug use: No  . Sexual activity: Not Currently    Partners: Female   Lifestyle  . Physical activity:    Days per week: 0 days    Minutes per session: 0 min  . Stress: Only a little  Relationships  . Social connections:    Talks on phone: Not on file    Gets together: Not on file    Attends religious service: Not on file    Active member of club or organization: Not on file    Attends meetings of clubs or organizations: Not on file    Relationship status: Divorced  . Intimate partner violence:    Fear of current or ex partner: No    Emotionally abused: No    Physically abused: No    Forced sexual activity: No  Other Topics Concern  . Not on file  Social History Narrative   Divorced, ex wife used to cheat on him and also was an alcoholic    He has joint custody of his younger son. Her drinking is under better control   Older son is 69 and in the [employer]      Current Outpatient Medications:  .  albuterol (PROVENTIL HFA;VENTOLIN HFA) 108 (90 Base) MCG/ACT inhaler, Inhale 2 puffs into the lungs every 6 (six) hours as needed for wheezing or shortness of breath., Disp: 1 Inhaler, Rfl: 2 .  ALPRAZolam (XANAX) 0.5 MG tablet, Take 1 tablet (0.5 mg total) by mouth daily as needed., Disp: 30 tablet, Rfl: 0 .  atorvastatin (LIPITOR) 10 MG tablet, Take 1 tablet (10 mg total) by mouth daily. Take 1 tablet ( 10 mg total) daily at bedtime., Disp: 90 tablet, Rfl: 1 .  Blood Glucose Monitoring Suppl (ONE TOUCH ULTRA MINI) W/DEVICE KIT, See admin instructions., Disp: , Rfl: 0 .  esomeprazole (NEXIUM) 40 MG capsule, Take 1 capsule (40 mg total) by mouth daily., Disp: 90 capsule, Rfl: 1 .  meloxicam (MOBIC) 15 MG tablet, Take 1 tablet (15 mg total) by mouth daily as needed for pain. Stop diclofenac and try tylenol, Disp: 90 tablet, Rfl: 0 .  metFORMIN (GLUCOPHAGE-XR) 750 MG 24 hr tablet, Take 2 tablets (1,500 mg total) by mouth daily with breakfast., Disp: 180 tablet, Rfl: 0 .  Multiple Vitamin (MULTI VITAMIN DAILY PO), Take by mouth daily. , Disp: , Rfl:  .  ONE  TOUCH ULTRA TEST test strip, USE TO TEST SUGAR 3 TIMES A DAY, Disp: , Rfl: 12 .  valACYclovir (VALTREX) 1000 MG tablet, Take 1 tablet (1,000 mg total) by mouth 2 (two) times daily., Disp: 20 tablet, Rfl: 0 .  benzonatate (TESSALON) 100 MG capsule, Take 1 capsule (100 mg total) by mouth 2 (two) times daily as needed for cough. (Patient not taking: Reported on 02/01/2019), Disp: 20 capsule, Rfl: 0 .  budesonide-formoterol (SYMBICORT)  80-4.5 MCG/ACT inhaler, Inhale 2 puffs into the lungs 2 (two) times daily. (Patient not taking: Reported on 02/01/2019), Disp: 1 Inhaler, Rfl: 3 .  dextromethorphan-guaiFENesin (MUCINEX DM) 30-600 MG 12hr tablet, Take 1 tab every 6 hours. (Patient not taking: Reported on 02/01/2019), Disp: 30 tablet, Rfl: 0 .  Dulaglutide (TRULICITY) 1.5 DQ/2.2WL SOPN, Inject 1.5 mg into the skin once a week. (Patient not taking: Reported on 02/01/2019), Disp: 4 pen, Rfl: 1  No Known Allergies  I personally reviewed active problem list, medication list, allergies, family history with the patient/caregiver today.   ROS  Ten systems reviewed and is negative except as mentioned in HPI   Objective  Virtual encounter, vitals not obtained.  Body mass index is 39.54 kg/m.  Physical Exam  Walking around his house and in no distress Awake and alert   PHQ2/9: Depression screen [facility] 2/9 02/01/2019 11/17/2018 09/09/2018 06/05/2018 11/26/2017  Decreased Interest 0 0 0 2 0  Down, Depressed, Hopeless 0 0 0 1 0  PHQ - 2 Score 0 0 0 3 0  Altered sleeping 0 _0 -  Tired, decreased energy 0 _1 -  Change in appetite 0 0 0 1 -  Feeling bad or failure about yourself  0 0 0 0 -  Trouble concentrating 0 0 0 1 -  Moving slowly or fidgety/restless 0 0 0 0 -  Suicidal thoughts 0 0 0 0 -  PHQ-9 Score 0 _2 -  Difficult doing work/chores Not difficult at all Somewhat difficult Very difficult Somewhat difficult -   PHQ-2/9 Result is negative.    Fall Risk: Fall Risk  02/01/2019 11/24/2018  11/17/2018 09/09/2018 06/05/2018  Falls in the past year? 0 0 0 0 No  Number falls in past yr: 0 0 0 - -  Injury with Fall? 0 0 0 - -     Assessment & Plan  1. Dyslipidemia associated with type 2 diabetes mellitus ([place])  We will try to find out what is covered by his insurance, responded well to trulicity but too expensive  2. Benign paroxysmal vertigo, unspecified laterality  Discussed Epley maneuver, he will try it at home and if no improvement he will call back for referral to ENT  I discussed the assessment and treatment plan with the patient. The patient was provided an opportunity to ask questions and all were answered. The patient agreed with the plan and demonstrated an understanding of the instructions.  The patient was advised to call back or seek an in-person evaluation if the symptoms worsen or if the condition fails to improve as anticipated.  I provided 16  minutes of non-face-to-face time during this encounter.

## 2019-02-01 NOTE — Patient Instructions (Signed)
How to Perform the Epley Maneuver  The Epley maneuver is an exercise that relieves symptoms of vertigo. Vertigo is the feeling that you or your surroundings are moving when they are not. When you feel vertigo, you may feel like the room is spinning and have trouble walking. Dizziness is a little different than vertigo. When you are dizzy, you may feel unsteady or light-headed.  You can do this maneuver at home whenever you have symptoms of vertigo. You can do it up to 3 times a day until your symptoms go away.  Even though the Epley maneuver may relieve your vertigo for a few weeks, it is possible that your symptoms will return. This maneuver relieves vertigo, but it does not relieve dizziness.  What are the risks?  If it is done correctly, the Epley maneuver is considered safe. Sometimes it can lead to dizziness or nausea that goes away after a short time. If you develop other symptoms, such as changes in vision, weakness, or numbness, stop doing the maneuver and call your health care provider.  How to perform the Epley maneuver  1. Sit on the edge of a bed or table with your back straight and your legs extended or hanging over the edge of the bed or table.  2. Turn your head halfway toward the affected ear or side.  3. Lie backward quickly with your head turned until you are lying flat on your back. You may want to position a pillow under your shoulders.  4. Hold this position for 30 seconds. You may experience an attack of vertigo. This is normal.  5. Turn your head to the opposite direction until your unaffected ear is facing the floor.  6. Hold this position for 30 seconds. You may experience an attack of vertigo. This is normal. Hold this position until the vertigo stops.  7. Turn your whole body to the same side as your head. Hold for another 30 seconds.  8. Sit back up.  You can repeat this exercise up to 3 times a day.  Follow these instructions at home:   After doing the Epley maneuver, you can return to  your normal activities.   Ask your health care provider if there is anything you should do at home to prevent vertigo. He or she may recommend that you:  ? Keep your head raised (elevated) with two or more pillows while you sleep.  ? Do not sleep on the side of your affected ear.  ? Get up slowly from bed.  ? Avoid sudden movements during the day.  ? Avoid extreme head movement, like looking up or bending over.  Contact a health care provider if:   Your vertigo gets worse.   You have other symptoms, including:  ? Nausea.  ? Vomiting.  ? Headache.  Get help right away if:   You have vision changes.   You have a severe or worsening headache or neck pain.   You cannot stop vomiting.   You have new numbness or weakness in any part of your body.  Summary   Vertigo is the feeling that you or your surroundings are moving when they are not.   The Epley maneuver is an exercise that relieves symptoms of vertigo.   If the Epley maneuver is done correctly, it is considered safe. You can do it up to 3 times a day.  This information is not intended to replace advice given to you by your health care provider. Make sure   you discuss any questions you have with your health care provider.  Document Released: 10/05/2013 Document Revised: 08/20/2016 Document Reviewed: 08/20/2016  Elsevier Interactive Patient Education  2019 Elsevier Inc.  \

## 2019-02-02 ENCOUNTER — Telehealth: Payer: Self-pay

## 2019-02-02 MED ORDER — GLIPIZIDE ER 2.5 MG PO TB24: 2.5000 mg | ORAL_TABLET | Freq: Every day | ORAL | 0 refills | Status: DC

## 2019-02-02 NOTE — Telephone Encounter (Signed)
Patient states he has a high deducible plan and the Trulicity is too expensive. I called his pharmacy [facility] and the pharmacist states he ran Bydureon, Victoza and Ozempic. Which none of them were approved by his Insurance and would require a PA.

## 2019-02-11 ENCOUNTER — Other Ambulatory Visit: Payer: Self-pay | Admitting: Family Medicine

## 2019-02-11 DIAGNOSIS — M47892 Other spondylosis, cervical region: Secondary | ICD-10-CM

## 2019-02-11 DIAGNOSIS — Z9889 Other specified postprocedural states: Secondary | ICD-10-CM

## 2019-02-11 NOTE — Telephone Encounter (Signed)
Refill request for general medication: Meloxicam 15 mg   Last office visit: 02/01/2019   Follow up visit: 02/15/2019

## 2019-02-15 ENCOUNTER — Ambulatory Visit: Payer: PRIVATE HEALTH INSURANCE | Admitting: Family Medicine

## 2019-02-15 ENCOUNTER — Ambulatory Visit (INDEPENDENT_AMBULATORY_CARE_PROVIDER_SITE_OTHER): Payer: PRIVATE HEALTH INSURANCE | Admitting: Family Medicine

## 2019-02-15 ENCOUNTER — Encounter: Payer: Self-pay | Admitting: Family Medicine

## 2019-02-15 ENCOUNTER — Other Ambulatory Visit: Payer: Self-pay

## 2019-02-15 DIAGNOSIS — R0683 Snoring: Secondary | ICD-10-CM | POA: Diagnosis not present

## 2019-02-15 DIAGNOSIS — Z9889 Other specified postprocedural states: Secondary | ICD-10-CM

## 2019-02-15 DIAGNOSIS — E785 Hyperlipidemia, unspecified: Secondary | ICD-10-CM

## 2019-02-15 DIAGNOSIS — G8929 Other chronic pain: Secondary | ICD-10-CM

## 2019-02-15 DIAGNOSIS — H8110 Benign paroxysmal vertigo, unspecified ear: Secondary | ICD-10-CM

## 2019-02-15 DIAGNOSIS — E1169 Type 2 diabetes mellitus with other specified complication: Secondary | ICD-10-CM | POA: Diagnosis not present

## 2019-02-15 DIAGNOSIS — E118 Type 2 diabetes mellitus with unspecified complications: Secondary | ICD-10-CM | POA: Diagnosis not present

## 2019-02-15 DIAGNOSIS — K760 Fatty (change of) liver, not elsewhere classified: Secondary | ICD-10-CM

## 2019-02-15 DIAGNOSIS — M542 Cervicalgia: Secondary | ICD-10-CM

## 2019-02-15 DIAGNOSIS — E669 Obesity, unspecified: Secondary | ICD-10-CM

## 2019-02-15 DIAGNOSIS — K219 Gastro-esophageal reflux disease without esophagitis: Secondary | ICD-10-CM

## 2019-02-15 LAB — POCT GLYCOSYLATED HEMOGLOBIN (HGB A1C): HbA1c, POC (controlled diabetic range): 8.7 % — AB (ref 0.0–7.0)

## 2019-02-15 MED ORDER — DICLOFENAC SODIUM 75 MG PO TBEC: 75.0000 mg | DELAYED_RELEASE_TABLET | Freq: Two times a day (BID) | ORAL | 0 refills | Status: DC

## 2019-02-15 MED ORDER — DULAGLUTIDE 1.5 MG/0.5ML ~~LOC~~ SOAJ: 1.5000 mg | SUBCUTANEOUS | 2 refills | Status: DC

## 2019-02-15 MED ORDER — METFORMIN HCL ER 750 MG PO TB24: 1500.0000 mg | ORAL_TABLET | Freq: Every day | ORAL | 0 refills | Status: DC

## 2019-02-15 NOTE — Progress Notes (Signed)
Name: Trevor Dennis.   MRN: [id number]    DOB: [birth year]   Date:02/15/2019       Progress Note  Subjective  Chief Complaint  Chief Complaint  Patient presents with  . Medication Refill    Needs  . Diabetes    Checks twice a day, Fasting in the morning 155 and after medication in the afternoon 115. Changed medication due to Trulicity being expensive was switched to Glipizide  . Gastroesophageal Reflux    Has been well controlled   . Snoring    Could not afford Sleep Study  . Chronic Neck/Back Pain    Unchanged  . Hyperlipidemia    I connected with  Melida Quitter.  on 02/15/19 at 10:40 AM EDT by a video enabled telemedicine application and verified that I am speaking with the correct person using two identifiers.  I discussed the limitations of evaluation and management by telemedicine and the availability of in person appointments. The patient expressed understanding and agreed to proceed. Staff also discussed with the patient that there may be a patient responsible charge related to this service. Patient Location: at home Provider Location: [facility]   HPI  Diabetes Type 2 : A1C was controlled 8 months ago at 7.3%, however since than it has been above 8% with the highest level  of 9.1% January 2020 . Today A1C was 8.7%.  He was doing very well on GLP-1 agonist however the cost was prohibitive. He is now on Metformin and Glipizide, glucose at home has been elevated in the 150's  . He denies polyphagia, polydipsia or polyuria. He has obesity, fatty liver, dyslipidemia  associated with DM  Chronic neck/Back pain: feeling more stiff with the cold and humid weather, he still taking nsaid's and flexeril prn to control his symptoms. No radiculitis. Unchanged   Fatty liver; he had [place] in 2018, his last liver enzymes had improved, explained importance of better diabetes control to keep levels down.   Hyperlipidemia: his LDL was reviewed and at goal, LDl of 69   . Doing well. No myalgia.   Morbid Obesity: he states trying to cut down on portion size, he responded to GLP-1 agonist but had stop because of cost. He is eating fast food, he cooks dinner, but breakfast and lunch on the go - he will try to resume protein shakes for breakfast   GERD: he takes PPI daily, discussed long term use and risk associated with it.he is doing well at this time with medication.Unchanged  Snoring: his 64 yo son is complaining of his loud snoring, he also states wakes up 3-4 times per night and feels tired when he wakes up in the morning. Never feels refreshed, his EES score was 10 back in Nov .He was referred to sleep study but could not get it approved, last visit he told us he had a new insurance but deductible is too high, he will try to lose weight.   Patient Active Problem List   Diagnosis Date Noted  . Chronic neck pain 11/17/2018  . History of lumbar discectomy 03/04/2018  . Fatigue 11/26/2017  . Hypotestosteronism 11/26/2017  . History of subarachnoid hemorrhage 09/17/2017  . Vitamin D insufficiency 08/26/2017  . GERD (gastroesophageal reflux disease) 10/25/2016  . Morbid obesity ([place]) 10/25/2016  . 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 Anxiety 01/01/2016  . Elevated liver enzymes 07/27/2015  . Skin lesion of back 07/27/2015  . Intermittent low back pain  07/27/2015  . Fatty liver 05/03/2015  . Dyslipidemia associated with type 2 diabetes mellitus ([place]) 04/24/2015  . HLD (hyperlipidemia) 04/24/2015  . Fever blister 04/24/2015    Past Surgical History:  Procedure Laterality Date  . BACK SURGERY  8 years ago    Family History  Problem Relation Age of Onset  . Diabetes Mother   . Liver disease Brother   . Alcohol abuse Brother   . ADD / ADHD Son   . ADD / ADHD Son     Social History   Socioeconomic History  . Marital status: Divorced    Spouse name: Not on file  . Number of children: 2  . Years of education: Not on  file  . Highest education level: Associate degree: occupational, Hotel manager, or vocational program  Occupational History  . Occupation: Owner    Comment: co-owner   Social Needs  . Financial resource strain: Not hard at all  . Food insecurity:    Worry: Never true    Inability: Never true  . Transportation needs:    Medical: No    Non-medical: No  Tobacco Use  . Smoking status: Former Smoker    Packs/day: 1.00    Years: 26.00    Pack years: 26.00    Types: Cigarettes, Cigars    Start date: 10/14/1989    Last attempt to quit: 06/04/2016    Years since quitting: 2.7  . Smokeless tobacco: Never Used  . Tobacco comment: social smoker like 1 some days  Substance and Sexual Activity  . Alcohol use: Yes    Alcohol/week: 0.0 standard drinks    Comment: Occasional   . Drug use: No  . Sexual activity: Not Currently    Partners: Female  Lifestyle  . Physical activity:    Days per week: 0 days    Minutes per session: 0 min  . Stress: Only a little  Relationships  . Social connections:    Talks on phone: More than three times a week    Gets together: More than three times a week    Attends religious service: More than 4 times per year    Active member of club or organization: Yes    Attends meetings of clubs or organizations: More than 4 times per year    Relationship status: Divorced  . Intimate partner violence:    Fear of current or ex partner: No    Emotionally abused: No    Physically abused: No    Forced sexual activity: No  Other Topics Concern  . Not on file  Social History Narrative   Divorced, ex wife used to cheat on him and also was an alcoholic    He has joint custody of his younger son. Her drinking is under better control   Older son is 100 and in the [employer]      Current Outpatient Medications:  .  ALPRAZolam (XANAX) 0.5 MG tablet, Take 1 tablet (0.5 mg total) by mouth daily as needed., Disp: 30 tablet, Rfl: 0 .  atorvastatin (LIPITOR) 10 MG tablet, Take 1  tablet (10 mg total) by mouth daily. Take 1 tablet ( 10 mg total) daily at bedtime., Disp: 90 tablet, Rfl: 1 .  Blood Glucose Monitoring Suppl (ONE TOUCH ULTRA MINI) W/DEVICE KIT, See admin instructions., Disp: , Rfl: 0 .  esomeprazole (NEXIUM) 40 MG capsule, Take 1 capsule (40 mg total) by mouth daily., Disp: 90 capsule, Rfl: 1 .  glipiZIDE (GLUCOTROL XL) 2.5 MG 24 hr  tablet, Take 1 tablet (2.5 mg total) by mouth daily with breakfast., Disp: 90 tablet, Rfl: 0 .  meloxicam (MOBIC) 15 MG tablet, Take 1 tablet (15 mg total) by mouth daily as needed for pain. Stop diclofenac and try tylenol, Disp: 90 tablet, Rfl: 0 .  metFORMIN (GLUCOPHAGE-XR) 750 MG 24 hr tablet, Take 2 tablets (1,500 mg total) by mouth daily with breakfast., Disp: 180 tablet, Rfl: 0 .  Multiple Vitamin (MULTI VITAMIN DAILY PO), Take by mouth daily. , Disp: , Rfl:  .  ONE TOUCH ULTRA TEST test strip, USE TO TEST SUGAR 3 TIMES A DAY, Disp: , Rfl: 12 .  valACYclovir (VALTREX) 1000 MG tablet, Take 1 tablet (1,000 mg total) by mouth 2 (two) times daily., Disp: 20 tablet, Rfl: 0 .  albuterol (PROVENTIL HFA;VENTOLIN HFA) 108 (90 Base) MCG/ACT inhaler, Inhale 2 puffs into the lungs every 6 (six) hours as needed for wheezing or shortness of breath. (Patient not taking: Reported on 02/15/2019), Disp: 1 Inhaler, Rfl: 2 .  budesonide-formoterol (SYMBICORT) 80-4.5 MCG/ACT inhaler, Inhale 2 puffs into the lungs 2 (two) times daily. (Patient not taking: Reported on 02/01/2019), Disp: 1 Inhaler, Rfl: 3  No Known Allergies  I personally reviewed active problem list, medication list, allergies, family history with the patient/caregiver today.   ROS  Ten systems reviewed and is negative except as mentioned in HPI   Objective  Virtual encounter, vitals not obtained.  There is no height or weight on file to calculate BMI.  Physical Exam  Awake, alert and oriented  Results for orders placed or performed in visit on 02/15/19 (from the past 72  hour(s))  POCT HgB A1C     Status: Abnormal   Collection Time: 02/15/19  9:28 AM  Result Value Ref Range   Hemoglobin A1C     HbA1c POC (<> result, manual entry)     HbA1c, POC (prediabetic range)     HbA1c, POC (controlled diabetic range) 8.7 (A) 0.0 - 7.0 %    PHQ2/9: Depression screen [facility] 2/9 02/15/2019 02/01/2019 11/17/2018 09/09/2018 06/05/2018  Decreased Interest 0 0 0 0 2  Down, Depressed, Hopeless 0 0 0 0 1  PHQ - 2 Score 0 0 0 0 3  Altered sleeping 0 0 _0 Tired, decreased energy 0 0 _1 Change in appetite 0 0 0 0 1  Feeling bad or failure about yourself  0 0 0 0 0  Trouble concentrating 0 0 0 0 1  Moving slowly or fidgety/restless 0 0 0 0 0  Suicidal thoughts 0 0 0 0 0  PHQ-9 Score 0 0 _2 Difficult doing work/chores Not difficult at all Not difficult at all Somewhat difficult Very difficult Somewhat difficult   PHQ-2/9 Result is negative.    Fall Risk: Fall Risk  02/15/2019 02/01/2019 11/24/2018 11/17/2018 09/09/2018  Falls in the past year? 0 0 0 0 0  Number falls in past yr: 0 0 0 0 -  Injury with Fall? 0 0 0 0 -     Assessment & Plan   1. Dyslipidemia associated with type 2 diabetes mellitus ([place])  He wants to try paying for Trulicity, but he did not guaranteed he will get it - POCT HgB A1C - metFORMIN (GLUCOPHAGE-XR) 750 MG 24 hr tablet; Take 2 tablets (1,500 mg total) by mouth daily with breakfast.  Dispense: 180 tablet; Refill: 0 - Dulaglutide (TRULICITY) 1.5 NL/8.9QJ SOPN; Inject 1.5 mg into the skin once  a week.  Dispense: 4 pen; Refill: 2  2. Type 2 diabetes mellitus with complication, without long-term current use of insulin ([facility])  - metFORMIN (GLUCOPHAGE-XR) 750 MG 24 hr tablet; Take 2 tablets (1,500 mg total) by mouth daily with breakfast.  Dispense: 180 tablet; Refill: 0 - Dulaglutide (TRULICITY) 1.5 ZO/1.0RU SOPN; Inject 1.5 mg into the skin once a week.  Dispense: 4 pen; Refill: 2  3. Morbid obesity ([facility])  - Dulaglutide (TRULICITY) 1.5  EA/5.4UJ SOPN; Inject 1.5 mg into the skin once a week.  Dispense: 4 pen; Refill: 2  4. Snoring  He will try to lose   5. Gastroesophageal reflux disease without esophagitis   6. Chronic neck pain  - diclofenac (VOLTAREN) 75 MG EC tablet; Take 1 tablet (75 mg total) by mouth 2 (two) times daily.  Dispense: 60 tablet; Refill: 0  7. Fatty liver   8. History of lumbar discectomy  - diclofenac (VOLTAREN) 75 MG EC tablet; Take 1 tablet (75 mg total) by mouth 2 (two) times daily.  Dispense: 60 tablet; Refill: 0  9. Benign paroxysmal vertigo, unspecified laterality  Doing better   10. Diabetes mellitus type 2 in obese [facility])   I discussed the assessment and treatment plan with the patient. The patient was provided an opportunity to ask questions and all were answered. The patient agreed with the plan and demonstrated an understanding of the instructions.  The patient was advised to call back or seek an in-person evaluation if the symptoms worsen or if the condition fails to improve as anticipated.  I provided 25  minutes of non-face-to-face time during this encounter.

## 2019-02-28 ENCOUNTER — Other Ambulatory Visit: Payer: Self-pay | Admitting: Family Medicine

## 2019-02-28 DIAGNOSIS — E1169 Type 2 diabetes mellitus with other specified complication: Secondary | ICD-10-CM

## 2019-02-28 DIAGNOSIS — E7800 Pure hypercholesterolemia, unspecified: Secondary | ICD-10-CM

## 2019-03-02 ENCOUNTER — Other Ambulatory Visit: Payer: Self-pay | Admitting: Family Medicine

## 2019-03-02 DIAGNOSIS — E7800 Pure hypercholesterolemia, unspecified: Secondary | ICD-10-CM

## 2019-03-02 DIAGNOSIS — E785 Hyperlipidemia, unspecified: Secondary | ICD-10-CM

## 2019-03-02 DIAGNOSIS — E1169 Type 2 diabetes mellitus with other specified complication: Secondary | ICD-10-CM

## 2019-03-02 MED ORDER — LOVASTATIN 40 MG PO TABS: 40.0000 mg | ORAL_TABLET | Freq: Every day | ORAL | 0 refills | Status: DC

## 2019-03-04 ENCOUNTER — Encounter: Payer: Self-pay | Admitting: Family Medicine

## 2019-03-04 ENCOUNTER — Other Ambulatory Visit: Payer: Self-pay

## 2019-03-04 ENCOUNTER — Ambulatory Visit (INDEPENDENT_AMBULATORY_CARE_PROVIDER_SITE_OTHER): Payer: PRIVATE HEALTH INSURANCE | Admitting: Family Medicine

## 2019-03-04 VITALS — Temp 98.2°F

## 2019-03-04 DIAGNOSIS — W57XXXA Bitten or stung by nonvenomous insect and other nonvenomous arthropods, initial encounter: Secondary | ICD-10-CM

## 2019-03-04 DIAGNOSIS — E118 Type 2 diabetes mellitus with unspecified complications: Secondary | ICD-10-CM | POA: Diagnosis not present

## 2019-03-04 MED ORDER — DOXYCYCLINE HYCLATE 100 MG PO TABS: 100.0000 mg | ORAL_TABLET | Freq: Two times a day (BID) | ORAL | 0 refills | Status: AC

## 2019-03-04 NOTE — Progress Notes (Signed)
Name: Trevor Dennis.   MRN: [id number]    DOB: [birth year]   Date:03/04/2019       Progress Note  Subjective  Chief Complaint  Chief Complaint  Patient presents with  . Tick Removal    right waist above hip, redness, with rash running up side of belly hot to touch    I connected with  Melida Quitter.  on 03/04/19 at 10:40 AM EDT by a video enabled telemedicine application and verified that I am speaking with the correct person using two identifiers.  I discussed the limitations of evaluation and management by telemedicine and the availability of in person appointments. The patient expressed understanding and agreed to proceed. Staff also discussed with the patient that there may be a patient responsible charge related to this service. Patient Location: Home Provider Location: Office Additional Individuals present: None  HPI  Pt presents with concern for tick bite.  Played golf over the weekend and had to pull a tick off of his waist line on the right side - likely for about 16 hours and was extremely small. If confident he got the entire tick out, and can see "bite marks".  This morning he noticed a rash tracing up from the area of the bite - states the rash has heat to it. Is having some diarrhea and headache (taking pain medication which is helping. No abnormal body aches,   T2DM - poorly controlled with last A1C 8.7%.  He checks BG's and ranges around 100's-200's.  Discussed dietary changes and sticking to his medication regimen.  Denies polyuria, polydipsia, or polyphagia.  Discussed sick day care in detail.  Patient Active Problem List   Diagnosis Date Noted  . Chronic neck pain 11/17/2018  . History of lumbar discectomy 03/04/2018  . Fatigue 11/26/2017  . Hypotestosteronism 11/26/2017  . History of subarachnoid hemorrhage 09/17/2017  . Vitamin D insufficiency 08/26/2017  . GERD (gastroesophageal reflux disease) 10/25/2016  . Morbid obesity ([place]) 10/25/2016  . 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 Anxiety 01/01/2016  . Elevated liver enzymes 07/27/2015  . Skin lesion of back 07/27/2015  . Intermittent low back pain 07/27/2015  . Fatty liver 05/03/2015  . Dyslipidemia associated with type 2 diabetes mellitus ([place]) 04/24/2015  . HLD (hyperlipidemia) 04/24/2015  . Fever blister 04/24/2015    Social History   Tobacco Use  . Smoking status: Former Smoker    Packs/day: 1.00    Years: 26.00    Pack years: 26.00    Types: Cigarettes, Cigars    Start date: 10/14/1989    Last attempt to quit: 06/04/2016    Years since quitting: 2.7  . Smokeless tobacco: Never Used  . Tobacco comment: social smoker like 1 some days  Substance Use Topics  . Alcohol use: Yes    Alcohol/week: 0.0 standard drinks    Comment: Occasional      Current Outpatient Medications:  .  ALPRAZolam (XANAX) 0.5 MG tablet, Take 1 tablet (0.5 mg total) by mouth daily as needed., Disp: 30 tablet, Rfl: 0 .  diclofenac (VOLTAREN) 75 MG EC tablet, Take 1 tablet (75 mg total) by mouth 2 (two) times daily., Disp: 60 tablet, Rfl: 0 .  esomeprazole (NEXIUM) 40 MG capsule, Take 1 capsule (40 mg total) by mouth daily., Disp: 90 capsule, Rfl: 1 .  glipiZIDE (GLUCOTROL XL) 2.5 MG 24 hr tablet, Take 1 tablet (2.5 mg total) by mouth daily with breakfast., Disp: 90 tablet, Rfl:  0 .  lovastatin (MEVACOR) 40 MG tablet, Take 1 tablet (40 mg total) by mouth at bedtime., Disp: 90 tablet, Rfl: 0 .  metFORMIN (GLUCOPHAGE-XR) 750 MG 24 hr tablet, Take 2 tablets (1,500 mg total) by mouth daily with breakfast., Disp: 180 tablet, Rfl: 0 .  Multiple Vitamin (MULTI VITAMIN DAILY PO), Take by mouth daily. , Disp: , Rfl:  .  valACYclovir (VALTREX) 1000 MG tablet, Take 1 tablet (1,000 mg total) by mouth 2 (two) times daily., Disp: 20 tablet, Rfl: 0 .  Blood Glucose Monitoring Suppl (ONE TOUCH ULTRA MINI) W/DEVICE KIT, See admin instructions., Disp: , Rfl: 0 .  Dulaglutide (TRULICITY) 1.5  RQ/4.1QK SOPN, Inject 1.5 mg into the skin once a week. (Patient not taking: Reported on 03/04/2019), Disp: 4 pen, Rfl: 2 .  ONE TOUCH ULTRA TEST test strip, USE TO TEST SUGAR 3 TIMES A DAY, Disp: , Rfl: 12  No Known Allergies  I personally reviewed active problem list, medication list, allergies, lab results with the patient/caregiver today.  ROS  Ten systems reviewed and is negative except as mentioned in HPI.  Objective  Virtual encounter, vitals not obtained.  There is no height or weight on file to calculate BMI.  Nursing Note and Vital Signs reviewed.  Physical Exam  Constitutional: Patient appears well-developed and well-nourished. No distress.  HENT: Head: Normocephalic and atraumatic.  Neck: Normal range of motion. Pulmonary/Chest: Effort normal. No respiratory distress. Speaking in complete sentences Neurological: Pt is alert and oriented to person, place, and time. Coordination, speech and gait are normal.  Psychiatric: Patient has a normal mood and affect. behavior is normal. Judgment and thought content normal. Skin: Erythematous rasised, welt-like plaques on the right anterior hip - no streaking, no excoriation, no erythema migrans, does not appear consistent with RMSF rash.  No results found for this or any previous visit (from the past 72 hour(s)).  Assessment & Plan  1. Tick bite, initial encounter - Due to report of diarrhea, headache, and confirmed tick bite, along with T2DM that is poorly controlled, we will do the maximum Doxycycline therapy today. - doxycycline (VIBRA-TABS) 100 MG tablet; Take 1 tablet (100 mg total) by mouth 2 (two) times daily for 21 days.  Dispense: 42 tablet; Refill: 0 - Recommend sunscreen when outdoors due to photosensitivity from doxy  2. Type 2 diabetes mellitus with complication, without long-term current use of insulin ([place]) - Discussed sick day care in great detail with the patient - monitor BG's more frequently, eat regular  meals, stay well hydrated, continue taking medications as directed.  -Red flags and when to present for emergency care or RTC including fever >101.87F, chest pain, shortness of breath, new/worsening/un-resolving symptoms, reviewed with patient at time of visit. Follow up and care instructions discussed and provided in AVS. - I discussed the assessment and treatment plan with the patient. The patient was provided an opportunity to ask questions and all were answered. The patient agreed with the plan and demonstrated an understanding of the instructions.  I provided 18 minutes of non-face-to-face time during this encounter.  Hubbard Hartshorn, FNP  Doxy 100 bis x10-21 days

## 2019-03-17 ENCOUNTER — Other Ambulatory Visit: Payer: Self-pay | Admitting: Family Medicine

## 2019-03-17 DIAGNOSIS — K219 Gastro-esophageal reflux disease without esophagitis: Secondary | ICD-10-CM

## 2019-03-17 DIAGNOSIS — W57XXXA Bitten or stung by nonvenomous insect and other nonvenomous arthropods, initial encounter: Secondary | ICD-10-CM

## 2019-03-19 ENCOUNTER — Other Ambulatory Visit: Payer: Self-pay | Admitting: Family Medicine

## 2019-03-19 MED ORDER — GLIPIZIDE ER 5 MG PO TB24: 5.0000 mg | ORAL_TABLET | Freq: Every day | ORAL | 0 refills | Status: DC

## 2019-03-19 NOTE — Telephone Encounter (Signed)
Patient states he is willing to try the Omeprazole due to cost. Also wanted to know if Dr. Carlynn Purl is increasing his glipizide 5 mg since he could not afford the Trulicity. Also does he need to stay on the Metformin since he has stomach upsetness and the glipizide.

## 2019-03-19 NOTE — Telephone Encounter (Signed)
Left a message to ask regarding medication change.

## 2019-04-13 ENCOUNTER — Other Ambulatory Visit: Payer: Self-pay | Admitting: Family Medicine

## 2019-04-13 DIAGNOSIS — G8929 Other chronic pain: Secondary | ICD-10-CM

## 2019-04-13 DIAGNOSIS — M542 Cervicalgia: Secondary | ICD-10-CM

## 2019-04-13 DIAGNOSIS — Z9889 Other specified postprocedural states: Secondary | ICD-10-CM

## 2019-04-15 ENCOUNTER — Other Ambulatory Visit: Payer: Self-pay | Admitting: Family Medicine

## 2019-04-15 DIAGNOSIS — E1169 Type 2 diabetes mellitus with other specified complication: Secondary | ICD-10-CM

## 2019-04-15 DIAGNOSIS — E785 Hyperlipidemia, unspecified: Secondary | ICD-10-CM

## 2019-04-15 DIAGNOSIS — E118 Type 2 diabetes mellitus with unspecified complications: Secondary | ICD-10-CM

## 2019-04-28 ENCOUNTER — Other Ambulatory Visit: Payer: Self-pay | Admitting: Family Medicine

## 2019-05-07 ENCOUNTER — Other Ambulatory Visit: Payer: Self-pay | Admitting: Family Medicine

## 2019-05-11 ENCOUNTER — Other Ambulatory Visit: Payer: Self-pay | Admitting: Family Medicine

## 2019-05-11 NOTE — Telephone Encounter (Signed)
Refill request for general medication. Nexium   Last office visit 02/15/2019   Follow up on 06/01/2019

## 2019-05-17 ENCOUNTER — Other Ambulatory Visit: Payer: Self-pay | Admitting: Family Medicine

## 2019-05-17 MED ORDER — PANTOPRAZOLE SODIUM 40 MG PO TBEC: 40.0000 mg | DELAYED_RELEASE_TABLET | Freq: Every day | ORAL | 0 refills | Status: DC

## 2019-05-26 ENCOUNTER — Other Ambulatory Visit: Payer: Self-pay | Admitting: Family Medicine

## 2019-05-26 DIAGNOSIS — E7800 Pure hypercholesterolemia, unspecified: Secondary | ICD-10-CM

## 2019-05-26 DIAGNOSIS — E785 Hyperlipidemia, unspecified: Secondary | ICD-10-CM

## 2019-05-26 DIAGNOSIS — E1169 Type 2 diabetes mellitus with other specified complication: Secondary | ICD-10-CM

## 2019-06-01 ENCOUNTER — Encounter: Payer: Self-pay | Admitting: Family Medicine

## 2019-06-01 ENCOUNTER — Ambulatory Visit (INDEPENDENT_AMBULATORY_CARE_PROVIDER_SITE_OTHER): Payer: PRIVATE HEALTH INSURANCE | Admitting: Family Medicine

## 2019-06-01 ENCOUNTER — Other Ambulatory Visit: Payer: Self-pay

## 2019-06-01 VITALS — BP 126/72 | HR 92 | Temp 98.2°F | Ht 68.0 in | Wt 242.0 lb

## 2019-06-01 DIAGNOSIS — B001 Herpesviral vesicular dermatitis: Secondary | ICD-10-CM | POA: Diagnosis not present

## 2019-06-01 DIAGNOSIS — E1169 Type 2 diabetes mellitus with other specified complication: Secondary | ICD-10-CM | POA: Diagnosis not present

## 2019-06-01 DIAGNOSIS — G8929 Other chronic pain: Secondary | ICD-10-CM

## 2019-06-01 DIAGNOSIS — K219 Gastro-esophageal reflux disease without esophagitis: Secondary | ICD-10-CM | POA: Diagnosis not present

## 2019-06-01 DIAGNOSIS — E785 Hyperlipidemia, unspecified: Secondary | ICD-10-CM

## 2019-06-01 DIAGNOSIS — M542 Cervicalgia: Secondary | ICD-10-CM

## 2019-06-01 DIAGNOSIS — E669 Obesity, unspecified: Secondary | ICD-10-CM

## 2019-06-01 LAB — POCT GLYCOSYLATED HEMOGLOBIN (HGB A1C): Hemoglobin A1C: 9 % — AB (ref 4.0–5.6)

## 2019-06-01 MED ORDER — VALACYCLOVIR HCL 1 G PO TABS: 1000.0000 mg | ORAL_TABLET | Freq: Two times a day (BID) | ORAL | 0 refills | Status: DC

## 2019-06-01 MED ORDER — METFORMIN HCL ER 750 MG PO TB24: 1500.0000 mg | ORAL_TABLET | Freq: Every day | ORAL | 0 refills | Status: DC

## 2019-06-01 MED ORDER — PIOGLITAZONE HCL 15 MG PO TABS: 15.0000 mg | ORAL_TABLET | Freq: Every day | ORAL | 0 refills | Status: DC

## 2019-06-01 MED ORDER — GLIPIZIDE ER 10 MG PO TB24: 10.0000 mg | ORAL_TABLET | Freq: Every day | ORAL | 0 refills | Status: DC

## 2019-06-01 NOTE — Progress Notes (Signed)
Name: Trevor Dennis.   MRN: [id number]    DOB: 25-Sep-1971   Date:06/01/2019       Progress Note  Subjective  Chief Complaint  Chief Complaint  Patient presents with  . Diabetes  . Anxiety  . Hyperlipidemia  . Fatigue    I connected with  Melida Quitter.  on 06/01/19 at  8:40 AM EDT by a video enabled telemedicine application and verified that I am speaking with the correct person using two identifiers.  I discussed the limitations of evaluation and management by telemedicine and the availability of in person appointments. The patient expressed understanding and agreed to proceed. Staff also discussed with the patient that there may be a patient responsible charge related to this service. Patient Location: at home  Provider Location: [facility]   HPI  Diabetes Type 2 : A1C was controlled 8 months ago at 7.3%, however since than it has been above 8% with the highest level  of 9.1% January 2020, May 2020 it was 8.7% today it is 9%  He was doing very well on GLP-1 agonist however the cost was prohibitive. He is now on Metformin and Glipizide, glucose at home has been 118-150's fasting He denies polyphagia, polydipsia or polyuria. He has obesity, fatty liver, dyslipidemia  associated with DM. We will adjust dose of glipizide to 10 mg in am, add actos 15 mg and continue metformin 1500 mg but he needs to follow a healthier diet  Chronic neck/Back pain: feeling more stiff with the cold and humid weather, he still taking nsaid's and flexeril prn to control his symptoms. He is doing well at this time, pain is intermittent   Fatty liver; he had [place] in 2018, his last liver enzymes had improved, explained importance of better diabetes control to keep levels down. Discussed rechecking labs on his next visit  Hyperlipidemia: his LDL was reviewed and at goal, LDl of 69  . Doing well. No myalgia.   Morbid Obesity: he states trying to cut down on portion size, he responded  to GLP-1 agonist but had stop because of cost. He is cutting down on portions size, he still needs to eat fast food but making better choices   GERD: he takes PPI daily, discussed long term use and risk associated with it.he is doing well at this time with medication.Unable to stop medication  Snoring: his10yo son is complaining of his loud snoring, he also states wakes up 3-4 times per night and feels tired when he wakes up in the morning. Never feels refreshed, his EES score was 10back in Nov.He was referred to sleep study but could not get it approved,he states snoring is not as bad lately, losing weight.   Leucocytosis : found on his last lab work 10/2018, advised him to return for recheck .   Patient Active Problem List   Diagnosis Date Noted  . Chronic neck pain 11/17/2018  . History of lumbar discectomy 03/04/2018  . Fatigue 11/26/2017  . Hypotestosteronism 11/26/2017  . History of subarachnoid hemorrhage 09/17/2017  . Vitamin D insufficiency 08/26/2017  . GERD (gastroesophageal reflux disease) 10/25/2016  . Morbid obesity ([place]) 10/25/2016  . 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 Anxiety 01/01/2016  . Elevated liver enzymes 07/27/2015  . Skin lesion of back 07/27/2015  . Intermittent low back pain 07/27/2015  . Fatty liver 05/03/2015  . Dyslipidemia associated with type 2 diabetes mellitus ([place]) 04/24/2015  . HLD (hyperlipidemia) 04/24/2015  .  Fever blister 04/24/2015    Past Surgical History:  Procedure Laterality Date  . BACK SURGERY  8 years ago    Family History  Problem Relation Age of Onset  . Diabetes Mother   . Liver disease Brother   . Alcohol abuse Brother   . ADD / ADHD Son   . ADD / ADHD Son     Social History   Socioeconomic History  . Marital status: Divorced    Spouse name: Not on file  . Number of children: 2  . Years of education: Not on file  . Highest education level: Associate degree: occupational, Hotel manager,  or vocational program  Occupational History  . Occupation: Owner    Comment: co-owner   Social Needs  . Financial resource strain: Not hard at all  . Food insecurity    Worry: Never true    Inability: Never true  . Transportation needs    Medical: No    Non-medical: No  Tobacco Use  . Smoking status: Former Smoker    Packs/day: 1.00    Years: 26.00    Pack years: 26.00    Types: Cigarettes, Cigars    Start date: 10/14/1989    Quit date: 06/04/2016    Years since quitting: 2.9  . Smokeless tobacco: Never Used  . Tobacco comment: social smoker like 1 some days  Substance and Sexual Activity  . Alcohol use: Yes    Alcohol/week: 0.0 standard drinks    Comment: Occasional   . Drug use: No  . Sexual activity: Not Currently    Partners: Female  Lifestyle  . Physical activity    Days per week: 0 days    Minutes per session: 0 min  . Stress: Only a little  Relationships  . Social connections    Talks on phone: More than three times a week    Gets together: More than three times a week    Attends religious service: More than 4 times per year    Active member of club or organization: Yes    Attends meetings of clubs or organizations: More than 4 times per year    Relationship status: Divorced  . Intimate partner violence    Fear of current or ex partner: No    Emotionally abused: No    Physically abused: No    Forced sexual activity: No  Other Topics Concern  . Not on file  Social History Narrative   Divorced, ex wife used to cheat on him and also was an alcoholic    He has joint custody of his younger son. Her drinking is under better control   Older son is 70 and in the [employer]      Current Outpatient Medications:  .  ALPRAZolam (XANAX) 0.5 MG tablet, Take 1 tablet (0.5 mg total) by mouth daily as needed., Disp: 30 tablet, Rfl: 0 .  Blood Glucose Monitoring Suppl (ONE TOUCH ULTRA MINI) W/DEVICE KIT, See admin instructions., Disp: , Rfl: 0 .  diclofenac (VOLTAREN) 75  MG EC tablet, TAKE 1 TABLET BY MOUTH TWICE A DAY, Disp: 60 tablet, Rfl: 0 .  glipiZIDE (GLUCOTROL XL) 5 MG 24 hr tablet, Take 1 tablet (5 mg total) by mouth daily with breakfast., Disp: 90 tablet, Rfl: 0 .  lovastatin (MEVACOR) 40 MG tablet, TAKE 1 TABLET BY MOUTH EVERYDAY AT BEDTIME, Disp: 90 tablet, Rfl: 0 .  metFORMIN (GLUCOPHAGE-XR) 750 MG 24 hr tablet, TAKE 2 TABLETS (1,500 MG TOTAL) BY MOUTH DAILY WITH BREAKFAST.,  Disp: , Rfl:  .  Multiple Vitamin (MULTI VITAMIN DAILY PO), Take by mouth daily. , Disp: , Rfl:  .  omeprazole (PRILOSEC) 40 MG capsule, Please specify directions, refills and quantity, Disp: 90 capsule, Rfl: 0 .  ONE TOUCH ULTRA TEST test strip, USE TO TEST SUGAR 3 TIMES A DAY, Disp: , Rfl: 12 .  valACYclovir (VALTREX) 1000 MG tablet, Take 1 tablet (1,000 mg total) by mouth 2 (two) times daily., Disp: 20 tablet, Rfl: 0 .  pantoprazole (PROTONIX) 40 MG tablet, Take 1 tablet (40 mg total) by mouth daily. (Patient not taking: Reported on 06/01/2019), Disp: 90 tablet, Rfl: 0  No Known Allergies  I personally reviewed active problem list, medication list, allergies, family history, social history with the patient/caregiver today.   ROS  Ten systems reviewed and is negative except as mentioned in HPI   Objective Today's Vitals   06/01/19 0802  BP: 126/72  Pulse: 92  Temp: 98.2 F (36.8 C)  TempSrc: Temporal  SpO2: 99%  Weight: 242 lb (109.8 kg)  Height: _0  (1.727 m)   Body mass index is 36.8 kg/m.  Physical Exam  Awake, alert and oriented   PHQ2/9: Depression screen [facility] 2/9 06/01/2019 03/04/2019 02/15/2019 02/01/2019 11/17/2018  Decreased Interest 0 0 0 0 0  Down, Depressed, Hopeless 0 0 0 0 0  PHQ - 2 Score 0 0 0 0 0  Altered sleeping 0 0 0 0 3  Tired, decreased energy 0 0 0 0 3  Change in appetite 0 0 0 0 0  Feeling bad or failure about yourself  0 0 0 0 0  Trouble concentrating 0 0 0 0 0  Moving slowly or fidgety/restless 0 0 0 0 0  Suicidal thoughts 0 0 0 0  0  PHQ-9 Score 0 0 0 0 6  Difficult doing work/chores - Not difficult at all Not difficult at all Not difficult at all Somewhat difficult  Some recent data might be hidden   PHQ-2/9 Result is negative.    Fall Risk: Fall Risk  06/01/2019 03/04/2019 02/15/2019 02/01/2019 11/24/2018  Falls in the past year? 0 0 0 0 0  Number falls in past yr: 0 0 0 0 0  Injury with Fall? 0 0 0 0 0     Assessment & Plan   1. Gastroesophageal reflux disease without esophagitis  Controlled   2. Fever blister  - valACYclovir (VALTREX) 1000 MG tablet; Take 1 tablet (1,000 mg total) by mouth 2 (two) times daily.  Dispense: 20 tablet; Refill: 0  3. Dyslipidemia associated with type 2 diabetes mellitus (H[facility])  - glipiZIDE (GLUCOTROL XL) 10 MG 24 hr tablet; Take 1 tablet (10 mg total) by mouth daily with breakfast.  Dispense: 90 tablet; Refill: 0 - POCT glycosylated hemoglobin (Hb A1C) - metFORMIN (GLUCOPHAGE-XR) 750 MG 24 hr tablet; Take 2 tablets (1,500 mg total) by mouth daily with breakfast.  Dispense: 180 tablet; Refill: 0 - pioglitazone (ACTOS) 15 MG tablet; Take 1 tablet (15 mg total) by mouth daily.  Dispense: 90 tablet; Refill: 0  4. Diabetes mellitus type 2 in obese (HCC)  - metFORMIN (GLUCOPHAGE-XR) 750 MG 24 hr tablet; Take 2 tablets (1,500 mg total) by mouth daily with breakfast.  Dispense: 180 tablet; Refill: 0 - pioglitazone (ACTOS) 15 MG tablet; Take 1 tablet (15 mg total) by mouth daily.  Dispense: 90 tablet; Refill: 0  5. Chronic neck pain  Stable on prn medication    I discussed the assessment  and treatment plan with the patient. The patient was provided an opportunity to ask questions and all were answered. The patient agreed with the plan and demonstrated an understanding of the instructions.  The patient was advised to call back or seek an in-person evaluation if the symptoms worsen or if the condition fails to improve as anticipated.  I provided 25  minutes of non-face-to-face time  during this encounter.

## 2019-06-13 ENCOUNTER — Other Ambulatory Visit: Payer: Self-pay | Admitting: Family Medicine

## 2019-06-14 ENCOUNTER — Other Ambulatory Visit: Payer: Self-pay

## 2019-06-14 MED ORDER — ESOMEPRAZOLE MAGNESIUM 40 MG PO CPDR: 40.0000 mg | DELAYED_RELEASE_CAPSULE | Freq: Every day | ORAL | 0 refills | Status: DC

## 2019-06-14 NOTE — Telephone Encounter (Signed)
Patient is requesting a refill on Nexium 40 mg with a 90 day supply to [facility]

## 2019-07-19 LAB — BASIC METABOLIC PANEL: Glucose: 209

## 2019-07-19 LAB — HEMOGLOBIN A1C: Hemoglobin A1C: 8.9

## 2019-08-10 ENCOUNTER — Encounter: Payer: Self-pay | Admitting: Family Medicine

## 2019-08-11 ENCOUNTER — Encounter: Payer: Self-pay | Admitting: Family Medicine

## 2019-08-23 ENCOUNTER — Other Ambulatory Visit: Payer: Self-pay | Admitting: Family Medicine

## 2019-08-23 DIAGNOSIS — E7800 Pure hypercholesterolemia, unspecified: Secondary | ICD-10-CM

## 2019-08-23 DIAGNOSIS — E785 Hyperlipidemia, unspecified: Secondary | ICD-10-CM

## 2019-08-23 DIAGNOSIS — E1169 Type 2 diabetes mellitus with other specified complication: Secondary | ICD-10-CM

## 2019-08-27 ENCOUNTER — Other Ambulatory Visit: Payer: Self-pay | Admitting: Family Medicine

## 2019-08-27 DIAGNOSIS — E1169 Type 2 diabetes mellitus with other specified complication: Secondary | ICD-10-CM

## 2019-09-23 ENCOUNTER — Other Ambulatory Visit: Payer: Self-pay | Admitting: Family Medicine

## 2019-09-23 DIAGNOSIS — E7800 Pure hypercholesterolemia, unspecified: Secondary | ICD-10-CM

## 2019-09-23 DIAGNOSIS — E785 Hyperlipidemia, unspecified: Secondary | ICD-10-CM

## 2019-09-23 DIAGNOSIS — E1169 Type 2 diabetes mellitus with other specified complication: Secondary | ICD-10-CM

## 2019-09-23 NOTE — Telephone Encounter (Signed)
lovastatin (MEVACOR) 40 MG tablet  glipiZIDE (GLUCOTROL XL) 10 MG 24 hr tablet  pioglitazone (ACTOS) 15 MG tablet     Patient is requesting refill.    Pharmacy:  [id number] - [place], [place] - [address] Phone:  [phone]  Fax:  [phone]

## 2019-09-24 MED ORDER — LOVASTATIN 40 MG PO TABS: ORAL_TABLET | ORAL | 0 refills | Status: DC

## 2019-09-24 MED ORDER — GLIPIZIDE ER 10 MG PO TB24: 10.0000 mg | ORAL_TABLET | Freq: Every day | ORAL | 0 refills | Status: DC

## 2019-09-24 MED ORDER — PIOGLITAZONE HCL 15 MG PO TABS: 15.0000 mg | ORAL_TABLET | Freq: Every day | ORAL | 0 refills | Status: DC

## 2019-10-04 ENCOUNTER — Encounter: Payer: Self-pay | Admitting: Family Medicine

## 2019-10-04 ENCOUNTER — Other Ambulatory Visit: Payer: Self-pay | Admitting: Family Medicine

## 2019-10-04 ENCOUNTER — Ambulatory Visit (INDEPENDENT_AMBULATORY_CARE_PROVIDER_SITE_OTHER): Payer: PRIVATE HEALTH INSURANCE | Admitting: Family Medicine

## 2019-10-04 ENCOUNTER — Other Ambulatory Visit: Payer: Self-pay

## 2019-10-04 DIAGNOSIS — E559 Vitamin D deficiency, unspecified: Secondary | ICD-10-CM | POA: Diagnosis not present

## 2019-10-04 DIAGNOSIS — K219 Gastro-esophageal reflux disease without esophagitis: Secondary | ICD-10-CM

## 2019-10-04 DIAGNOSIS — E669 Obesity, unspecified: Secondary | ICD-10-CM

## 2019-10-04 DIAGNOSIS — E785 Hyperlipidemia, unspecified: Secondary | ICD-10-CM

## 2019-10-04 DIAGNOSIS — E1169 Type 2 diabetes mellitus with other specified complication: Secondary | ICD-10-CM | POA: Diagnosis not present

## 2019-10-04 DIAGNOSIS — Z114 Encounter for screening for human immunodeficiency virus [HIV]: Secondary | ICD-10-CM

## 2019-10-04 DIAGNOSIS — K760 Fatty (change of) liver, not elsewhere classified: Secondary | ICD-10-CM

## 2019-10-04 DIAGNOSIS — M542 Cervicalgia: Secondary | ICD-10-CM | POA: Diagnosis not present

## 2019-10-04 DIAGNOSIS — G8929 Other chronic pain: Secondary | ICD-10-CM

## 2019-10-04 DIAGNOSIS — D72829 Elevated white blood cell count, unspecified: Secondary | ICD-10-CM

## 2019-10-04 MED ORDER — METFORMIN HCL ER 750 MG PO TB24: 1500.0000 mg | ORAL_TABLET | Freq: Every day | ORAL | 0 refills | Status: DC

## 2019-10-04 MED ORDER — ESOMEPRAZOLE MAGNESIUM 20 MG PO CPDR: 20.0000 mg | DELAYED_RELEASE_CAPSULE | Freq: Every day | ORAL | 0 refills | Status: DC

## 2019-10-04 NOTE — Progress Notes (Signed)
Name: Trevor Dennis.   MRN: [id number]    DOB: 1971-09-05   Date:10/04/2019       Progress Note  Subjective  Chief Complaint  Chief Complaint  Patient presents with  . Diabetes    He continues to check his bs every now and again. Thinks bs has been better since chance in medication.  Marland Kitchen Anxiety    Remains controlled with medications.  . Hyperlipidemia  . Fatigue    Still feels tired, but all comes from home schooling his son and working from home    I connected with  Melida Quitter.  on 10/04/19 at  8:40 AM EST by a video enabled telemedicine application and verified that I am speaking with the correct person using two identifiers.  I discussed the limitations of evaluation and management by telemedicine and the availability of in person appointments. The patient expressed understanding and agreed to proceed. Staff also discussed with the patient that there may be a patient responsible charge related to this service. Patient Location: at home  Provider Location: [facility]   HPI  Diabetes Type 2 :A1C  January 2020 was 8% , May 2020 it was 8.7% 9 % August, 8.23 July 2019 ,  He was doing very well on GLP-1 agonist however the cost was prohibitive. On his last visit we continued  Metformin 1500 mg, increased dose of Glipizide to 10 mg and added Actos 15 mg. His home glucose has been 128-148's over the past month  fasting He denies polyphagia, polydipsia or polyuria. He has obesity, fatty liver, dyslipidemia associated with DM. The labs done in 07/2019 was done through his insurance  Chronic neck/Back pain: feeling more stiff with the cold and humid weather, he still taking nsaid's - reviewed importance of not taking multiple medications on the same day. Taking medication   Fatty liver; he had [place] in 2018, his last liver enzymes had improved, explained importance of better diabetes control to keep levels down.He is due for repeat liver panel    Hyperlipidemia: his LDL was reviewed and at goal, LDl of 69January 2020. Doing well. No myalgia. Advised to have lipid panel rechecked    Morbid Obesity: he states trying to cut down on portion size, he responded toGLP-1 agonist but had stop because of cost. He is cutting down on portions size, he still needs to eat fast food but making better choices He gained 6 lbs since last visit, he states not as active lately   GERD: he takes PPI daily, discussed long term use and risk associated with it.he is doing well at this time with otc nexium now and seems to control it most of the time.   Snoring: his10yo son states snoring not as bad lately. His ESS was 10 back in Nov 2019 , he states still wakes up feeling tired sometimes, he is not interested in doing a sleep study   Leucocytosis : found on his last lab work 10/2018, he will return for labs today    Patient Active Problem List   Diagnosis Date Noted  . Chronic neck pain 11/17/2018  . History of lumbar discectomy 03/04/2018  . Fatigue 11/26/2017  . Hypotestosteronism 11/26/2017  . History of subarachnoid hemorrhage 09/17/2017  . Vitamin D insufficiency 08/26/2017  . GERD (gastroesophageal reflux disease) 10/25/2016  . Morbid obesity ([place]) 10/25/2016  . 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 Anxiety 01/01/2016  . Elevated liver enzymes 07/27/2015  . Skin  lesion of back 07/27/2015  . Intermittent low back pain 07/27/2015  . Fatty liver 05/03/2015  . Dyslipidemia associated with type 2 diabetes mellitus ([place]) 04/24/2015  . HLD (hyperlipidemia) 04/24/2015  . Fever blister 04/24/2015    Past Surgical History:  Procedure Laterality Date  . BACK SURGERY  8 years ago    Family History  Problem Relation Age of Onset  . Diabetes Mother   . Liver disease Brother   . Alcohol abuse Brother   . ADD / ADHD Son   . ADD / ADHD Son     Social History   Socioeconomic History  . Marital status: Divorced     Spouse name: Not on file  . Number of children: 2  . Years of education: Not on file  . Highest education level: Associate degree: occupational, Hotel manager, or vocational program  Occupational History  . Occupation: Owner    Comment: co-owner   Tobacco Use  . Smoking status: Former Smoker    Packs/day: 1.00    Years: 26.00    Pack years: 26.00    Types: Cigarettes, Cigars    Start date: 10/14/1989    Quit date: 06/04/2016    Years since quitting: 3.3  . Smokeless tobacco: Never Used  . Tobacco comment: he is smoking again since 07/2019 2-3 cigarettes daily but will quit again   Substance and Sexual Activity  . Alcohol use: Yes    Alcohol/week: 0.0 standard drinks    Comment: Occasional   . Drug use: No  . Sexual activity: Not Currently    Partners: Female  Other Topics Concern  . Not on file  Social History Narrative   Divorced, ex wife used to cheat on him and also was an alcoholic    He has joint custody of his younger son. Her drinking is under better control   Older son is 45 and in the [employer]    Social Determinants of Health   Financial Resource Strain: Medium Risk  . Difficulty of Paying Living Expenses: Somewhat hard  Food Insecurity: No Food Insecurity  . Worried About Charity fundraiser in the Last Year: Never true  . Ran Out of Food in the Last Year: Never true  Transportation Needs: No Transportation Needs  . Lack of Transportation (Medical): No  . Lack of Transportation (Non-Medical): No  Physical Activity: Inactive  . Days of Exercise per Week: 0 days  . Minutes of Exercise per Session: 0 min  Stress: No Stress Concern Present  . Feeling of Stress : Not at all  Social Connections: Slightly Isolated  . Frequency of Communication with Friends and Family: More than three times a week  . Frequency of Social Gatherings with Friends and Family: More than three times a week  . Attends Religious Services: More than 4 times per year  . Active Member of Clubs  or Organizations: No  . Attends Archivist Meetings: Never  . Marital Status: Married  Human resources officer Violence: Not At Risk  . Fear of Current or Ex-Partner: No  . Emotionally Abused: No  . Physically Abused: No  . Sexually Abused: No     Current Outpatient Medications:  .  ALPRAZolam (XANAX) 0.5 MG tablet, Take 1 tablet (0.5 mg total) by mouth daily as needed., Disp: 30 tablet, Rfl: 0 .  Blood Glucose Monitoring Suppl (ONE TOUCH ULTRA MINI) W/DEVICE KIT, See admin instructions., Disp: , Rfl: 0 .  diclofenac (VOLTAREN) 75 MG EC tablet, TAKE 1 TABLET  BY MOUTH TWICE A DAY, Disp: 60 tablet, Rfl: 0 .  esomeprazole (NEXIUM) 20 MG capsule, Take 1 capsule (20 mg total) by mouth daily., Disp: 90 capsule, Rfl: 0 .  glipiZIDE (GLUCOTROL XL) 10 MG 24 hr tablet, Take 1 tablet (10 mg total) by mouth daily with breakfast., Disp: 90 tablet, Rfl: 0 .  lovastatin (MEVACOR) 40 MG tablet, TAKE 1 TABLET BY MOUTH EVERYDAY AT BEDTIME, Disp: 90 tablet, Rfl: 0 .  metFORMIN (GLUCOPHAGE-XR) 750 MG 24 hr tablet, Take 2 tablets (1,500 mg total) by mouth daily with breakfast., Disp: 180 tablet, Rfl: 0 .  Multiple Vitamin (MULTI VITAMIN DAILY PO), Take by mouth daily. , Disp: , Rfl:  .  ONE TOUCH ULTRA TEST test strip, USE TO TEST SUGAR 3 TIMES A DAY, Disp: , Rfl: 12 .  pioglitazone (ACTOS) 15 MG tablet, Take 1 tablet (15 mg total) by mouth daily., Disp: 90 tablet, Rfl: 0 .  valACYclovir (VALTREX) 1000 MG tablet, Take 1 tablet (1,000 mg total) by mouth 2 (two) times daily., Disp: 20 tablet, Rfl: 0  No Known Allergies  I personally reviewed active problem list, medication list, allergies, family history, social history, health maintenance with the patient/caregiver today.   ROS  Ten systems reviewed and is negative except as mentioned in HPI   Objective  Virtual encounter, vitals not obtained.  There is no height or weight on file to calculate BMI.  Physical Exam  Awake, alert and  oriented  PHQ2/9: Depression screen [facility] 2/9 10/04/2019 06/01/2019 03/04/2019 02/15/2019 02/01/2019  Decreased Interest 0 0 0 0 0  Down, Depressed, Hopeless 0 0 0 0 0  PHQ - 2 Score 0 0 0 0 0  Altered sleeping 0 0 0 0 0  Tired, decreased energy 0 0 0 0 0  Change in appetite 0 0 0 0 0  Feeling bad or failure about yourself  0 0 0 0 0  Trouble concentrating 0 0 0 0 0  Moving slowly or fidgety/restless 0 0 0 0 0  Suicidal thoughts 0 0 0 0 0  PHQ-9 Score 0 0 0 0 0  Difficult doing work/chores - - Not difficult at all Not difficult at all Not difficult at all  Some recent data might be hidden   PHQ-2/9 Result is negative.    Fall Risk: Fall Risk  10/04/2019 06/01/2019 03/04/2019 02/15/2019 02/01/2019  Falls in the past year? 0 0 0 0 0  Number falls in past yr: 0 0 0 0 0  Injury with Fall? 0 0 0 0 0    Assessment & Plan  1. Diabetes mellitus type 2 in obese (HCC)  - COMPLETE METABOLIC PANEL WITH GFR - Hemoglobin A1c - Microalbumin / creatinine urine ratio - metFORMIN (GLUCOPHAGE-XR) 750 MG 24 hr tablet; Take 2 tablets (1,500 mg total) by mouth daily with breakfast.  Dispense: 180 tablet; Refill: 0  2. Morbid obesity ([place])  Discussed with the patient the risk posed by an increased BMI. Discussed importance of portion control, calorie counting and at least 150 minutes of physical activity weekly. Avoid sweet beverages and drink more water. Eat at least 6 servings of fruit and vegetables daily   3. Dyslipidemia associated with type 2 diabetes mellitus (HCC)  - Lipid panel - metFORMIN (GLUCOPHAGE-XR) 750 MG 24 hr tablet; Take 2 tablets (1,500 mg total) by mouth daily with breakfast.  Dispense: 180 tablet; Refill: 0  4. Chronic neck pain  stable  5. Vitamin D insufficiency  He is  taking supplements  6. Fatty liver  - COMPLETE METABOLIC PANEL WITH GFR  7. Encounter for screening for HIV  - HIV Antibody (routine testing w rflx)  8. Leukocytosis, unspecified type  - CBC with  Differential/Platelet  9. Gastroesophageal reflux disease without esophagitis  - esomeprazole (NEXIUM) 20 MG capsule; Take 1 capsule (20 mg total) by mouth daily.  Dispense: 90 capsule; Refill: 0  I discussed the assessment and treatment plan with the patient. The patient was provided an opportunity to ask questions and all were answered. The patient agreed with the plan and demonstrated an understanding of the instructions.  The patient was advised to call back or seek an in-person evaluation if the symptoms worsen or if the condition fails to improve as anticipated.  I provided 25  minutes of non-face-to-face time during this encounter.

## 2019-10-04 NOTE — Telephone Encounter (Signed)
   Notes to clinic:   Alternative Requested:DRUG NOT COVERED BY INSURANCE. WOULD YOU LIKE TO CHANGE TO ANOTHER MED? THANKS  Requested Prescriptions  Pending Prescriptions Disp Refills   RABEprazole (ACIPHEX) 20 MG tablet [Pharmacy Med Name: RABEPRAZOLE SOD DR 20 MG TAB]  0    Sig: Please specify directions, refills and quantity      Gastroenterology: Proton Pump Inhibitors Passed - 10/04/2019  8:44 AM      Passed - Valid encounter within last 12 months    Recent Outpatient Visits           Today Diabetes mellitus type 2 in obese [facility])   [facility] Steele Sizer, MD   4 months ago Dyslipidemia associated with type 2 diabetes mellitus [facility]   [facility] Steele Sizer, MD   7 months ago Tick bite, initial encounter   [place], FNP   7 months ago Dyslipidemia associated with type 2 diabetes mellitus [facility])   [facility] Steele Sizer, MD   8 months ago Dyslipidemia associated with type 2 diabetes mellitus [facility])   [facility] Steele Sizer, MD       Future Appointments             In 3 months Ancil Boozer, Drue Stager, MD [facility], [facility]

## 2019-10-06 LAB — COMPLETE METABOLIC PANEL WITH GFR
AG Ratio: 1.7 (calc) (ref 1.0–2.5)
ALT: 78 U/L — ABNORMAL HIGH (ref 9–46)
AST: 75 U/L — ABNORMAL HIGH (ref 10–40)
Albumin: 4.5 g/dL (ref 3.6–5.1)
Alkaline phosphatase (APISO): 43 U/L (ref 36–130)
BUN: 19 mg/dL (ref 7–25)
CO2: 24 mmol/L (ref 20–32)
Calcium: 9.4 mg/dL (ref 8.6–10.3)
Chloride: 104 mmol/L (ref 98–110)
Creat: 0.87 mg/dL (ref 0.60–1.35)
GFR, Est African American: 120 mL/min/{1.73_m2} (ref >=60)
GFR, Est Non African American: 103 mL/min/{1.73_m2} (ref >=60)
Globulin: 2.7 g/dL (calc) (ref 1.9–3.7)
Glucose, Bld: 169 mg/dL — ABNORMAL HIGH (ref 65–99)
Potassium: 4.6 mmol/L (ref 3.5–5.3)
Sodium: 139 mmol/L (ref 135–146)
Total Bilirubin: 0.7 mg/dL (ref 0.2–1.2)
Total Protein: 7.2 g/dL (ref 6.1–8.1)

## 2019-10-06 LAB — HEMOGLOBIN A1C
Hgb A1c MFr Bld: 8.7 % of total Hgb — ABNORMAL HIGH (ref <5.7)
Mean Plasma Glucose: 203 (calc)
eAG (mmol/L): 11.2 (calc)

## 2019-10-06 LAB — CBC WITH DIFFERENTIAL/PLATELET
Absolute Monocytes: 508 cells/uL (ref 200–950)
Basophils Absolute: 92 cells/uL (ref 0–200)
Basophils Relative: 1.2 %
Eosinophils Absolute: 246 cells/uL (ref 15–500)
Eosinophils Relative: 3.2 %
HCT: 40.9 % (ref 38.5–50.0)
Hemoglobin: 13.7 g/dL (ref 13.2–17.1)
Lymphs Abs: 2556 cells/uL (ref 850–3900)
MCH: 30.8 pg (ref 27.0–33.0)
MCHC: 33.5 g/dL (ref 32.0–36.0)
MCV: 91.9 fL (ref 80.0–100.0)
MPV: 12.4 fL (ref 7.5–12.5)
Monocytes Relative: 6.6 %
Neutro Abs: 4297 cells/uL (ref 1500–7800)
Neutrophils Relative %: 55.8 %
Platelets: 221 10*3/uL (ref 140–400)
RBC: 4.45 10*6/uL (ref 4.20–5.80)
RDW: 12.6 % (ref 11.0–15.0)
Total Lymphocyte: 33.2 %
WBC: 7.7 10*3/uL (ref 3.8–10.8)

## 2019-10-06 LAB — LIPID PANEL
Cholesterol: 144 mg/dL (ref <200)
HDL: 56 mg/dL (ref >=40)
LDL Cholesterol (Calc): 69 mg/dL (calc)
Non-HDL Cholesterol (Calc): 88 mg/dL (calc) (ref <130)
Total CHOL/HDL Ratio: 2.6 (calc) (ref <5.0)
Triglycerides: 102 mg/dL (ref <150)

## 2019-10-06 LAB — HIV ANTIBODY (ROUTINE TESTING W REFLEX): HIV 1&2 Ab, 4th Generation: NONREACTIVE

## 2019-10-06 LAB — MICROALBUMIN / CREATININE URINE RATIO
Creatinine, Urine: 189 mg/dL (ref 20–320)
Microalb Creat Ratio: 31 mcg/mg creat — ABNORMAL HIGH (ref <30)
Microalb, Ur: 5.9 mg/dL

## 2019-10-11 NOTE — Telephone Encounter (Unsigned)
Copied from [place] [id number]. Topic: Quick Communication - Lab Results (Clinic Use ONLY) >> Oct 11, 2019  2:26 PM Lennox Solders wrote: Pt is calling back and would like blood work results

## 2019-12-22 ENCOUNTER — Other Ambulatory Visit: Payer: Self-pay | Admitting: Family Medicine

## 2019-12-22 DIAGNOSIS — K219 Gastro-esophageal reflux disease without esophagitis: Secondary | ICD-10-CM

## 2019-12-22 NOTE — Telephone Encounter (Signed)
Requested Prescriptions  Pending Prescriptions Disp Refills  . NEXIUM 24HR 20 MG capsule [Pharmacy Med Name: NEXIUM 24HR 20 MG CAPSULE] 90 capsule 3    Sig: TAKE 1 CAPSULE BY MOUTH EVERY DAY     Gastroenterology: Proton Pump Inhibitors Passed - 12/22/2019  1:03 AM      Passed - Valid encounter within last 12 months    Recent Outpatient Visits          2 months ago Diabetes mellitus type 2 in obese [facility])   [facility], Danna Hefty, MD   6 months ago Dyslipidemia associated with type 2 diabetes mellitus [facility])   [facility] Alba Cory, MD   9 months ago Tick bite, initial encounter   [facility] [place], Gerome Apley, FNP   10 months ago Dyslipidemia associated with type 2 diabetes mellitus [facility])   [facility] Alba Cory, MD   10 months ago Dyslipidemia associated with type 2 diabetes mellitus [facility])   [facility] Alba Cory, MD      Future Appointments            In 2 weeks Alba Cory, MD [facility], [facility]

## 2020-01-03 ENCOUNTER — Other Ambulatory Visit: Payer: Self-pay | Admitting: Family Medicine

## 2020-01-03 DIAGNOSIS — E1169 Type 2 diabetes mellitus with other specified complication: Secondary | ICD-10-CM

## 2020-01-06 ENCOUNTER — Encounter: Payer: Self-pay | Admitting: Family Medicine

## 2020-01-06 ENCOUNTER — Ambulatory Visit: Payer: PRIVATE HEALTH INSURANCE | Admitting: Family Medicine

## 2020-01-06 ENCOUNTER — Other Ambulatory Visit: Payer: Self-pay

## 2020-01-06 VITALS — BP 130/84 | HR 78 | Temp 97.9°F | Resp 16 | Ht 68.0 in | Wt 259.4 lb

## 2020-01-06 DIAGNOSIS — E559 Vitamin D deficiency, unspecified: Secondary | ICD-10-CM

## 2020-01-06 DIAGNOSIS — E1169 Type 2 diabetes mellitus with other specified complication: Secondary | ICD-10-CM | POA: Diagnosis not present

## 2020-01-06 DIAGNOSIS — E7800 Pure hypercholesterolemia, unspecified: Secondary | ICD-10-CM

## 2020-01-06 DIAGNOSIS — R399 Unspecified symptoms and signs involving the genitourinary system: Secondary | ICD-10-CM

## 2020-01-06 DIAGNOSIS — F419 Anxiety disorder, unspecified: Secondary | ICD-10-CM

## 2020-01-06 DIAGNOSIS — G8929 Other chronic pain: Secondary | ICD-10-CM

## 2020-01-06 DIAGNOSIS — Z0000 Encounter for general adult medical examination without abnormal findings: Secondary | ICD-10-CM

## 2020-01-06 DIAGNOSIS — E785 Hyperlipidemia, unspecified: Secondary | ICD-10-CM

## 2020-01-06 DIAGNOSIS — K219 Gastro-esophageal reflux disease without esophagitis: Secondary | ICD-10-CM

## 2020-01-06 DIAGNOSIS — E669 Obesity, unspecified: Secondary | ICD-10-CM | POA: Diagnosis not present

## 2020-01-06 DIAGNOSIS — M542 Cervicalgia: Secondary | ICD-10-CM

## 2020-01-06 DIAGNOSIS — H8110 Benign paroxysmal vertigo, unspecified ear: Secondary | ICD-10-CM

## 2020-01-06 DIAGNOSIS — N529 Male erectile dysfunction, unspecified: Secondary | ICD-10-CM

## 2020-01-06 LAB — POCT GLYCOSYLATED HEMOGLOBIN (HGB A1C): Hemoglobin A1C: 9.7 % — AB (ref 4.0–5.6)

## 2020-01-06 LAB — GLUCOSE, POCT (MANUAL RESULT ENTRY): POC Glucose: 195 mg/dl — AB (ref 70–99)

## 2020-01-06 MED ORDER — TRULICITY 1.5 MG/0.5ML ~~LOC~~ SOAJ: 1.5000 mg | SUBCUTANEOUS | 0 refills | Status: DC

## 2020-01-06 MED ORDER — METFORMIN HCL ER 750 MG PO TB24: 1500.0000 mg | ORAL_TABLET | Freq: Every day | ORAL | 0 refills | Status: DC

## 2020-01-06 MED ORDER — GLIPIZIDE ER 10 MG PO TB24: 10.0000 mg | ORAL_TABLET | Freq: Every day | ORAL | 0 refills | Status: DC

## 2020-01-06 MED ORDER — LOVASTATIN 40 MG PO TABS: ORAL_TABLET | ORAL | 0 refills | Status: DC

## 2020-01-06 MED ORDER — TADALAFIL 5 MG PO TABS: 5.0000 mg | ORAL_TABLET | Freq: Every day | ORAL | 2 refills | Status: DC | PRN

## 2020-01-06 MED ORDER — PIOGLITAZONE HCL 15 MG PO TABS: 15.0000 mg | ORAL_TABLET | Freq: Every day | ORAL | 0 refills | Status: DC

## 2020-01-06 NOTE — Patient Instructions (Signed)
Preventive Care 47-47 Years Old, Male Preventive care refers to lifestyle choices and visits with your health care provider that can promote health and wellness. This includes:  A yearly physical exam. This is also called an annual well check.  Regular dental and eye exams.  Immunizations.  Screening for certain conditions.  Healthy lifestyle choices, such as eating a healthy diet, getting regular exercise, not using drugs or products that contain nicotine and tobacco, and limiting alcohol use. What can I expect for my preventive care visit? Physical exam Your health care provider will check:  Height and weight. These may be used to calculate body mass index (BMI), which is a measurement that tells if you are at a healthy weight.  Heart rate and blood pressure.  Your skin for abnormal spots. Counseling Your health care provider may ask you questions about:  Alcohol, tobacco, and drug use.  Emotional well-being.  Home and relationship well-being.  Sexual activity.  Eating habits.  Work and work Statistician. What immunizations do I need?  Influenza (flu) vaccine  This is recommended every year. Tetanus, diphtheria, and pertussis (Tdap) vaccine  You may need a Td booster every 10 years. Varicella (chickenpox) vaccine  You may need this vaccine if you have not already been vaccinated. Zoster (shingles) vaccine  You may need this after age 47. Measles, mumps, and rubella (MMR) vaccine  You may need at least one dose of MMR if you were born in 1957 or later. You may also need a second dose. Pneumococcal conjugate (PCV13) vaccine  You may need this if you have certain conditions and were not previously vaccinated. Pneumococcal polysaccharide (PPSV23) vaccine  You may need one or two doses if you smoke cigarettes or if you have certain conditions. Meningococcal conjugate (MenACWY) vaccine  You may need this if you have certain conditions. Hepatitis A  vaccine  You may need this if you have certain conditions or if you travel or work in places where you may be exposed to hepatitis A. Hepatitis B vaccine  You may need this if you have certain conditions or if you travel or work in places where you may be exposed to hepatitis B. Haemophilus influenzae type b (Hib) vaccine  You may need this if you have certain risk factors. Human papillomavirus (HPV) vaccine  If recommended by your health care provider, you may need three doses over 6 months. You may receive vaccines as individual doses or as more than one vaccine together in one shot (combination vaccines). Talk with your health care provider about the risks and benefits of combination vaccines. What tests do I need? Blood tests  Lipid and cholesterol levels. These may be checked every 5 years, or more frequently if you are over 60 years old.  Hepatitis C test.  Hepatitis B test. Screening  Lung cancer screening. You may have this screening every year starting at age 47 if you have a 30-pack-year history of smoking and currently smoke or have quit within the past 15 years.  Prostate cancer screening. Recommendations will vary depending on your family history and other risks.  Colorectal cancer screening. All adults should have this screening starting at age 47 and continuing until age 2. Your health care provider may recommend screening at age 47 if you are at increased risk. You will have tests every 1-10 years, depending on your results and the type of screening test.  Diabetes screening. This is done by checking your blood sugar (glucose) after you have not eaten  for a while (fasting). You may have this done every 1-3 years.  Sexually transmitted disease (STD) testing. Follow these instructions at home: Eating and drinking  Eat a diet that includes fresh fruits and vegetables, whole grains, lean protein, and low-fat dairy products.  Take vitamin and mineral supplements as  recommended by your health care provider.  Do not drink alcohol if your health care provider tells you not to drink.  If you drink alcohol: ? Limit how much you have to 0-2 drinks a day. ? Be aware of how much alcohol is in your drink. In the [place] one drink equals one 12 oz bottle of beer (355 mL), one 5 oz glass of wine (148 mL), or one 1 oz glass of hard liquor (44 mL). Lifestyle  Take daily care of your teeth and gums.  Stay active. Exercise for at least 30 minutes on 5 or more days each week.  Do not use any products that contain nicotine or tobacco, such as cigarettes, e-cigarettes, and chewing tobacco. If you need help quitting, ask your health care provider.  If you are sexually active, practice safe sex. Use a condom or other form of protection to prevent STIs (sexually transmitted infections).  Talk with your health care provider about taking a low-dose aspirin every day starting at age 47. What's next?  Go to your health care provider once a year for a well check visit.  Ask your health care provider how often you should have your eyes and teeth checked.  Stay up to date on all vaccines. This information is not intended to replace advice given to you by your health care provider. Make sure you discuss any questions you have with your health care provider. Document Revised: 09/24/2018 Document Reviewed: 09/24/2018 Elsevier Patient Education  2020 [employer].

## 2020-01-06 NOTE — Progress Notes (Addendum)
Name: Trevor Dennis.   MRN: [id number]    DOB: 15-Dec-1970   Date:01/06/2020       Progress Note  Subjective  Chief Complaint  Chief Complaint  Patient presents with  . Annual Exam  . Diabetes  . Hyperlipidemia    HPI  Patient presents for annual CPE and follow up  Diabetes Type 2 :A1C  January 2020 was 8% , May 2020 it was8.7%9 % August, 8.23 July 2019 today is up to 9.7% , He was doing very well on GLP-1 agonist however the cost was prohibitive. On his last visit we continued  Metformin 1500 mg, increased dose of Glipizide to 10 mg and added Actos 15 mg. His home glucose has been 150s-190's fasting . He has noticed  polyphagia, polydipsia or polyuria. He has obesity, fatty liver, dyslipidemia, and eD associated with DM. We will add Cialis, we will try resuming Trulicity today. Discussed importance of resuming a healthy diet   Chronic neck/Back pain: getting better since warmer weather   Fatty liver; he had [place] in 2018, his last liver enzymes had improved, explained importance of better diabetes control to keep levels down.last liver enzymes stable  Hyperlipidemia: his LDL was reviewed and at goal, LDl of 6912/ 2020. Doing well. No myalgia.   Morbid Obesity: he states trying to cut down on portion size and started exercising last week, he responded toGLP-1 agonist but had stop because of cost. We will try to resume medications  GERD: he takes PPI daily, discussed long term use and risk associated with it.he is doing well at this time with otc nexium now and seems to control it most of the time.   Snoring: his11yo son states snoring not as bad lately. His ESS was 10 back in Nov 2019 , he states still wakes up feeling tired sometimes, he is not interested in doing a sleep study   Leucocytosis : last level was back to normal    USPSTF grade A and B recommendations:  Diet: has improved over the past two weeks, but previously eating fast food  Exercise: he  has a home gym now , started working out two weeks ago   Depression: phq 9 is negative Depression screen [facility] 2/9 01/06/2020 10/04/2019 06/01/2019 03/04/2019 02/15/2019  Decreased Interest 0 0 0 0 0  Down, Depressed, Hopeless 0 0 0 0 0  PHQ - 2 Score 0 0 0 0 0  Altered sleeping 0 0 0 0 0  Tired, decreased energy 0 0 0 0 0  Change in appetite 0 0 0 0 0  Feeling bad or failure about yourself  0 0 0 0 0  Trouble concentrating 0 0 0 0 0  Moving slowly or fidgety/restless 0 0 0 0 0  Suicidal thoughts 0 0 0 0 0  PHQ-9 Score 0 0 0 0 0  Difficult doing work/chores Not difficult at all - - Not difficult at all Not difficult at all  Some recent data might be hidden    Hypertension:  BP Readings from Last 3 Encounters:  01/06/20 130/84  06/01/19 126/72  11/24/18 138/76    Obesity: Wt Readings from Last 3 Encounters:  01/06/20 259 lb 6.4 oz (117.7 kg)  06/01/19 242 lb (109.8 kg)  02/01/19 245 lb (111.1 kg)   BMI Readings from Last 3 Encounters:  01/06/20 39.44 kg/m  06/01/19 36.80 kg/m  02/01/19 39.54 kg/m     Lipids:  Lab Results  Component Value Date   CHOL  144 10/05/2019   CHOL 140 10/15/2018   CHOL 158 11/26/2017   Lab Results  Component Value Date   HDL 56 10/05/2019   HDL 47 10/15/2018   HDL 51 11/26/2017   Lab Results  Component Value Date   LDLCALC 69 10/05/2019   LDLCALC 69 10/15/2018   LDLCALC 81 11/26/2017   Lab Results  Component Value Date   TRIG 102 10/05/2019   TRIG 161 (H) 10/15/2018   TRIG 155 (H) 11/26/2017   Lab Results  Component Value Date   CHOLHDL 2.6 10/05/2019   CHOLHDL 3.0 10/15/2018   CHOLHDL 3.1 11/26/2017   No results found for: LDLDIRECT Glucose:  Glucose, Bld  Date Value Ref Range Status  10/05/2019 169 (H) 65 - 99 mg/dL Final    Comment:    .            Fasting reference interval . For someone without known diabetes, a glucose value >125 mg/dL indicates that they may have diabetes and this should be confirmed with  a follow-up test. .   10/15/2018 196 (H) 65 - 99 mg/dL Final    Comment:    .            Fasting reference interval . For someone without known diabetes, a glucose value >125 mg/dL indicates that they may have diabetes and this should be confirmed with a follow-up test. .   11/26/2017 142 (H) 65 - 99 mg/dL Final    Comment:    .            Fasting reference interval . For someone without known diabetes, a glucose value >125 mg/dL indicates that they may have diabetes and this should be confirmed with a follow-up test. .       Office Visit from 01/06/2020 in [facility]  AUDIT-C Score  1       Divorced STD testing and prevention (HIV/chl/gon/syphilis): not interested  Hep C: 05/2017   Skin cancer: Discussed monitoring for atypical lesions Colorectal cancer: placing a referral  Prostate cancer: discussed USPTF  Lab Results  Component Value Date   PSA 0.5 01/23/2017    IPSS Questionnaire (AUA-7): Over the past month.   1)  How often have you had a sensation of not emptying your bladder completely after you finish urinating?  0 - Not at all  2)  How often have you had to urinate again less than two hours after you finished urinating? 1 - Less than 1 time in 5  3)  How often have you found you stopped and started again several times when you urinated?  3 - About half the time  4) How difficult have you found it to postpone urination?  5 - Almost always  5) How often have you had a weak urinary stream?  0 - Not at all  6) How often have you had to push or strain to begin urination?  0- not at all   7) How many times did you most typically get up to urinate from the time you went to bed until the time you got up in the morning?  2 - 2 times  Total score:  0-7 mildly symptomatic   8-19 moderately symptomatic   20-35 severely symptomatic    Lung cancer:  Low Dose CT Chest recommended if Age 34-80 years, 30 pack-year currently smoking OR have quit  w/in 15years. Patient does not qualify.   AAA:  The USPSTF recommends one-time screening  with ultrasonography in men ages 44 to 74 years who have ever smoked ECG:  01/2010  Advanced Care Planning: A voluntary discussion about advance care planning including the explanation and discussion of advance directives.  Discussed health care proxy and Living will, and the patient was able to identify a health care proxy as father, Marl Seago Sr.  Patient does not have a living will at present time.   Patient Active Problem List   Diagnosis Date Noted  . Chronic neck pain 11/17/2018  . History of lumbar discectomy 03/04/2018  . Fatigue 11/26/2017  . Hypotestosteronism 11/26/2017  . History of subarachnoid hemorrhage 09/17/2017  . Vitamin D insufficiency 08/26/2017  . GERD (gastroesophageal reflux disease) 10/25/2016  . Morbid obesity ([place]) 10/25/2016  . 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 Anxiety 01/01/2016  . Elevated liver enzymes 07/27/2015  . Skin lesion of back 07/27/2015  . Intermittent low back pain 07/27/2015  . Fatty liver 05/03/2015  . Dyslipidemia associated with type 2 diabetes mellitus ([place]) 04/24/2015  . HLD (hyperlipidemia) 04/24/2015  . Fever blister 04/24/2015    Past Surgical History:  Procedure Laterality Date  . BACK SURGERY  8 years ago    Family History  Problem Relation Age of Onset  . Diabetes Mother   . Liver disease Brother   . Alcohol abuse Brother   . ADD / ADHD Son   . ADD / ADHD Son     Social History   Socioeconomic History  . Marital status: Divorced    Spouse name: Not on file  . Number of children: 2  . Years of education: Not on file  . Highest education level: Associate degree: occupational, Hotel manager, or vocational program  Occupational History  . Occupation: Owner    Comment: co-owner   Tobacco Use  . Smoking status: Current Some Day Smoker    Packs/day: 1.00    Years: 26.00    Pack years: 26.00    Types:  Cigarettes, Cigars    Start date: 10/14/1989    Last attempt to quit: 06/04/2016    Years since quitting: 3.5  . Smokeless tobacco: Never Used  . Tobacco comment: he smokes cigars or occasional cigarettes   Substance and Sexual Activity  . Alcohol use: Yes    Alcohol/week: 0.0 standard drinks    Comment: Occasional   . Drug use: No  . Sexual activity: Not Currently    Partners: Female  Other Topics Concern  . Not on file  Social History Narrative   Divorced, ex wife used to cheat on him and also was an alcoholic    He has joint custody of his younger son. Her drinking is under better control   Older son is 83 and in the [employer]    Social Determinants of Health   Financial Resource Strain: Low Risk   . Difficulty of Paying Living Expenses: Not hard at all  Food Insecurity: No Food Insecurity  . Worried About Charity fundraiser in the Last Year: Never true  . Ran Out of Food in the Last Year: Never true  Transportation Needs: No Transportation Needs  . Lack of Transportation (Medical): No  . Lack of Transportation (Non-Medical): No  Physical Activity: Insufficiently Active  . Days of Exercise per Week: 3 days  . Minutes of Exercise per Session: 30 min  Stress: Stress Concern Present  . Feeling of Stress : To some extent  Social Connections: Somewhat Isolated  . Frequency of Communication with  Friends and Family: More than three times a week  . Frequency of Social Gatherings with Friends and Family: Twice a week  . Attends Religious Services: 1 to 4 times per year  . Active Member of Clubs or Organizations: Patient refused  . Attends Archivist Meetings: Never  . Marital Status: Divorced  Human resources officer Violence: Not At Risk  . Fear of Current or Ex-Partner: No  . Emotionally Abused: No  . Physically Abused: No  . Sexually Abused: No     Current Outpatient Medications:  .  ALPRAZolam (XANAX) 0.5 MG tablet, Take 1 tablet (0.5 mg total) by mouth daily as  needed., Disp: 30 tablet, Rfl: 0 .  Blood Glucose Monitoring Suppl (ONE TOUCH ULTRA MINI) W/DEVICE KIT, See admin instructions., Disp: , Rfl: 0 .  diclofenac (VOLTAREN) 75 MG EC tablet, TAKE 1 TABLET BY MOUTH TWICE A DAY, Disp: 60 tablet, Rfl: 0 .  glipiZIDE (GLUCOTROL XL) 10 MG 24 hr tablet, Take 1 tablet (10 mg total) by mouth daily with breakfast., Disp: 90 tablet, Rfl: 0 .  lovastatin (MEVACOR) 40 MG tablet, TAKE 1 TABLET BY MOUTH EVERYDAY AT BEDTIME, Disp: 90 tablet, Rfl: 0 .  metFORMIN (GLUCOPHAGE-XR) 750 MG 24 hr tablet, Take 2 tablets (1,500 mg total) by mouth daily with breakfast., Disp: 180 tablet, Rfl: 0 .  Multiple Vitamin (MULTI VITAMIN DAILY PO), Take by mouth daily. , Disp: , Rfl:  .  NEXIUM 24HR 20 MG capsule, TAKE 1 CAPSULE BY MOUTH EVERY DAY, Disp: 90 capsule, Rfl: 3 .  ONE TOUCH ULTRA TEST test strip, USE TO TEST SUGAR 3 TIMES A DAY, Disp: , Rfl: 12 .  pioglitazone (ACTOS) 15 MG tablet, Take 1 tablet (15 mg total) by mouth daily., Disp: 90 tablet, Rfl: 0 .  valACYclovir (VALTREX) 1000 MG tablet, Take 1 tablet (1,000 mg total) by mouth 2 (two) times daily., Disp: 20 tablet, Rfl: 0 .  Dulaglutide (TRULICITY) 1.5 WL/8.9HT SOPN, Inject 1.5 mg into the skin once a week., Disp: 6 mL, Rfl: 0 .  tadalafil (CIALIS) 5 MG tablet, Take 1 tablet (5 mg total) by mouth daily as needed for erectile dysfunction., Disp: 30 tablet, Rfl: 2  No Known Allergies   ROS  Constitutional: Negative for fever or weight change.  Respiratory: Negative for cough and shortness of breath.   Cardiovascular: Negative for chest pain or palpitations.  Gastrointestinal: Negative for abdominal pain, no bowel changes.  Musculoskeletal: Negative for gait problem or joint swelling.  Skin: Negative for rash.  Neurological: Negative for dizziness or headache.  No other specific complaints in a complete review of systems (except as listed in HPI above).   Objective  Vitals:   01/06/20 0822  BP: 130/84  Pulse:  78  Resp: 16  Temp: 97.9 F (36.6 C)  TempSrc: Temporal  SpO2: 96%  Weight: 259 lb 6.4 oz (117.7 kg)  Height: '5\' 8"'  (1.727 m)    Body mass index is 39.44 kg/m.  Physical Exam  Constitutional: Patient appears well-developed and well-nourished. No distress.  HENT: Head: Normocephalic and atraumatic. Ears: B TMs ok, no erythema or effusion; Nose: Nose normal. Mouth/Throat: Oropharynx is clear and moist. No oropharyngeal exudate.  Eyes: Conjunctivae and EOM are normal. Pupils are equal, round, and reactive to light. No scleral icterus.  Neck: Normal range of motion. Neck supple. No JVD present. No thyromegaly present.  Cardiovascular: Normal rate, regular rhythm and normal heart sounds.  No murmur heard. No BLE edema. Pulmonary/Chest: Effort  normal and breath sounds normal. No respiratory distress. Abdominal: Soft. Bowel sounds are normal, no distension. There is no tenderness. no masses MALE GENITALIA: Normal descended testes bilaterally, no masses palpated, no hernias, no lesions, no discharge RECTAL: Prostate normal size and consistency, no rectal masses or hemorrhoids  Musculoskeletal: Normal range of motion, no joint effusions. No gross deformities Neurological: he is alert and oriented to person, place, and time. No cranial nerve deficit. Coordination, balance, strength, speech and gait are normal.  Skin: Skin is warm and dry. No rash noted. No erythema.  Psychiatric: Patient has a normal mood and affect. behavior is normal. Judgment and thought content normal.   PHQ2/9: Depression screen [facility] 2/9 01/06/2020 10/04/2019 06/01/2019 03/04/2019 02/15/2019  Decreased Interest 0 0 0 0 0  Down, Depressed, Hopeless 0 0 0 0 0  PHQ - 2 Score 0 0 0 0 0  Altered sleeping 0 0 0 0 0  Tired, decreased energy 0 0 0 0 0  Change in appetite 0 0 0 0 0  Feeling bad or failure about yourself  0 0 0 0 0  Trouble concentrating 0 0 0 0 0  Moving slowly or fidgety/restless 0 0 0 0 0  Suicidal thoughts 0  0 0 0 0  PHQ-9 Score 0 0 0 0 0  Difficult doing work/chores Not difficult at all - - Not difficult at all Not difficult at all  Some recent data might be hidden     Fall Risk: Fall Risk  01/06/2020 10/04/2019 06/01/2019 03/04/2019 02/15/2019  Falls in the past year? 0 0 0 0 0  Number falls in past yr: 0 0 0 0 0  Injury with Fall? 0 0 0 0 0  Follow up Falls evaluation completed - - - -   Diabetic Foot Exam - Simple   Simple Foot Form Diabetic Foot exam was performed with the following findings: Yes 01/06/2020  9:33 AM  Visual Inspection No deformities, no ulcerations, no other skin breakdown bilaterally: Yes Sensation Testing See comments: Yes Pulse Check Posterior Tibialis and Dorsalis pulse intact bilaterally: Yes Comments Lack of sensation on left fist toe from back surgery       Assessment & Plan  1. Diabetes mellitus type 2 in obese (HCC)  - POCT HgB A1C - Dulaglutide (TRULICITY) 1.5 TA/5.6PV SOPN; Inject 1.5 mg into the skin once a week.  Dispense: 6 mL; Refill: 0 - pioglitazone (ACTOS) 15 MG tablet; Take 1 tablet (15 mg total) by mouth daily.  Dispense: 90 tablet; Refill: 0 - metFORMIN (GLUCOPHAGE-XR) 750 MG 24 hr tablet; Take 2 tablets (1,500 mg total) by mouth daily with breakfast.  Dispense: 180 tablet; Refill: 0  2. Morbid obesity ([place])  Discussed with the patient the risk posed by an increased BMI. Discussed importance of portion control, calorie counting and at least 150 minutes of physical activity weekly. Avoid sweet beverages and drink more water. Eat at least 6 servings of fruit and vegetables daily   3. Dyslipidemia associated with type 2 diabetes mellitus (HCC)  - pioglitazone (ACTOS) 15 MG tablet; Take 1 tablet (15 mg total) by mouth daily.  Dispense: 90 tablet; Refill: 0 - metFORMIN (GLUCOPHAGE-XR) 750 MG 24 hr tablet; Take 2 tablets (1,500 mg total) by mouth daily with breakfast.  Dispense: 180 tablet; Refill: 0 - lovastatin (MEVACOR) 40 MG tablet; TAKE 1  TABLET BY MOUTH EVERYDAY AT BEDTIME  Dispense: 90 tablet; Refill: 0 - glipiZIDE (GLUCOTROL XL) 10 MG 24 hr tablet; Take 1  tablet (10 mg total) by mouth daily with breakfast.  Dispense: 90 tablet; Refill: 0  4. Gastroesophageal reflux disease without esophagitis   5. Vitamin D insufficiency   6. Chronic neck pain  Better with warmer weather   7. Pure hypercholesterolemia  - lovastatin (MEVACOR) 40 MG tablet; TAKE 1 TABLET BY MOUTH EVERYDAY AT BEDTIME  Dispense: 90 tablet; Refill: 0  8. Benign paroxysmal vertigo, unspecified laterality  Doing well now   9. Anxiety  Stable , he still has alprazolam at home  10. Lower urinary tract symptoms (LUTS)  - PSA - tadalafil (CIALIS) 5 MG tablet; Take 1 tablet (5 mg total) by mouth daily as needed for erectile dysfunction.  Dispense: 30 tablet; Refill: 2  11. Erectile dysfunction, unspecified erectile dysfunction type  - tadalafil (CIALIS) 5 MG tablet; Take 1 tablet (5 mg total) by mouth daily as needed for erectile dysfunction.  Dispense: 30 tablet; Refill: 2  12. Well adult exam   -Prostate cancer screening and PSA options (with potential risks and benefits of testing vs not testing) were discussed along with recent recs/guidelines. -USPSTF grade A and B recommendations reviewed with patient; age-appropriate recommendations, preventive care, screening tests, etc discussed and encouraged; healthy living encouraged; see AVS for patient education given to patient -Discussed importance of 150 minutes of physical activity weekly, eat two servings of fish weekly, eat one serving of tree nuts ( cashews, pistachios, pecans, almonds.Marland Kitchen) every other day, eat 6 servings of fruit/vegetables daily and drink plenty of water and avoid sweet beverages.

## 2020-03-20 ENCOUNTER — Other Ambulatory Visit: Payer: Self-pay | Admitting: Family Medicine

## 2020-03-20 DIAGNOSIS — E1169 Type 2 diabetes mellitus with other specified complication: Secondary | ICD-10-CM

## 2020-03-29 ENCOUNTER — Other Ambulatory Visit: Payer: Self-pay

## 2020-03-29 DIAGNOSIS — E785 Hyperlipidemia, unspecified: Secondary | ICD-10-CM

## 2020-03-29 DIAGNOSIS — E1169 Type 2 diabetes mellitus with other specified complication: Secondary | ICD-10-CM

## 2020-03-29 MED ORDER — METFORMIN HCL ER 750 MG PO TB24: 1500.0000 mg | ORAL_TABLET | Freq: Every day | ORAL | 0 refills | Status: DC

## 2020-04-07 ENCOUNTER — Other Ambulatory Visit: Payer: Self-pay

## 2020-04-07 ENCOUNTER — Ambulatory Visit: Payer: 59 | Admitting: Family Medicine

## 2020-04-07 ENCOUNTER — Encounter: Payer: Self-pay | Admitting: Family Medicine

## 2020-04-07 VITALS — BP 114/82 | HR 76 | Temp 98.1°F | Resp 18 | Ht 68.0 in | Wt 255.1 lb

## 2020-04-07 DIAGNOSIS — K760 Fatty (change of) liver, not elsewhere classified: Secondary | ICD-10-CM

## 2020-04-07 DIAGNOSIS — E7800 Pure hypercholesterolemia, unspecified: Secondary | ICD-10-CM

## 2020-04-07 DIAGNOSIS — E1169 Type 2 diabetes mellitus with other specified complication: Secondary | ICD-10-CM

## 2020-04-07 DIAGNOSIS — B353 Tinea pedis: Secondary | ICD-10-CM

## 2020-04-07 DIAGNOSIS — E559 Vitamin D deficiency, unspecified: Secondary | ICD-10-CM | POA: Diagnosis not present

## 2020-04-07 DIAGNOSIS — E669 Obesity, unspecified: Secondary | ICD-10-CM

## 2020-04-07 DIAGNOSIS — K219 Gastro-esophageal reflux disease without esophagitis: Secondary | ICD-10-CM

## 2020-04-07 DIAGNOSIS — E785 Hyperlipidemia, unspecified: Secondary | ICD-10-CM

## 2020-04-07 LAB — POCT GLYCOSYLATED HEMOGLOBIN (HGB A1C): Hemoglobin A1C: 7.3 % — AB (ref 4.0–5.6)

## 2020-04-07 MED ORDER — TERBINAFINE HCL 250 MG PO TABS: 250.0000 mg | ORAL_TABLET | Freq: Every day | ORAL | 0 refills | Status: DC

## 2020-04-07 MED ORDER — KETOCONAZOLE 2 % EX CREA: 1.0000 "application " | TOPICAL_CREAM | Freq: Every day | CUTANEOUS | 0 refills | Status: DC

## 2020-04-07 NOTE — Progress Notes (Signed)
Name: Trevor Dennis.   MRN: [id number]    DOB: 1971/05/28   Date:04/07/2020       Progress Note  Subjective  Chief Complaint  Chief Complaint  Patient presents with  . Follow-up  . Diabetes  . Tinea Pedis    no resolve with OTC treatments    HPI  Diabetes Type 2 :A1C January 2020 was 8%, May 2020 it was8.7%9% August, 8.23 July 2019 , March it was   9.7% and today is down to 7.3 % ,He was doing very well on GLP-1 agonist however the cost was prohibitive.He is back on Trulicity since he found a coupon and has been able to resume medication. He is still on Metformin 1500 mg,  Glipizide to 10 mg and added Actos 15 mg. His home glucose has been dropping to 75 a few times, he states usually around 5 pm - before dinner, advised a protein snack before 5 pm . He has noticed  polyphagia, polydipsia or polyuria. He has obesity, fatty liver, dyslipidemia and ED  associated with DM. We gave him Cialis last visit but he states cost and number of pills , he decided not to get it. A1C is much better, he has resumed a healthier diet, portion control, exercising more and is back on Trulicity, discussed stopping Glipizide and increasing dose of Trulicity , however he just filled all his medications for 3 months and we will wait until next visit.   Chronic neck/Back pain: getting better since warmer weather , he has been more active, symptoms are intermittent   Fatty liver; he had [place] in 2018, his last liver enzymes had improved, better diabetes control will help fatty liver   Hyperlipidemia: his LDL was reviewed and at goal, LDl of 6912/ 2020. Doing well. No myalgia.Continue medication   Morbid Obesity: he is doing better, changed diet, portion control, losing weight.   GERD: he takes PPI daily, discussed long term use and risk associated with it.he is doing well at this time withotc Omeprazole and is doing well   Snoring: his11yo sonstates snoring not as bad lately. His ESS  was 10 back in Nov 2019 , he states still wakes up feeling tired sometimes, he is not interested in doing a sleep study. Unchanged   Tinea pedis: he went to [employer] this past weekend, got back home and had pruritis on right foot on top and between 1 st and 2nd toe, using lotrimin and an anti-fungal spray but is still spreading, he also has ketoconazole at home but has expired   Patient Active Problem List   Diagnosis Date Noted  . Chronic neck pain 11/17/2018  . History of lumbar discectomy 03/04/2018  . Fatigue 11/26/2017  . Hypotestosteronism 11/26/2017  . History of subarachnoid hemorrhage 09/17/2017  . Vitamin D insufficiency 08/26/2017  . GERD (gastroesophageal reflux disease) 10/25/2016  . Morbid obesity ([place]) 10/25/2016  . 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 Anxiety 01/01/2016  . Elevated liver enzymes 07/27/2015  . Skin lesion of back 07/27/2015  . Intermittent low back pain 07/27/2015  . Fatty liver 05/03/2015  . Dyslipidemia associated with type 2 diabetes mellitus ([place]) 04/24/2015  . HLD (hyperlipidemia) 04/24/2015  . Fever blister 04/24/2015    Past Surgical History:  Procedure Laterality Date  . BACK SURGERY  8 years ago    Family History  Problem Relation Age of Onset  . Diabetes Mother   . Liver disease Brother   . Alcohol abuse  Brother   . ADD / ADHD Son   . ADD / ADHD Son     Social History   Tobacco Use  . Smoking status: Current Some Day Smoker    Packs/day: 1.00    Years: 26.00    Pack years: 26.00    Types: Cigarettes, Cigars    Start date: 10/14/1989    Last attempt to quit: 06/04/2016    Years since quitting: 3.8  . Smokeless tobacco: Never Used  . Tobacco comment: he smokes cigars or occasional cigarettes   Substance Use Topics  . Alcohol use: Yes    Alcohol/week: 0.0 standard drinks    Comment: Occasional      Current Outpatient Medications:  .  ALPRAZolam (XANAX) 0.5 MG tablet, Take 1 tablet (0.5 mg total)  by mouth daily as needed., Disp: 30 tablet, Rfl: 0 .  Blood Glucose Monitoring Suppl (ONE TOUCH ULTRA MINI) W/DEVICE KIT, See admin instructions., Disp: , Rfl: 0 .  diclofenac (VOLTAREN) 75 MG EC tablet, TAKE 1 TABLET BY MOUTH TWICE A DAY, Disp: 60 tablet, Rfl: 0 .  glipiZIDE (GLUCOTROL XL) 10 MG 24 hr tablet, Take 1 tablet (10 mg total) by mouth daily with breakfast., Disp: 90 tablet, Rfl: 0 .  lovastatin (MEVACOR) 40 MG tablet, TAKE 1 TABLET BY MOUTH EVERYDAY AT BEDTIME, Disp: 90 tablet, Rfl: 0 .  metFORMIN (GLUCOPHAGE-XR) 750 MG 24 hr tablet, Take 2 tablets (1,500 mg total) by mouth daily with breakfast., Disp: 180 tablet, Rfl: 0 .  Multiple Vitamin (MULTI VITAMIN DAILY PO), Take by mouth daily. , Disp: , Rfl:  .  NEXIUM 24HR 20 MG capsule, TAKE 1 CAPSULE BY MOUTH EVERY DAY, Disp: 90 capsule, Rfl: 3 .  pioglitazone (ACTOS) 15 MG tablet, Take 1 tablet (15 mg total) by mouth daily., Disp: 90 tablet, Rfl: 0 .  tadalafil (CIALIS) 5 MG tablet, Take 1 tablet (5 mg total) by mouth daily as needed for erectile dysfunction., Disp: 30 tablet, Rfl: 2 .  TRULICITY 1.5 XO/3.2NV SOPN, INJECT 1.5 MG INTO THE SKIN ONCE A WEEK., Disp: 6 mL, Rfl: 0 .  valACYclovir (VALTREX) 1000 MG tablet, Take 1 tablet (1,000 mg total) by mouth 2 (two) times daily., Disp: 20 tablet, Rfl: 0 .  ONE TOUCH ULTRA TEST test strip, USE TO TEST SUGAR 3 TIMES A DAY, Disp: , Rfl: 12  No Known Allergies  I personally reviewed active problem list, medication list, allergies, family history, social history, health maintenance with the patient/caregiver today.   ROS  Constitutional: Negative for fever or significant weight change.  Respiratory: Negative for cough and shortness of breath.   Cardiovascular: Negative for chest pain or palpitations.  Gastrointestinal: Negative for abdominal pain, no bowel changes.  Musculoskeletal: Negative for gait problem or joint swelling.  Skin: Positive  for rash.  Neurological: Negative for  dizziness or headache.  No other specific complaints in a complete review of systems (except as listed in HPI above).  Objective  Vitals:   04/07/20 0852  BP: 114/82  Pulse: 76  Resp: 18  Temp: 98.1 F (36.7 C)  TempSrc: Temporal  SpO2: 97%  Weight: 255 lb 1.6 oz (115.7 kg)  Height: '5\' 8"'  (1.727 m)    Body mass index is 38.79 kg/m.  Physical Exam  Constitutional: Patient appears well-developed and well-nourished. Obese  No distress.  HEENT: head atraumatic, normocephalic, pupils equal and reactive to light, neck supple Cardiovascular: Normal rate, regular rhythm and normal heart sounds.  No murmur heard.  No BLE edema. Pulmonary/Chest: Effort normal and breath sounds normal. No respiratory distress. Abdominal: Soft.  There is no tenderness. Psychiatric: Patient has a normal mood and affect. behavior is normal. Judgment and thought content normal.. Skin: erythematous rash, well demarcated on top of right foot and between toes  Recent Results (from the past 2160 hour(s))  POCT HgB A1C     Status: Abnormal   Collection Time: 04/07/20  8:58 AM  Result Value Ref Range   Hemoglobin A1C 7.3 (A) 4.0 - 5.6 %   HbA1c POC (<> result, manual entry)     HbA1c, POC (prediabetic range)     HbA1c, POC (controlled diabetic range)        PHQ2/9: Depression screen [facility] 2/9 04/07/2020 01/06/2020 10/04/2019 06/01/2019 03/04/2019  Decreased Interest 0 0 0 0 0  Down, Depressed, Hopeless 0 0 0 0 0  PHQ - 2 Score 0 0 0 0 0  Altered sleeping 0 0 0 0 0  Tired, decreased energy 0 0 0 0 0  Change in appetite 0 0 0 0 0  Feeling bad or failure about yourself  0 0 0 0 0  Trouble concentrating 0 0 0 0 0  Moving slowly or fidgety/restless 0 0 0 0 0  Suicidal thoughts 0 0 0 0 0  PHQ-9 Score 0 0 0 0 0  Difficult doing work/chores - Not difficult at all - - Not difficult at all  Some recent data might be hidden    phq 9 is negative   Fall Risk: Fall Risk  04/07/2020 04/07/2020 01/06/2020 10/04/2019  06/01/2019  Falls in the past year? 0 0 0 0 0  Number falls in past yr: 0 0 0 0 0  Injury with Fall? 1 0 0 0 0  Follow up - - Falls evaluation completed - -     Functional Status Survey: Is the patient deaf or have difficulty hearing?: No Does the patient have difficulty seeing, even when wearing glasses/contacts?: No Does the patient have difficulty concentrating, remembering, or making decisions?: No Does the patient have difficulty walking or climbing stairs?: No Does the patient have difficulty dressing or bathing?: No Does the patient have difficulty doing errands alone such as visiting a doctor's office or shopping?: No    Assessment & Plan  1. Diabetes mellitus type 2 in obese (HCC)  - POCT HgB A1C  2. Morbid obesity ([place])  Discussed with the patient the risk posed by an increased BMI. Discussed importance of portion control, calorie counting and at least 150 minutes of physical activity weekly. Avoid sweet beverages and drink more water. Eat at least 6 servings of fruit and vegetables daily   3. Dyslipidemia associated with type 2 diabetes mellitus ([place])  Continue statin therapy   4. Vitamin D insufficiency  On supplementation   5. Gastroesophageal reflux disease without esophagitis  Doing well  6. Pure hypercholesterolemia   7. Fatty liver  Stable  8. Tinea pedis of right foot   - terbinafine (LAMISIL) 250 MG tablet; Take 1 tablet (250 mg total) by mouth daily.  Dispense: 14 tablet; Refill: 0 - ketoconazole (NIZORAL) 2 % cream; Apply 1 application topically daily.  Dispense: 30 g; Refill: 0

## 2020-04-19 ENCOUNTER — Other Ambulatory Visit: Payer: Self-pay | Admitting: Family Medicine

## 2020-04-19 DIAGNOSIS — Z9889 Other specified postprocedural states: Secondary | ICD-10-CM

## 2020-04-19 DIAGNOSIS — G8929 Other chronic pain: Secondary | ICD-10-CM

## 2020-04-19 DIAGNOSIS — M542 Cervicalgia: Secondary | ICD-10-CM

## 2020-04-19 NOTE — Telephone Encounter (Signed)
Requested Prescriptions  Pending Prescriptions Disp Refills   diclofenac (VOLTAREN) 75 MG EC tablet [Pharmacy Med Name: DICLOFENAC SOD EC 75 MG TAB] 60 tablet 0    Sig: TAKE 1 TABLET BY MOUTH TWICE A DAY     Analgesics:  NSAIDS Passed - 04/19/2020  1:29 AM      Passed - Cr in normal range and within 360 days    Creat  Date Value Ref Range Status  10/05/2019 0.87 0.60 - 1.35 mg/dL Final   Creatinine, Urine  Date Value Ref Range Status  10/05/2019 189 20 - 320 mg/dL Final         Passed - HGB in normal range and within 360 days    Hemoglobin  Date Value Ref Range Status  10/05/2019 13.7 13.2 - 17.1 g/dL Final         Passed - Patient is not pregnant      Passed - Valid encounter within last 12 months    Recent Outpatient Visits          1 week ago Diabetes mellitus type 2 in obese [facility])   [facility] [place], Danna Hefty, MD   3 months ago Diabetes mellitus type 2 in obese [facility])   [facility] [place], Danna Hefty, MD   6 months ago Diabetes mellitus type 2 in obese [facility])   [facility] Alba Cory, MD   10 months ago Dyslipidemia associated with type 2 diabetes mellitus [facility])   [facility] Alba Cory, MD   1 year ago Tick bite, initial encounter   [facility] Doren Custard, FNP      Future Appointments            In 2 months Alba Cory, MD [facility], [facility]

## 2020-05-25 ENCOUNTER — Other Ambulatory Visit: Payer: Self-pay | Admitting: Family Medicine

## 2020-05-25 DIAGNOSIS — Z9889 Other specified postprocedural states: Secondary | ICD-10-CM

## 2020-05-25 DIAGNOSIS — G8929 Other chronic pain: Secondary | ICD-10-CM

## 2020-05-25 DIAGNOSIS — M542 Cervicalgia: Secondary | ICD-10-CM

## 2020-06-08 ENCOUNTER — Other Ambulatory Visit: Payer: Self-pay | Admitting: Family Medicine

## 2020-06-08 DIAGNOSIS — E1169 Type 2 diabetes mellitus with other specified complication: Secondary | ICD-10-CM

## 2020-06-08 DIAGNOSIS — E669 Obesity, unspecified: Secondary | ICD-10-CM

## 2020-06-08 NOTE — Telephone Encounter (Signed)
Requested Prescriptions  Pending Prescriptions Disp Refills  . TRULICITY 1.5 MG/0.5ML SOPN [Pharmacy Med Name: TRULICITY 1.5 MG/0.5 ML PEN]      Sig: INJECT 1.5 MG INTO THE SKIN ONCE A WEEK.     Endocrinology:  Diabetes - GLP-1 Receptor Agonists Passed - 06/08/2020  1:16 AM      Passed - HBA1C is between 0 and 7.9 and within 180 days    Hemoglobin A1C  Date Value Ref Range Status  04/07/2020 7.3 (A) 4.0 - 5.6 % Final  07/19/2019 8.9  Final   Hgb A1c MFr Bld  Date Value Ref Range Status  10/05/2019 8.7 (H) <5.7 % of total Hgb Final    Comment:    For someone without known diabetes, a hemoglobin A1c value of 6.5% or greater indicates that they may have  diabetes and this should be confirmed with a follow-up  test. . For someone with known diabetes, a value <7% indicates  that their diabetes is well controlled and a value  greater than or equal to 7% indicates suboptimal  control. A1c targets should be individualized based on  duration of diabetes, age, comorbid conditions, and  other considerations. . Currently, no consensus exists regarding use of hemoglobin A1c for diagnosis of diabetes for children. Verna Czech - Valid encounter within last 6 months    Recent Outpatient Visits          2 months ago Diabetes mellitus type 2 in obese [facility])   [facility] [place], Danna Hefty, MD   5 months ago Diabetes mellitus type 2 in obese [facility])   [facility] [place], Danna Hefty, MD   8 months ago Diabetes mellitus type 2 in obese [facility])   [facility] Alba Cory, MD   1 year ago Dyslipidemia associated with type 2 diabetes mellitus [facility])   [facility] Alba Cory, MD   1 year ago Tick bite, initial encounter   [facility] Doren Custard, FNP      Future Appointments            In 1 month Alba Cory, MD [facility], [facility]

## 2020-06-14 ENCOUNTER — Other Ambulatory Visit: Payer: Self-pay | Admitting: Family Medicine

## 2020-06-14 DIAGNOSIS — E785 Hyperlipidemia, unspecified: Secondary | ICD-10-CM

## 2020-06-14 DIAGNOSIS — E1169 Type 2 diabetes mellitus with other specified complication: Secondary | ICD-10-CM

## 2020-06-28 ENCOUNTER — Other Ambulatory Visit: Payer: Self-pay | Admitting: Family Medicine

## 2020-06-28 DIAGNOSIS — M542 Cervicalgia: Secondary | ICD-10-CM

## 2020-06-28 DIAGNOSIS — Z9889 Other specified postprocedural states: Secondary | ICD-10-CM

## 2020-06-28 DIAGNOSIS — G8929 Other chronic pain: Secondary | ICD-10-CM

## 2020-06-28 NOTE — Telephone Encounter (Signed)
Requested Prescriptions  Pending Prescriptions Disp Refills   diclofenac (VOLTAREN) 75 MG EC tablet [Pharmacy Med Name: DICLOFENAC SOD EC 75 MG TAB] 60 tablet 0    Sig: TAKE 1 TABLET BY MOUTH TWICE A DAY     Analgesics:  NSAIDS Passed - 06/28/2020  2:23 AM      Passed - Cr in normal range and within 360 days    Creat  Date Value Ref Range Status  10/05/2019 0.87 0.60 - 1.35 mg/dL Final   Creatinine, Urine  Date Value Ref Range Status  10/05/2019 189 20 - 320 mg/dL Final         Passed - HGB in normal range and within 360 days    Hemoglobin  Date Value Ref Range Status  10/05/2019 13.7 13.2 - 17.1 g/dL Final         Passed - Patient is not pregnant      Passed - Valid encounter within last 12 months    Recent Outpatient Visits          2 months ago Diabetes mellitus type 2 in obese [facility])   [facility] [place], Danna Hefty, MD   5 months ago Diabetes mellitus type 2 in obese [facility])   [facility] [place], Danna Hefty, MD   8 months ago Diabetes mellitus type 2 in obese [facility])   [facility] Alba Cory, MD   1 year ago Dyslipidemia associated with type 2 diabetes mellitus [facility])   [facility] Alba Cory, MD   1 year ago Tick bite, initial encounter   [facility] Doren Custard, [place]      Future Appointments            In 1 week Alba Cory, MD [facility], [facility]

## 2020-06-29 ENCOUNTER — Other Ambulatory Visit: Payer: Self-pay | Admitting: Family Medicine

## 2020-06-29 DIAGNOSIS — E669 Obesity, unspecified: Secondary | ICD-10-CM

## 2020-06-29 DIAGNOSIS — E1169 Type 2 diabetes mellitus with other specified complication: Secondary | ICD-10-CM

## 2020-07-10 NOTE — Progress Notes (Signed)
Name: Trevor T Amster Jr.   MRN: [id number]    DOB: 10/18/1970   Date:07/11/2020       Progress Note  Subjective  Chief Complaint  Chief Complaint  Patient presents with  . Follow-up  . Diabetes    HPI  Diabetes Type 2 :A1C January 2020 was 8%, May 2020 it was8.7%9% August, 8.23 July 2019, March it was   9.7%, 7.3 % , today is 7.2 %.  He is still on Trulicity , Metformin 1500 mg,  Glipizide to 10 mg and added Actos 15 mg, glucose has been in the 140's fasting but it goes down to 67-73 between 12 -2 pm and also between between 5-7 pm goes down again ,he eats breakfast between 8-9:30 am, usually sausage biscuit.Hedenies polyphagia, polydipsia or polyuria. He has obesity, fatty liver, dyslipidemia and ED  associated with DM. he has resumed a healthier diet, portion control, he has a physical work, he also plays disc golf a couple of times a week.   Chronic neck/Back pain:getting better since warmer weather, he has been more active, symptoms are intermittent and stable   Fatty liver; he had US in 2018, his last liver enzymes had improved, better diabetes control will help fatty liver   Hyperlipidemia: his LDL was reviewed and at goal, LDl of 6912/2020. Doing well. No myalgia.Continue medication , we will recheck labs next visit   Morbid Obesity: his weight has been stable since last visit, continue life style modification   GERD: he is off Nexium because of cost, taking omeprazole 20 mg otc, and has changed his diet, advised to wean self off by taking it every other day  Snoring: his11yo sonstates snoring not as bad lately. His ESS was 10 back in Nov 2019 , he states still wakes up feeling tired sometimes, he is not interested in doing a sleep study. Unchanged   Tinea pedis: he took Lamisil and it cleared   Uvula problems: he had three episodes of swelling of uvula, no sob or stridor , resolves by itself. Discussed allergy medication, make sure not change in diet  or environment.   Patient Active Problem List   Diagnosis Date Noted  . Chronic neck pain 11/17/2018  . History of lumbar discectomy 03/04/2018  . Fatigue 11/26/2017  . Hypotestosteronism 11/26/2017  . History of subarachnoid hemorrhage 09/17/2017  . Vitamin D insufficiency 08/26/2017  . GERD (gastroesophageal reflux disease) 10/25/2016  . Morbid obesity (HCC) 10/25/2016  . 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 Anxiety 01/01/2016  . Elevated liver enzymes 07/27/2015  . Skin lesion of back 07/27/2015  . Intermittent low back pain 07/27/2015  . Fatty liver 05/03/2015  . Dyslipidemia associated with type 2 diabetes mellitus (HCC) 04/24/2015  . HLD (hyperlipidemia) 04/24/2015  . Fever blister 04/24/2015    Past Surgical History:  Procedure Laterality Date  . BACK SURGERY  8 years ago    Family History  Problem Relation Age of Onset  . Diabetes Mother   . Liver disease Brother   . Alcohol abuse Brother   . ADD / ADHD Son   . ADD / ADHD Son     Social History   Tobacco Use  . Smoking status: Current Some Day Smoker    Packs/day: 1.00    Years: 26.00    Pack years: 26.00    Types: Cigarettes, Cigars    Start date: 10/14/1989    Last attempt to quit: 06/04/2016    Years since quitting: 4.1  .   Smokeless tobacco: Never Used  . Tobacco comment: he smokes cigars or occasional cigarettes   Substance Use Topics  . Alcohol use: Yes    Alcohol/week: 0.0 standard drinks    Comment: Occasional      Current Outpatient Medications:  .  ALPRAZolam (XANAX) 0.5 MG tablet, Take 1 tablet (0.5 mg total) by mouth daily as needed., Disp: 30 tablet, Rfl: 0 .  Blood Glucose Monitoring Suppl (ONE TOUCH ULTRA MINI) W/DEVICE KIT, See admin instructions., Disp: , Rfl: 0 .  diclofenac (VOLTAREN) 75 MG EC tablet, TAKE 1 TABLET BY MOUTH TWICE A DAY, Disp: 60 tablet, Rfl: 0 .  Dulaglutide (TRULICITY) 3 MG/0.5ML SOPN, Inject 3 mg as directed once a week., Disp: 6 mL, Rfl: 1 .   glipiZIDE (GLUCOTROL XL) 5 MG 24 hr tablet, Take 1 tablet (5 mg total) by mouth daily with breakfast., Disp: 90 tablet, Rfl: 0 .  lovastatin (MEVACOR) 40 MG tablet, TAKE 1 TABLET BY MOUTH EVERYDAY AT BEDTIME, Disp: 90 tablet, Rfl: 0 .  metFORMIN (GLUCOPHAGE-XR) 750 MG 24 hr tablet, Take 2 tablets (1,500 mg total) by mouth daily with breakfast., Disp: 180 tablet, Rfl: 0 .  Multiple Vitamin (MULTI VITAMIN DAILY PO), Take by mouth daily. , Disp: , Rfl:  .  NEXIUM 24HR 20 MG capsule, TAKE 1 CAPSULE BY MOUTH EVERY DAY (Patient not taking: Reported on 07/11/2020), Disp: 90 capsule, Rfl: 3 .  ONE TOUCH ULTRA TEST test strip, USE TO TEST SUGAR 3 TIMES A DAY, Disp: , Rfl: 12 .  pioglitazone (ACTOS) 15 MG tablet, Take 1 tablet (15 mg total) by mouth daily., Disp: 90 tablet, Rfl: 0 .  valACYclovir (VALTREX) 1000 MG tablet, Take 1 tablet (1,000 mg total) by mouth 2 (two) times daily. (Patient not taking: Reported on 07/11/2020), Disp: 20 tablet, Rfl: 0  No Known Allergies  I personally reviewed active problem list, medication list, allergies, family history, social history, health maintenance with the patient/caregiver today.   ROS  Constitutional: Negative for fever or weight change.  Respiratory: Negative for cough and shortness of breath.   Cardiovascular: Negative for chest pain or palpitations.  Gastrointestinal: Negative for abdominal pain, no bowel changes.  Musculoskeletal: Negative for gait problem or joint swelling.  Skin: Negative for rash.  Neurological: Negative for dizziness or headache.  No other specific complaints in a complete review of systems (except as listed in HPI above).  Objective  Vitals:   07/11/20 0835  BP: 112/72  Pulse: 69  Resp: 18  Temp: 98 F (36.7 C)  TempSrc: Oral  SpO2: 96%  Weight: 254 lb 4.8 oz (115.3 kg)  Height: 5' 8" (1.727 m)    Body mass index is 38.67 kg/m.   Physical Exam  Constitutional: Patient appears well-developed and well-nourished.  Obese  No distress.  HEENT: head atraumatic, normocephalic, pupils equal and reactive to light,neck supple Cardiovascular: Normal rate, regular rhythm and normal heart sounds.  No murmur heard. No BLE edema. Pulmonary/Chest: Effort normal and breath sounds normal. No respiratory distress. Abdominal: Soft.  There is no tenderness. Psychiatric: Patient has a normal mood and affect. behavior is normal. Judgment and thought content normal.   PHQ2/9: Depression screen PHQ 2/9 07/11/2020 04/07/2020 01/06/2020 10/04/2019 06/01/2019  Decreased Interest 0 0 0 0 0  Down, Depressed, Hopeless 0 0 0 0 0  PHQ - 2 Score 0 0 0 0 0  Altered sleeping 0 0 0 0 0  Tired, decreased energy 0 0 0 0 0  Change   in appetite 0 0 0 0 0  Feeling bad or failure about yourself  0 0 0 0 0  Trouble concentrating 0 0 0 0 0  Moving slowly or fidgety/restless 0 0 0 0 0  Suicidal thoughts 0 0 0 0 0  PHQ-9 Score 0 0 0 0 0  Difficult doing work/chores - - Not difficult at all - -  Some recent data might be hidden    phq 9 is negative   Fall Risk: Fall Risk  07/11/2020 04/07/2020 04/07/2020 01/06/2020 10/04/2019  Falls in the past year? 0 0 0 0 0  Number falls in past yr: 0 0 0 0 0  Injury with Fall? 0 1 0 0 0  Follow up - - - Falls evaluation completed -    Assessment & Plan  1. Diabetes mellitus type 2 in obese (HCC)  - POCT HgB A1C - Urine Microalbumin w/creat. ratio - glipiZIDE (GLUCOTROL XL) 5 MG 24 hr tablet; Take 1 tablet (5 mg total) by mouth daily with breakfast.  Dispense: 90 tablet; Refill: 0 - metFORMIN (GLUCOPHAGE-XR) 750 MG 24 hr tablet; Take 2 tablets (1,500 mg total) by mouth daily with breakfast.  Dispense: 180 tablet; Refill: 0 - pioglitazone (ACTOS) 15 MG tablet; Take 1 tablet (15 mg total) by mouth daily.  Dispense: 90 tablet; Refill: 0 - Dulaglutide (TRULICITY) 3 SW/1.0XN SOPN; Inject 3 mg as directed once a week.  Dispense: 6 mL; Refill: 1  2. Vitamin D insufficiency   3. Morbid obesity  ([place])  Discussed with the patient the risk posed by an increased BMI. Discussed importance of portion control, calorie counting and at least 150 minutes of physical activity weekly. Avoid sweet beverages and drink more water. Eat at least 6 servings of fruit and vegetables daily   4. Dyslipidemia associated with type 2 diabetes mellitus (HCC)  - glipiZIDE (GLUCOTROL XL) 5 MG 24 hr tablet; Take 1 tablet (5 mg total) by mouth daily with breakfast.  Dispense: 90 tablet; Refill: 0 - metFORMIN (GLUCOPHAGE-XR) 750 MG 24 hr tablet; Take 2 tablets (1,500 mg total) by mouth daily with breakfast.  Dispense: 180 tablet; Refill: 0 - pioglitazone (ACTOS) 15 MG tablet; Take 1 tablet (15 mg total) by mouth daily.  Dispense: 90 tablet; Refill: 0 - Dulaglutide (TRULICITY) 3 AT/5.5DD SOPN; Inject 3 mg as directed once a week.  Dispense: 6 mL; Refill: 1  5. Gastroesophageal reflux disease without esophagitis  Discussed trying taking it every other day  6. Chronic neck pain   7. History of lumbar discectomy  Stable, taking diclofenac daily , discussed long term risk   8. Fatty liver  Recheck levels next visit   9. Diabetes mellitus with microalbuminuria (HCC)  - glipiZIDE (GLUCOTROL XL) 5 MG 24 hr tablet; Take 1 tablet (5 mg total) by mouth daily with breakfast.  Dispense: 90 tablet; Refill: 0 - metFORMIN (GLUCOPHAGE-XR) 750 MG 24 hr tablet; Take 2 tablets (1,500 mg total) by mouth daily with breakfast.  Dispense: 180 tablet; Refill: 0 - pioglitazone (ACTOS) 15 MG tablet; Take 1 tablet (15 mg total) by mouth daily.  Dispense: 90 tablet; Refill: 0 - Dulaglutide (TRULICITY) 3 UK/0.2RK SOPN; Inject 3 mg as directed once a week.  Dispense: 6 mL; Refill: 1  10. Uvular edema  Discussed allergy symptoms, stridor and when to go to [facility] - EPINEPHrine 0.3 mg/0.3 mL IJ SOAJ injection; Inject 0.3 mg into the muscle as needed for anaphylaxis.  Dispense: 2 each; Refill: 0

## 2020-07-11 ENCOUNTER — Ambulatory Visit: Payer: 59 | Admitting: Family Medicine

## 2020-07-11 ENCOUNTER — Encounter: Payer: Self-pay | Admitting: Family Medicine

## 2020-07-11 ENCOUNTER — Other Ambulatory Visit: Payer: Self-pay

## 2020-07-11 VITALS — BP 112/72 | HR 69 | Temp 98.0°F | Resp 18 | Ht 68.0 in | Wt 254.3 lb

## 2020-07-11 DIAGNOSIS — E559 Vitamin D deficiency, unspecified: Secondary | ICD-10-CM

## 2020-07-11 DIAGNOSIS — K219 Gastro-esophageal reflux disease without esophagitis: Secondary | ICD-10-CM | POA: Diagnosis not present

## 2020-07-11 DIAGNOSIS — E1169 Type 2 diabetes mellitus with other specified complication: Secondary | ICD-10-CM

## 2020-07-11 DIAGNOSIS — E669 Obesity, unspecified: Secondary | ICD-10-CM | POA: Diagnosis not present

## 2020-07-11 DIAGNOSIS — E785 Hyperlipidemia, unspecified: Secondary | ICD-10-CM

## 2020-07-11 DIAGNOSIS — K1379 Other lesions of oral mucosa: Secondary | ICD-10-CM

## 2020-07-11 DIAGNOSIS — R809 Proteinuria, unspecified: Secondary | ICD-10-CM

## 2020-07-11 DIAGNOSIS — G8929 Other chronic pain: Secondary | ICD-10-CM

## 2020-07-11 DIAGNOSIS — K760 Fatty (change of) liver, not elsewhere classified: Secondary | ICD-10-CM

## 2020-07-11 DIAGNOSIS — Z9889 Other specified postprocedural states: Secondary | ICD-10-CM

## 2020-07-11 DIAGNOSIS — E1129 Type 2 diabetes mellitus with other diabetic kidney complication: Secondary | ICD-10-CM

## 2020-07-11 DIAGNOSIS — M542 Cervicalgia: Secondary | ICD-10-CM

## 2020-07-11 LAB — POCT GLYCOSYLATED HEMOGLOBIN (HGB A1C): Hemoglobin A1C: 7.2 % — AB (ref 4.0–5.6)

## 2020-07-11 MED ORDER — EPINEPHRINE 0.3 MG/0.3ML IJ SOAJ: 0.3000 mg | INTRAMUSCULAR | 0 refills | Status: DC | PRN

## 2020-07-11 MED ORDER — METFORMIN HCL ER 750 MG PO TB24: 1500.0000 mg | ORAL_TABLET | Freq: Every day | ORAL | 0 refills | Status: DC

## 2020-07-11 MED ORDER — GLIPIZIDE ER 5 MG PO TB24: 5.0000 mg | ORAL_TABLET | Freq: Every day | ORAL | 0 refills | Status: DC

## 2020-07-11 MED ORDER — PIOGLITAZONE HCL 15 MG PO TABS: 15.0000 mg | ORAL_TABLET | Freq: Every day | ORAL | 0 refills | Status: DC

## 2020-07-11 MED ORDER — TRULICITY 3 MG/0.5ML ~~LOC~~ SOAJ: 3.0000 mg | SUBCUTANEOUS | 1 refills | Status: DC

## 2020-08-03 LAB — HM DIABETES EYE EXAM

## 2020-08-08 ENCOUNTER — Encounter: Payer: Self-pay | Admitting: Emergency Medicine

## 2020-09-13 ENCOUNTER — Other Ambulatory Visit: Payer: Self-pay | Admitting: Family Medicine

## 2020-09-13 DIAGNOSIS — E1169 Type 2 diabetes mellitus with other specified complication: Secondary | ICD-10-CM

## 2020-09-18 ENCOUNTER — Encounter: Payer: Self-pay | Admitting: Internal Medicine

## 2020-09-18 ENCOUNTER — Ambulatory Visit: Payer: 59 | Admitting: Internal Medicine

## 2020-09-18 ENCOUNTER — Other Ambulatory Visit: Payer: Self-pay

## 2020-09-18 VITALS — BP 150/100 | HR 82 | Temp 96.8°F | Resp 16 | Ht 68.0 in | Wt 256.3 lb

## 2020-09-18 DIAGNOSIS — M25461 Effusion, right knee: Secondary | ICD-10-CM | POA: Diagnosis not present

## 2020-09-18 DIAGNOSIS — M25561 Pain in right knee: Secondary | ICD-10-CM

## 2020-09-18 NOTE — Patient Instructions (Signed)
Referral was placed to orthopedics today.  An x-ray will be done as part of their evaluation.  Continue with elevation, relative rest, utilizing a knee sleeve when up and about to help with support, liberal use of ice as discussed, and await orthopedics input.

## 2020-09-18 NOTE — Progress Notes (Signed)
Patient ID: Trevor Dennis., male    DOB: December 31, 1970, 49 y.o.   MRN: [id number]  PCP: Steele Sizer, MD  Chief Complaint  Patient presents with  . Knee Pain    Subjective:   Trevor Dennis. is a 49 y.o. male, presents to clinic with CC of the following:  Chief Complaint  Patient presents with  . Knee Pain    HPI:  Patient is a 49 year old male patient of Dr. Ancil Boozer Last visit with her was in September 2021 Follows up today with knee pain.   Patient with right knee pain He notes he was playing basketball with his grandkids before Thanksgiving, had no one-time trauma, and started to swell and have discomfort in the next days after.  He notes this happens occasionally when he overdoes it, the knee tends to swell, and he usually treats conservatively and it gets better in a few days.  He notes this time the swelling has decreased by elevating, taking diclofenac in the morning and a cream in the morning and later in the day applied, occasionally applying a cold compact.  He just thinks it still feels tight, and has not got back to normal like it typically has in the past.  He did play Frisbee golf this past weekend, did favor the leg, but was still hiking and walking around with this activity.  He also has a sleeve he wears. He was trying to transition to meloxicam from Voltaren, per Dr. Ancil Boozer previous recommendation, and notes the diclofenac tends to work better and he has continued to utilize this alternating with meloxicam intermittently in the past. Feels the discomfort when it occurs more on the lateral side, and notes it feels more tight off and then having severe pain. Wallie Renshaw makes a noise, and notes it occasionally catches, then often can have a pop and feels better motion at that point.  Occasionally feels weak and almost giving out at times. He noted today the swelling has significantly improved since this started, although still does not feel normal and still  feels tightness.  No h/o major knee injury in the past, although notes he used to have his knee drained when he was younger when it would build up with fluid. He works as a Theme park manager presently, and has for many years.  Patient with history of morbid obesity, diabetes, and GERD noted   Patient Active Problem List   Diagnosis Date Noted  . Chronic neck pain 11/17/2018  . History of lumbar discectomy 03/04/2018  . Fatigue 11/26/2017  . Hypotestosteronism 11/26/2017  . History of subarachnoid hemorrhage 09/17/2017  . Vitamin D insufficiency 08/26/2017  . GERD (gastroesophageal reflux disease) 10/25/2016  . Morbid obesity ([place]) 10/25/2016  . 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 Anxiety 01/01/2016  . Elevated liver enzymes 07/27/2015  . Skin lesion of back 07/27/2015  . Intermittent low back pain 07/27/2015  . Fatty liver 05/03/2015  . Dyslipidemia associated with type 2 diabetes mellitus ([place]) 04/24/2015  . HLD (hyperlipidemia) 04/24/2015  . Fever blister 04/24/2015      Current Outpatient Medications:  .  ALPRAZolam (XANAX) 0.5 MG tablet, Take 1 tablet (0.5 mg total) by mouth daily as needed., Disp: 30 tablet, Rfl: 0 .  Blood Glucose Monitoring Suppl (ONE TOUCH ULTRA MINI) W/DEVICE KIT, See admin instructions., Disp: , Rfl: 0 .  diclofenac (VOLTAREN) 75 MG EC tablet, TAKE 1 TABLET BY MOUTH TWICE A DAY, Disp: 60 tablet, Rfl:  0 .  Dulaglutide (TRULICITY) 3 MI/6.8EH SOPN, Inject 3 mg as directed once a week., Disp: 6 mL, Rfl: 1 .  EPINEPHrine 0.3 mg/0.3 mL IJ SOAJ injection, Inject 0.3 mg into the muscle as needed for anaphylaxis., Disp: 2 each, Rfl: 0 .  glipiZIDE (GLUCOTROL XL) 5 MG 24 hr tablet, Take 1 tablet (5 mg total) by mouth daily with breakfast., Disp: 90 tablet, Rfl: 0 .  lovastatin (MEVACOR) 40 MG tablet, TAKE 1 TABLET BY MOUTH EVERYDAY AT BEDTIME, Disp: 90 tablet, Rfl: 0 .  metFORMIN (GLUCOPHAGE-XR) 750 MG 24 hr tablet, Take 2 tablets (1,500 mg total) by  mouth daily with breakfast., Disp: 180 tablet, Rfl: 0 .  Multiple Vitamin (MULTI VITAMIN DAILY PO), Take by mouth daily. , Disp: , Rfl:  .  omeprazole (PRILOSEC) 20 MG capsule, Take 20 mg by mouth daily., Disp: , Rfl:  .  ONE TOUCH ULTRA TEST test strip, USE TO TEST SUGAR 3 TIMES A DAY, Disp: , Rfl: 12 .  pioglitazone (ACTOS) 15 MG tablet, Take 1 tablet (15 mg total) by mouth daily., Disp: 90 tablet, Rfl: 0 .  valACYclovir (VALTREX) 1000 MG tablet, Take 1 tablet (1,000 mg total) by mouth 2 (two) times daily., Disp: 20 tablet, Rfl: 0   No Known Allergies   Past Surgical History:  Procedure Laterality Date  . BACK SURGERY  8 years ago     Family History  Problem Relation Age of Onset  . Diabetes Mother   . Liver disease Brother   . Alcohol abuse Brother   . ADD / ADHD Son   . ADD / ADHD Son      Social History   Tobacco Use  . Smoking status: Current Some Day Smoker    Packs/day: 1.00    Years: 26.00    Pack years: 26.00    Types: Cigarettes, Cigars    Start date: 10/14/1989    Last attempt to quit: 06/04/2016    Years since quitting: 4.2  . Smokeless tobacco: Never Used  . Tobacco comment: he smokes cigars or occasional cigarettes   Substance Use Topics  . Alcohol use: Yes    Alcohol/week: 0.0 standard drinks    Comment: Occasional     With staff assistance, above reviewed with the patient today.  ROS: As per HPI, otherwise no specific complaints on a limited and focused system review   No results found for this or any previous visit (from the past 72 hour(s)).   PHQ2/9: Depression screen [facility] 2/9 09/18/2020 07/11/2020 04/07/2020 01/06/2020 10/04/2019  Decreased Interest 0 0 0 0 0  Down, Depressed, Hopeless 0 0 0 0 0  PHQ - 2 Score 0 0 0 0 0  Altered sleeping - 0 0 0 0  Tired, decreased energy - 0 0 0 0  Change in appetite - 0 0 0 0  Feeling bad or failure about yourself  - 0 0 0 0  Trouble concentrating - 0 0 0 0  Moving slowly or fidgety/restless - 0 0 0 0   Suicidal thoughts - 0 0 0 0  PHQ-9 Score - 0 0 0 0  Difficult doing work/chores - - - Not difficult at all -  Some recent data might be hidden   PHQ-2/9 Result is neg  Fall Risk: Fall Risk  09/18/2020 07/11/2020 04/07/2020 04/07/2020 01/06/2020  Falls in the past year? 0 0 0 0 0  Number falls in past yr: 0 0 0 0 0  Injury with Fall?  0 0 1 0 0  Follow up - - - - Falls evaluation completed      Objective:   Vitals:   09/18/20 1102  BP: (!) 150/100  Pulse: 82  Resp: 16  Temp: (!) 96.8 F (36 C)  TempSrc: Oral  SpO2: 97%  Weight: 256 lb 4.8 oz (116.3 kg)  Height: 5' 8" (1.727 m)    Body mass index is 38.97 kg/m. Recheck blood pressure by Crystal was 130/88 after resting in the office. Physical Exam   NAD, masked, pleasant, obese HEENT - [place]/AT, sclera anicteric,  Ext - no marked LE edema, no calf swelling or tenderness Right knee -limited ROM mainly in the last 30 degrees of flexion due to discomfort, could fully extend,  No marked crepitus on ROM testing  + Mild effusion, not marked  No bruising  NT with movement of the patella, no gross deformity of patella  NT suprapatella bursa region  No medial joint line tenderness,  + lateral joint line tenderness with palpation  Med and lat collaterals intact   Lachman neg  And and post drawer neg  [place]rays mildly positive testing the lateral meniscus, causing pain, with slight limitation in this testing due to limitations in motion testing  No pain posterior  Adequate strength testing with knee flexion and extension when sitting  Neuro/psychiatric - affect was not flat, appropriate with conversation  Alert     Results for orders placed or performed in visit on 08/08/20  [facility] DIABETES EYE EXAM  Result Value Ref Range   [facility] Diabetic Eye Exam No Retinopathy No Retinopathy       Assessment & Plan:    1. Acute pain of right knee 2. Swelling of joint of right knee Patient with intermittent episodes of swelling and  discomfort, usually relieved with RICE modalities, and also wearing a sleeve at times to help with support.  This most recent episode has noted some swelling persisting, and do have concerns for a meniscal component to this.  Also likely an overweight component as we discussed.  Discussed concerns with increased weight contributing as well. Discussed options and next steps with him at length, and he wanted to see orthopedics to help with best next steps and a referral placed.  Do feel that would be helpful. Will not do an x-ray today as orthopedics often does an x-ray as part of their initial evaluation, and await their input. He should continue the RICE modalities, with an emphasis on ice intermittently noted today, especially at the end of the day at 15-minute intervals.  Elevation important, and can continue to use the sleeve when up and about during the day to help with support. Can continue the diclofenac product in the short-term, take with food, and also has the topical diclofenac product to use.   - Ambulatory referral to Orthopedics  3. Morbid obesity ([place]) As noted above, increased waist often contributes to the symptoms.   Await orthopedics input presently, and I noted that [facility] often sees people fairly quickly in this area, and he was okay with this referral placed.     Towanda Malkin, MD 09/18/20 11:29 AM

## 2020-10-04 NOTE — Progress Notes (Signed)
Name: Trevor Dennis.   MRN: [id number]    DOB: 1971-09-20   Date:10/05/2020       Progress Note  Subjective  Chief Complaint  Follow up   HPI   Diabetes Type 2 :A1C January 2020 was 8%, May 2020 it was8.7%9% August, 8.23 July 2019, March it was   9.7%, 7.3 % ,  7.2 % and today is down 7 %   He is on higher dose of Trulicity, Metformin 7673 mg, we  added Actos 15 mg, he states glipizide was sent to pharmacy but he states it was not there, explained since he has intermittent hypoglycemia , he will get lower dose to take prn ( for example when he gets steroid injection) , Hedenies polyphagia, polydipsia or polyuria. He has obesity, fatty liver, dyslipidemia and ED  associated with DM.  Chronic neck/Back pain:he states feels a little worse in cold weather, he has been more active, symptoms are intermittent and stable   Fatty liver; he had [place] in 2018, his last liver enzymes had improved, better diabetes control will help fatty liver . Unchanged   Hyperlipidemia: his LDL was reviewed and at goal, LDl of 6912/2020, he is due for labs  Doing well. No myalgia.Continue medication   Morbid Obesity: his weight has been stable since last visit, continue life style modification   GERD: he is taking otc Nexium otc and is doing well  Snoring: his11yo sonstates snoring not as bad lately. His ESS was 10 back in Nov 2019 , he states still wakes up feeling tired sometimes, he is not interested in doing a sleep study. Unchanged  Right knee pain: had an effusion after playing basketball, went to [facility], diagnosed with arthritis versus meniscal tear, also found a growth on left femur and will have MRI done, also seeing Dr. Thurmond Butts for right shoulder impingement syndrome. Glucose went up when he start steroid injection but back to baseline   Patient Active Problem List   Diagnosis Date Noted  . Chronic neck pain 11/17/2018  . History of lumbar discectomy 03/04/2018  .  Fatigue 11/26/2017  . Hypotestosteronism 11/26/2017  . History of subarachnoid hemorrhage 09/17/2017  . Vitamin D insufficiency 08/26/2017  . GERD (gastroesophageal reflux disease) 10/25/2016  . Morbid obesity ([place]) 10/25/2016  . 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 Anxiety 01/01/2016  . Elevated liver enzymes 07/27/2015  . Skin lesion of back 07/27/2015  . Intermittent low back pain 07/27/2015  . Fatty liver 05/03/2015  . Dyslipidemia associated with type 2 diabetes mellitus ([place]) 04/24/2015  . HLD (hyperlipidemia) 04/24/2015  . Fever blister 04/24/2015    Past Surgical History:  Procedure Laterality Date  . BACK SURGERY  8 years ago    Family History  Problem Relation Age of Onset  . Diabetes Mother   . Liver disease Brother   . Alcohol abuse Brother   . ADD / ADHD Son   . ADD / ADHD Son     Social History   Tobacco Use  . Smoking status: Current Some Day Smoker    Packs/day: 1.00    Years: 26.00    Pack years: 26.00    Types: Cigarettes, Cigars    Start date: 10/14/1989    Last attempt to quit: 06/04/2016    Years since quitting: 4.3  . Smokeless tobacco: Never Used  . Tobacco comment: he smokes cigars or occasional cigarettes   Substance Use Topics  . Alcohol use: Yes  Alcohol/week: 0.0 standard drinks    Comment: Occasional      Current Outpatient Medications:  .  ALPRAZolam (XANAX) 0.5 MG tablet, Take 1 tablet (0.5 mg total) by mouth daily as needed., Disp: 30 tablet, Rfl: 0 .  Blood Glucose Monitoring Suppl (ONE TOUCH ULTRA MINI) W/DEVICE KIT, See admin instructions., Disp: , Rfl: 0 .  Dulaglutide (TRULICITY) 3 SN/0.5LZ SOPN, Inject 3 mg as directed once a week., Disp: 6 mL, Rfl: 1 .  EPINEPHrine 0.3 mg/0.3 mL IJ SOAJ injection, Inject 0.3 mg into the muscle as needed for anaphylaxis., Disp: 2 each, Rfl: 0 .  esomeprazole (NEXIUM) 20 MG capsule, Nexium 24HR 20 mg capsule,delayed release, Disp: , Rfl:  .  lovastatin (MEVACOR) 40 MG  tablet, TAKE 1 TABLET BY MOUTH EVERYDAY AT BEDTIME, Disp: 90 tablet, Rfl: 0 .  Multiple Vitamin (MULTI VITAMIN DAILY PO), Take by mouth daily. , Disp: , Rfl:  .  omeprazole (PRILOSEC) 20 MG capsule, Take 20 mg by mouth daily., Disp: , Rfl:  .  ONE TOUCH ULTRA TEST test strip, USE TO TEST SUGAR 3 TIMES A DAY, Disp: , Rfl: 12 .  valACYclovir (VALTREX) 1000 MG tablet, Take 1 tablet (1,000 mg total) by mouth 2 (two) times daily., Disp: 20 tablet, Rfl: 0 .  glipiZIDE (GLUCOTROL XL) 2.5 MG 24 hr tablet, Take 1 tablet (2.5 mg total) by mouth daily with breakfast., Disp: 90 tablet, Rfl: 1 .  meloxicam (MOBIC) 15 MG tablet, Take 1 tablet (15 mg total) by mouth daily., Disp: 90 tablet, Rfl: 1 .  metFORMIN (GLUCOPHAGE-XR) 750 MG 24 hr tablet, Take 2 tablets (1,500 mg total) by mouth daily with breakfast., Disp: 180 tablet, Rfl: 1 .  pioglitazone (ACTOS) 15 MG tablet, Take 1 tablet (15 mg total) by mouth daily., Disp: 90 tablet, Rfl: 1  No Known Allergies  I personally reviewed active problem list, medication list, allergies, family history, social history, health maintenance with the patient/caregiver today.   ROS  Constitutional: Negative for fever or weight change.  Respiratory: Negative for cough and shortness of breath.   Cardiovascular: Negative for chest pain or palpitations.  Gastrointestinal: Negative for abdominal pain, no bowel changes.  Musculoskeletal: Negative for gait problem , positive for intermittent right knee  joint swelling.  Skin: Negative for rash.  Neurological: Negative for dizziness or headache.  No other specific complaints in a complete review of systems (except as listed in HPI above).  Objective  Vitals:   10/05/20 0827  BP: 134/88  Pulse: 86  Resp: 16  Temp: 98.1 F (36.7 C)  TempSrc: Oral  SpO2: 99%  Weight: 255 lb 12.8 oz (116 kg)  Height: _0  (1.727 m)    Body mass index is 38.89 kg/m.  Physical Exam   Constitutional: Patient appears  well-developed and well-nourished. Obese  No distress.  HEENT: head atraumatic, normocephalic, pupils equal and reactive to light,  neck supple Cardiovascular: Normal rate, regular rhythm and normal heart sounds.  No murmur heard. No BLE edema. Pulmonary/Chest: Effort normal and breath sounds normal. No respiratory distress. Abdominal: Soft.  There is no tenderness. Muscular skeletal: minimal effusion of right knee, no increase in warmth or redness  Psychiatric: Patient has a normal mood and affect. behavior is normal. Judgment and thought content normal.  Recent Results (from the past 2160 hour(s))  POCT HgB A1C     Status: Abnormal   Collection Time: 07/11/20  8:39 AM  Result Value Ref Range   Hemoglobin A1C 7.2 (  A) 4.0 - 5.6 %   HbA1c POC (<> result, manual entry)     HbA1c, POC (prediabetic range)     HbA1c, POC (controlled diabetic range)    HM DIABETES EYE EXAM     Status: None   Collection Time: 08/03/20 12:00 AM  Result Value Ref Range   HM Diabetic Eye Exam No Retinopathy No Retinopathy  POCT HgB A1C     Status: Abnormal   Collection Time: 10/05/20  8:38 AM  Result Value Ref Range   Hemoglobin A1C 7.0 (A) 4.0 - 5.6 %   HbA1c POC (<> result, manual entry)     HbA1c, POC (prediabetic range)     HbA1c, POC (controlled diabetic range)       PHQ2/9: Depression screen [facility] 2/9 10/05/2020 09/18/2020 07/11/2020 04/07/2020 01/06/2020  Decreased Interest 0 0 0 0 0  Down, Depressed, Hopeless 0 0 0 0 0  PHQ - 2 Score 0 0 0 0 0  Altered sleeping - - 0 0 0  Tired, decreased energy - - 0 0 0  Change in appetite - - 0 0 0  Feeling bad or failure about yourself  - - 0 0 0  Trouble concentrating - - 0 0 0  Moving slowly or fidgety/restless - - 0 0 0  Suicidal thoughts - - 0 0 0  PHQ-9 Score - - 0 0 0  Difficult doing work/chores - - - - Not difficult at all  Some recent data might be hidden    phq 9 is negative   Fall Risk: Fall Risk  10/05/2020 09/18/2020 07/11/2020 04/07/2020  04/07/2020  Falls in the past year? 0 0 0 0 0  Number falls in past yr: 0 0 0 0 0  Injury with Fall? 0 0 0 1 0  Follow up - - - - -      Functional Status Survey: Is the patient deaf or have difficulty hearing?: No Does the patient have difficulty seeing, even when wearing glasses/contacts?: No Does the patient have difficulty concentrating, remembering, or making decisions?: No Does the patient have difficulty walking or climbing stairs?: No Does the patient have difficulty dressing or bathing?: No Does the patient have difficulty doing errands alone such as visiting a doctor's office or shopping?: No    Assessment & Plan  1. Diabetes mellitus type 2 in obese (HCC)  - POCT HgB A1C - glipiZIDE (GLUCOTROL XL) 2.5 MG 24 hr tablet; Take 1 tablet (2.5 mg total) by mouth daily with breakfast.  Dispense: 90 tablet; Refill: 1 - metFORMIN (GLUCOPHAGE-XR) 750 MG 24 hr tablet; Take 2 tablets (1,500 mg total) by mouth daily with breakfast.  Dispense: 180 tablet; Refill: 1 - Microalbumin / creatinine urine ratio - pioglitazone (ACTOS) 15 MG tablet; Take 1 tablet (15 mg total) by mouth daily.  Dispense: 90 tablet; Refill: 1  2. Chronic pain of right knee  - meloxicam (MOBIC) 15 MG tablet; Take 1 tablet (15 mg total) by mouth daily.  Dispense: 90 tablet; Refill: 1  3. Impingement syndrome of right shoulder  - meloxicam (MOBIC) 15 MG tablet; Take 1 tablet (15 mg total) by mouth daily.  Dispense: 90 tablet; Refill: 1  4. Dyslipidemia associated with type 2 diabetes mellitus (HCC)  - glipiZIDE (GLUCOTROL XL) 2.5 MG 24 hr tablet; Take 1 tablet (2.5 mg total) by mouth daily with breakfast.  Dispense: 90 tablet; Refill: 1 - metFORMIN (GLUCOPHAGE-XR) 750 MG 24 hr tablet; Take 2 tablets (1,500 mg total) by  mouth daily with breakfast.  Dispense: 180 tablet; Refill: 1 - Lipid panel - pioglitazone (ACTOS) 15 MG tablet; Take 1 tablet (15 mg total) by mouth daily.  Dispense: 90 tablet; Refill: 1  5.  Diabetes mellitus with microalbuminuria (HCC)  - glipiZIDE (GLUCOTROL XL) 2.5 MG 24 hr tablet; Take 1 tablet (2.5 mg total) by mouth daily with breakfast.  Dispense: 90 tablet; Refill: 1 - metFORMIN (GLUCOPHAGE-XR) 750 MG 24 hr tablet; Take 2 tablets (1,500 mg total) by mouth daily with breakfast.  Dispense: 180 tablet; Refill: 1 - pioglitazone (ACTOS) 15 MG tablet; Take 1 tablet (15 mg total) by mouth daily.  Dispense: 90 tablet; Refill: 1  6. Fatty liver   7. Gastroesophageal reflux disease without esophagitis   8. Vitamin D insufficiency  - VITAMIN D 25 Hydroxy (Vit-D Deficiency, Fractures)  9. Morbid obesity ([place])  Discussed with the patient the risk posed by an increased BMI. Discussed importance of portion control, calorie counting and at least 150 minutes of physical activity weekly. Avoid sweet beverages and drink more water. Eat at least 6 servings of fruit and vegetables daily   10. Chronic neck pain  - meloxicam (MOBIC) 15 MG tablet; Take 1 tablet (15 mg total) by mouth daily.  Dispense: 90 tablet; Refill: 1  11. Hypogonadism male   21. Long-term use of high-risk medication  - COMPLETE METABOLIC PANEL WITH GFR - CBC with Differential/Platelet - Vitamin B12

## 2020-10-05 ENCOUNTER — Encounter: Payer: Self-pay | Admitting: Family Medicine

## 2020-10-05 ENCOUNTER — Ambulatory Visit: Payer: 59 | Admitting: Family Medicine

## 2020-10-05 ENCOUNTER — Other Ambulatory Visit: Payer: Self-pay

## 2020-10-05 VITALS — BP 134/88 | HR 86 | Temp 98.1°F | Resp 16 | Ht 68.0 in | Wt 255.8 lb

## 2020-10-05 DIAGNOSIS — M25561 Pain in right knee: Secondary | ICD-10-CM

## 2020-10-05 DIAGNOSIS — E1169 Type 2 diabetes mellitus with other specified complication: Secondary | ICD-10-CM

## 2020-10-05 DIAGNOSIS — R809 Proteinuria, unspecified: Secondary | ICD-10-CM

## 2020-10-05 DIAGNOSIS — E669 Obesity, unspecified: Secondary | ICD-10-CM | POA: Diagnosis not present

## 2020-10-05 DIAGNOSIS — Z79899 Other long term (current) drug therapy: Secondary | ICD-10-CM

## 2020-10-05 DIAGNOSIS — M7541 Impingement syndrome of right shoulder: Secondary | ICD-10-CM

## 2020-10-05 DIAGNOSIS — K760 Fatty (change of) liver, not elsewhere classified: Secondary | ICD-10-CM

## 2020-10-05 DIAGNOSIS — K219 Gastro-esophageal reflux disease without esophagitis: Secondary | ICD-10-CM

## 2020-10-05 DIAGNOSIS — E291 Testicular hypofunction: Secondary | ICD-10-CM

## 2020-10-05 DIAGNOSIS — E1129 Type 2 diabetes mellitus with other diabetic kidney complication: Secondary | ICD-10-CM

## 2020-10-05 DIAGNOSIS — G8929 Other chronic pain: Secondary | ICD-10-CM

## 2020-10-05 DIAGNOSIS — M542 Cervicalgia: Secondary | ICD-10-CM

## 2020-10-05 DIAGNOSIS — E559 Vitamin D deficiency, unspecified: Secondary | ICD-10-CM

## 2020-10-05 DIAGNOSIS — E785 Hyperlipidemia, unspecified: Secondary | ICD-10-CM

## 2020-10-05 LAB — POCT GLYCOSYLATED HEMOGLOBIN (HGB A1C): Hemoglobin A1C: 7 % — AB (ref 4.0–5.6)

## 2020-10-05 MED ORDER — PIOGLITAZONE HCL 15 MG PO TABS
15.0000 mg | ORAL_TABLET | Freq: Every day | ORAL | 1 refills | Status: DC
Start: 2020-10-05 — End: 2021-06-06

## 2020-10-05 MED ORDER — MELOXICAM 15 MG PO TABS: 15.0000 mg | ORAL_TABLET | Freq: Every day | ORAL | 1 refills | Status: DC

## 2020-10-05 MED ORDER — METFORMIN HCL ER 750 MG PO TB24
1500.0000 mg | ORAL_TABLET | Freq: Every day | ORAL | 1 refills | Status: DC
Start: 2020-10-05 — End: 2021-07-26

## 2020-10-05 MED ORDER — GLIPIZIDE ER 2.5 MG PO TB24: 2.5000 mg | ORAL_TABLET | Freq: Every day | ORAL | 1 refills | Status: DC

## 2020-10-06 LAB — CBC WITH DIFFERENTIAL/PLATELET
Absolute Monocytes: 602 cells/uL (ref 200–950)
Basophils Absolute: 77 cells/uL (ref 0–200)
Basophils Relative: 0.9 %
Eosinophils Absolute: 284 cells/uL (ref 15–500)
Eosinophils Relative: 3.3 %
HCT: 42.1 % (ref 38.5–50.0)
Hemoglobin: 14 g/dL (ref 13.2–17.1)
Lymphs Abs: 2494 cells/uL (ref 850–3900)
MCH: 30.2 pg (ref 27.0–33.0)
MCHC: 33.3 g/dL (ref 32.0–36.0)
MCV: 90.9 fL (ref 80.0–100.0)
MPV: 11.8 fL (ref 7.5–12.5)
Monocytes Relative: 7 %
Neutro Abs: 5143 cells/uL (ref 1500–7800)
Neutrophils Relative %: 59.8 %
Platelets: 240 10*3/uL (ref 140–400)
RBC: 4.63 10*6/uL (ref 4.20–5.80)
RDW: 12.4 % (ref 11.0–15.0)
Total Lymphocyte: 29 %
WBC: 8.6 10*3/uL (ref 3.8–10.8)

## 2020-10-06 LAB — COMPLETE METABOLIC PANEL WITH GFR
AG Ratio: 1.6 (calc) (ref 1.0–2.5)
ALT: 90 U/L — ABNORMAL HIGH (ref 9–46)
AST: 60 U/L — ABNORMAL HIGH (ref 10–40)
Albumin: 4.4 g/dL (ref 3.6–5.1)
Alkaline phosphatase (APISO): 50 U/L (ref 36–130)
BUN: 18 mg/dL (ref 7–25)
CO2: 25 mmol/L (ref 20–32)
Calcium: 9.5 mg/dL (ref 8.6–10.3)
Chloride: 105 mmol/L (ref 98–110)
Creat: 0.91 mg/dL (ref 0.60–1.35)
GFR, Est African American: 116 mL/min/{1.73_m2} (ref >=60)
GFR, Est Non African American: 100 mL/min/{1.73_m2} (ref >=60)
Globulin: 2.7 g/dL (calc) (ref 1.9–3.7)
Glucose, Bld: 152 mg/dL — ABNORMAL HIGH (ref 65–99)
Potassium: 4.7 mmol/L (ref 3.5–5.3)
Sodium: 140 mmol/L (ref 135–146)
Total Bilirubin: 0.5 mg/dL (ref 0.2–1.2)
Total Protein: 7.1 g/dL (ref 6.1–8.1)

## 2020-10-06 LAB — VITAMIN D 25 HYDROXY (VIT D DEFICIENCY, FRACTURES): Vit D, 25-Hydroxy: 40 ng/mL (ref 30–100)

## 2020-10-06 LAB — LIPID PANEL
Cholesterol: 163 mg/dL (ref <200)
HDL: 61 mg/dL (ref >=40)
LDL Cholesterol (Calc): 79 mg/dL (calc)
Non-HDL Cholesterol (Calc): 102 mg/dL (calc) (ref <130)
Total CHOL/HDL Ratio: 2.7 (calc) (ref <5.0)
Triglycerides: 131 mg/dL (ref <150)

## 2020-10-06 LAB — VITAMIN B12: Vitamin B-12: 798 pg/mL (ref 200–1100)

## 2020-10-06 LAB — MICROALBUMIN / CREATININE URINE RATIO
Creatinine, Urine: 218 mg/dL (ref 20–320)
Microalb Creat Ratio: 38 mcg/mg creat — ABNORMAL HIGH (ref <30)
Microalb, Ur: 8.2 mg/dL

## 2020-11-27 ENCOUNTER — Other Ambulatory Visit: Payer: Self-pay

## 2020-11-27 ENCOUNTER — Ambulatory Visit (INDEPENDENT_AMBULATORY_CARE_PROVIDER_SITE_OTHER): Payer: 59 | Admitting: Family Medicine

## 2020-11-27 ENCOUNTER — Encounter: Payer: Self-pay | Admitting: Family Medicine

## 2020-11-27 VITALS — BP 132/84 | HR 92 | Temp 97.8°F | Resp 18 | Ht 68.0 in | Wt 262.1 lb

## 2020-11-27 DIAGNOSIS — E669 Obesity, unspecified: Secondary | ICD-10-CM

## 2020-11-27 DIAGNOSIS — E119 Type 2 diabetes mellitus without complications: Secondary | ICD-10-CM

## 2020-11-27 DIAGNOSIS — E1169 Type 2 diabetes mellitus with other specified complication: Secondary | ICD-10-CM

## 2020-11-27 DIAGNOSIS — R936 Abnormal findings on diagnostic imaging of limbs: Secondary | ICD-10-CM

## 2020-11-27 DIAGNOSIS — L255 Unspecified contact dermatitis due to plants, except food: Secondary | ICD-10-CM

## 2020-11-27 MED ORDER — GVOKE HYPOPEN 1-PACK 1 MG/0.2ML ~~LOC~~ SOAJ
1.0000 | Freq: Every day | SUBCUTANEOUS | 1 refills | Status: DC | PRN
Start: 2020-11-27 — End: 2021-01-11

## 2020-11-27 MED ORDER — METHYLPREDNISOLONE 4 MG PO TBPK
ORAL_TABLET | ORAL | 0 refills | Status: DC
Start: 2020-11-27 — End: 2021-01-11

## 2020-11-27 MED ORDER — TRESIBA FLEXTOUCH 100 UNIT/ML ~~LOC~~ SOPN: 10.0000 [IU] | PEN_INJECTOR | Freq: Every day | SUBCUTANEOUS | 0 refills | Status: DC

## 2020-11-27 MED ORDER — DEXAMETHASONE SODIUM PHOSPHATE 10 MG/ML IJ SOLN
10.0000 mg | Freq: Once | INTRAMUSCULAR | Status: AC
  Administered 2020-11-27: 10 mg via INTRAMUSCULAR

## 2020-11-27 NOTE — Progress Notes (Signed)
Name: Trevor Dennis.   MRN: [id number]    DOB: 1971-09-30   Date:11/27/2020       Progress Note  Subjective  Chief Complaint  Poison [place]  HPI  Poison [place]: he works outdoors, and grabbed a dead vine that was growing on the roof last week, he states the following am noticed itchy bumps with clear fluid and redness on left wrist, it is spreading. Left arm, right hand, left hand, right shoulder and right flank.   He has DM, explained that steroids will make his glucose go up, so fasting levels has been high in the 150 range, advised to hold glipizide for a few days and I will him samples of long acting insulin and also glucagon in care of hypoglycemia  Left femur mass: incidental finding when getting evaluated for right knee pain, he will see oncologist at [facility] tomorrow   Patient Active Problem List   Diagnosis Date Noted  . Chronic neck pain 11/17/2018  . History of lumbar discectomy 03/04/2018  . Fatigue 11/26/2017  . Hypotestosteronism 11/26/2017  . History of subarachnoid hemorrhage 09/17/2017  . Vitamin D insufficiency 08/26/2017  . GERD (gastroesophageal reflux disease) 10/25/2016  . Morbid obesity ([place]) 10/25/2016  . 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 Anxiety 01/01/2016  . Elevated liver enzymes 07/27/2015  . Skin lesion of back 07/27/2015  . Intermittent low back pain 07/27/2015  . Fatty liver 05/03/2015  . Dyslipidemia associated with type 2 diabetes mellitus ([place]) 04/24/2015  . HLD (hyperlipidemia) 04/24/2015  . Fever blister 04/24/2015    Past Surgical History:  Procedure Laterality Date  . BACK SURGERY  8 years ago    Family History  Problem Relation Age of Onset  . Diabetes Mother   . Liver disease Brother   . Alcohol abuse Brother   . ADD / ADHD Son   . ADD / ADHD Son     Social History   Tobacco Use  . Smoking status: Current Some Day Smoker    Packs/day: 1.00    Years: 26.00    Pack years: 26.00    Types: Cigarettes,  Cigars    Start date: 10/14/1989    Last attempt to quit: 06/04/2016    Years since quitting: 4.4  . Smokeless tobacco: Never Used  . Tobacco comment: he smokes cigars or occasional cigarettes   Substance Use Topics  . Alcohol use: Yes    Alcohol/week: 0.0 standard drinks    Comment: Occasional      Current Outpatient Medications:  .  ALPRAZolam (XANAX) 0.5 MG tablet, Take 1 tablet (0.5 mg total) by mouth daily as needed., Disp: 30 tablet, Rfl: 0 .  Blood Glucose Monitoring Suppl (ONE TOUCH ULTRA MINI) W/DEVICE KIT, See admin instructions., Disp: , Rfl: 0 .  Dulaglutide (TRULICITY) 3 QM/2.5OI SOPN, Inject 3 mg as directed once a week., Disp: 6 mL, Rfl: 1 .  EPINEPHrine 0.3 mg/0.3 mL IJ SOAJ injection, Inject 0.3 mg into the muscle as needed for anaphylaxis., Disp: 2 each, Rfl: 0 .  esomeprazole (NEXIUM) 20 MG capsule, Nexium 24HR 20 mg capsule,delayed release, Disp: , Rfl:  .  glipiZIDE (GLUCOTROL XL) 2.5 MG 24 hr tablet, Take 1 tablet (2.5 mg total) by mouth daily with breakfast., Disp: 90 tablet, Rfl: 1 .  Glucagon (GVOKE HYPOPEN 1-PACK) 1 MG/0.2ML SOAJ, Inject 1 each into the skin daily as needed. For symptomatic hypoglycemia, Disp: 0.2 mL, Rfl: 1 .  insulin degludec (TRESIBA FLEXTOUCH) 100 UNIT/ML  FlexTouch Pen, Inject 10 Units into the skin daily. To keep fasting between 100-140 fasting, Disp: 3 mL, Rfl: 0 .  lovastatin (MEVACOR) 40 MG tablet, TAKE 1 TABLET BY MOUTH EVERYDAY AT BEDTIME, Disp: 90 tablet, Rfl: 0 .  meloxicam (MOBIC) 15 MG tablet, Take 1 tablet (15 mg total) by mouth daily., Disp: 90 tablet, Rfl: 1 .  metFORMIN (GLUCOPHAGE-XR) 750 MG 24 hr tablet, Take 2 tablets (1,500 mg total) by mouth daily with breakfast., Disp: 180 tablet, Rfl: 1 .  methylPREDNISolone (MEDROL DOSEPAK) 4 MG TBPK tablet, Take as directed, Disp: 21 tablet, Rfl: 0 .  Multiple Vitamin (MULTI VITAMIN DAILY PO), Take by mouth daily. , Disp: , Rfl:  .  omeprazole (PRILOSEC) 20 MG capsule, Take 20 mg by mouth  daily., Disp: , Rfl:  .  ONE TOUCH ULTRA TEST test strip, USE TO TEST SUGAR 3 TIMES A DAY, Disp: , Rfl: 12 .  pioglitazone (ACTOS) 15 MG tablet, Take 1 tablet (15 mg total) by mouth daily., Disp: 90 tablet, Rfl: 1 .  valACYclovir (VALTREX) 1000 MG tablet, Take 1 tablet (1,000 mg total) by mouth 2 (two) times daily., Disp: 20 tablet, Rfl: 0  Current Facility-Administered Medications:  .  dexamethasone (DECADRON) injection 10 mg, 10 mg, Intramuscular, Once, Steele Sizer, MD  No Known Allergies  I personally reviewed active problem list, medication list, allergies, family history with the patient/caregiver today.   ROS  Ten systems reviewed and is negative except as mentioned in HPI   Objective  Vitals:   11/27/20 1000  BP: 132/84  Pulse: 92  Resp: 18  Temp: 97.8 F (36.6 C)  TempSrc: Oral  SpO2: 99%  Weight: 262 lb 1.6 oz (118.9 kg)  Height: '5\' 8"'  (1.727 m)    Body mass index is 39.85 kg/m.  Physical Exam  Constitutional: Patient appears well-developed and well-nourished. Obese  No distress.  HEENT: head atraumatic, normocephalic, pupils equal and reactive to light,neck supple Cardiovascular: Normal rate, regular rhythm and normal heart sounds.  No murmur heard. No BLE edema. Pulmonary/Chest: Effort normal and breath sounds normal. No respiratory distress. Abdominal: Soft.  There is no tenderness. Skin: some vesicles in streaks with erythematous base on wrists, arms, some on trunk and shoulder  Psychiatric: Patient has a normal mood and affect. behavior is normal. Judgment and thought content normal.  Recent Results (from the past 2160 hour(s))  POCT HgB A1C     Status: Abnormal   Collection Time: 10/05/20  8:38 AM  Result Value Ref Range   Hemoglobin A1C 7.0 (A) 4.0 - 5.6 %   HbA1c POC (<> result, manual entry)     HbA1c, POC (prediabetic range)     HbA1c, POC (controlled diabetic range)    Lipid panel     Status: None   Collection Time: 10/05/20  9:12 AM   Result Value Ref Range   Cholesterol 163 <200 mg/dL   HDL 61 > OR = 40 mg/dL   Triglycerides 131 <150 mg/dL   LDL Cholesterol (Calc) 79 mg/dL (calc)    Comment: Reference range: <100 . Desirable range <100 mg/dL for primary prevention;   <70 mg/dL for patients with CHD or diabetic patients  with > or = 2 CHD risk factors. Marland Kitchen LDL-C is now calculated using the Martin-Hopkins  calculation, which is a validated novel method providing  better accuracy than the Friedewald equation in the  estimation of LDL-C.  Cresenciano Genre et al. Annamaria Helling. 9678;938(10): 2061-2068  (http://education.[facility].com/faq/FAQ164)  Total CHOL/HDL Ratio 2.7 <5.0 (calc)   Non-HDL Cholesterol (Calc) 102 <130 mg/dL (calc)    Comment: For patients with diabetes plus 1 major ASCVD risk  factor, treating to a non-HDL-C goal of <100 mg/dL  (LDL-C of <70 mg/dL) is considered a therapeutic  option.   Microalbumin / creatinine urine ratio     Status: Abnormal   Collection Time: 10/05/20  9:12 AM  Result Value Ref Range   Creatinine, Urine 218 20 - 320 mg/dL   Microalb, Ur 8.2 mg/dL    Comment: Reference Range Not established    Microalb Creat Ratio 38 (H) <30 mcg/mg creat    Comment: . The ADA defines abnormalities in albumin excretion as follows: Marland Kitchen Albuminuria Category        Result (mcg/mg creatinine) . Normal to Mildly increased   <30 Moderately increased         30-299  Severely increased           > OR = 300 . The ADA recommends that at least two of three specimens collected within a 3-6 month period be abnormal before considering a patient to be within a diagnostic category.   COMPLETE METABOLIC PANEL WITH GFR     Status: Abnormal   Collection Time: 10/05/20  9:12 AM  Result Value Ref Range   Glucose, Bld 152 (H) 65 - 99 mg/dL    Comment: .            Fasting reference interval . For someone without known diabetes, a glucose value >125 mg/dL indicates that they may have diabetes and this  should be confirmed with a follow-up test. .    BUN 18 7 - 25 mg/dL   Creat 0.91 0.60 - 1.35 mg/dL   GFR, Est Non African American 100 > OR = 60 mL/min/1.36m   GFR, Est African American 116 > OR = 60 mL/min/1.771m  BUN/Creatinine Ratio NOT APPLICABLE 6 - 22 (calc)   Sodium 140 135 - 146 mmol/L   Potassium 4.7 3.5 - 5.3 mmol/L   Chloride 105 98 - 110 mmol/L   CO2 25 20 - 32 mmol/L   Calcium 9.5 8.6 - 10.3 mg/dL   Total Protein 7.1 6.1 - 8.1 g/dL   Albumin 4.4 3.6 - 5.1 g/dL   Globulin 2.7 1.9 - 3.7 g/dL (calc)   AG Ratio 1.6 1.0 - 2.5 (calc)   Total Bilirubin 0.5 0.2 - 1.2 mg/dL   Alkaline phosphatase (APISO) 50 36 - 130 U/L   AST 60 (H) 10 - 40 U/L   ALT 90 (H) 9 - 46 U/L  CBC with Differential/Platelet     Status: None   Collection Time: 10/05/20  9:12 AM  Result Value Ref Range   WBC 8.6 3.8 - 10.8 Thousand/uL   RBC 4.63 4.20 - 5.80 Million/uL   Hemoglobin 14.0 13.2 - 17.1 g/dL   HCT 42.1 38.5 - 50.0 %   MCV 90.9 80.0 - 100.0 fL   MCH 30.2 27.0 - 33.0 pg   MCHC 33.3 32.0 - 36.0 g/dL   RDW 12.4 11.0 - 15.0 %   Platelets 240 140 - 400 Thousand/uL   MPV 11.8 7.5 - 12.5 fL   Neutro Abs 5,143 1,500 - 7,800 cells/uL   Lymphs Abs 2,494 850 - 3,900 cells/uL   Absolute Monocytes 602 200 - 950 cells/uL   Eosinophils Absolute 284 15 - 500 cells/uL   Basophils Absolute 77 0 - 200 cells/uL   Neutrophils Relative %  59.8 %   Total Lymphocyte 29.0 %   Monocytes Relative 7.0 %   Eosinophils Relative 3.3 %   Basophils Relative 0.9 %  VITAMIN D 25 Hydroxy (Vit-D Deficiency, Fractures)     Status: None   Collection Time: 10/05/20  9:12 AM  Result Value Ref Range   Vit D, 25-Hydroxy 40 30 - 100 ng/mL    Comment: Vitamin D Status         25-OH Vitamin D: . Deficiency:                    <20 ng/mL Insufficiency:             20 - 29 ng/mL Optimal:                 > or = 30 ng/mL . For 25-OH Vitamin D testing on patients on  D2-supplementation and patients for whom quantitation   of D2 and D3 fractions is required, the [facility](TM) 25-OH VIT D, (D2,D3), LC/MS/MS is recommended: order  code [phone] (patients >14yr). See Note 1 . Note 1 . For additional information, please refer to  http://education.[facility].com/faq/FAQ199  (This link is being provided for informational/ educational purposes only.)   Vitamin B12     Status: None   Collection Time: 10/05/20  9:12 AM  Result Value Ref Range   Vitamin B-12 798 200 - 1,100 pg/mL     [facility]9: [facility] 11/27/2020 10/05/2020 09/18/2020 07/11/2020 04/07/2020  Decreased Interest 0 0 0 0 0  Down, Depressed, Hopeless 0 0 0 0 0  PHQ - 2 Score 0 0 0 0 0  Altered sleeping - - - 0 0  Tired, decreased energy - - - 0 0  Change in appetite - - - 0 0  Feeling bad or failure about yourself  - - - 0 0  Trouble concentrating - - - 0 0  Moving slowly or fidgety/restless - - - 0 0  Suicidal thoughts - - - 0 0  PHQ-9 Score - - - 0 0  Difficult doing work/chores - - - - -  Some recent data might be hidden    phq 9 is negative   Fall Risk: Fall Risk  11/27/2020 10/05/2020 09/18/2020 07/11/2020 04/07/2020  Falls in the past year? 0 0 0 0 0  Number falls in past yr: 0 0 0 0 0  Injury with Fall? 0 0 0 0 1  Follow up - - - - -      Assessment & Plan  1. Rhus dermatitis  - methylPREDNISolone (MEDROL DOSEPAK) 4 MG TBPK tablet; Take as directed  Dispense: 21 tablet; Refill: 0 - dexamethasone (DECADRON) injection 10 mg  2. Abnormal x-ray of femur  Keep follow up with [facility] tomorrow  3. Diabetes mellitus type 2 in obese (HCC)  - Glucagon (GVOKE HYPOPEN 1-PACK) 1 MG/0.2ML SOAJ; Inject 1 each into the skin daily as needed. For symptomatic hypoglycemia  Dispense: 0.2 mL; Refill: 1 - insulin degludec (TRESIBA FLEXTOUCH) 100 UNIT/ML FlexTouch Pen; Inject 10 Units into the skin daily. To keep fasting between 100-140 fasting  Dispense: 3 mL; Refill: 0  Hold glipizide while taking [place]

## 2020-11-27 NOTE — Patient Instructions (Signed)
Poison Ivy Dermatitis Poison ivy dermatitis is redness and soreness of the skin caused by chemicals in the leaves of the poison ivy plant. You may have very bad itching, swelling, a rash, and blisters. What are the causes?  Touching a poison ivy plant.  Touching something that has the chemical on it. This may include animals or objects that have come in contact with the plant. What increases the risk?  Going outdoors often in wooded or marshy areas.  Going outdoors without wearing protective clothing, such as closed shoes, long pants, and a long-sleeved shirt. What are the signs or symptoms?  Skin redness.  Very bad itching.  A rash that often includes bumps and blisters. ? The rash usually appears 48 hours after exposure, if you have been exposed before. ? If this is the first time you have been exposed, the rash may not appear until a week after exposure.  Swelling. This may occur if the reaction is very bad. Symptoms usually last for 1-2 weeks. The first time you develop this condition, symptoms may last 3-4 weeks.   How is this treated? This condition may be treated with:  Hydrocortisone cream or calamine lotion to relieve itching.  Oatmeal baths to soothe the skin.  Medicines, such as over-the-counter antihistamine tablets.  Oral steroid medicine for more severe reactions. Follow these instructions at home: Medicines  Take or apply over-the-counter and prescription medicines only as told by your doctor.  Use hydrocortisone cream or calamine lotion as needed to help with itching. General instructions  Do not scratch or rub your skin.  Put a cold, wet cloth (cold compress) on the affected areas or take baths in cool water. This will help with itching.  Avoid hot baths and showers.  Take oatmeal baths as needed. Use colloidal oatmeal. You can get this at a pharmacy or grocery store. Follow the instructions on the package.  While you have the rash, wash your clothes  right after you wear them.  Keep all follow-up visits as told by your health care provider. This is important. How is this prevented?  Know what poison ivy looks like, so you can avoid it. ? This plant has three leaves with flowering branches on a single stem. ? The leaves are glossy. ? The leaves have uneven edges that come to a point at the front.  If you touch poison ivy, wash your skin with soap and water right away. Be sure to wash under your fingernails.  When hiking or camping, wear long pants, a long-sleeved shirt, tall socks, and hiking boots. You can also use a lotion on your skin that helps to prevent contact with poison ivy.  If you think that your clothes or outdoor gear came in contact with poison ivy, rinse them off with a garden hose before you bring them inside your house.  When doing yard work or gardening, wear gloves, long sleeves, long pants, and boots. Wash your garden tools and gloves if they come in contact with poison ivy.  If you think that your pet has come into contact with poison ivy, wash him or her with pet shampoo and water. Make sure to wear gloves while washing your pet.   Contact a doctor if:  You have open sores in the rash area.  You have more redness, swelling, or pain in the rash area.  You have redness that spreads beyond the rash area.  You have fluid, blood, or pus coming from the rash area.  You   have a fever.  You have a rash over a large area of your body.  You have a rash on your eyes, mouth, or genitals.  Your rash does not get better after a few weeks. Get help right away if:  Your face swells or your eyes swell shut.  You have trouble breathing.  You have trouble swallowing. These symptoms may be an emergency. Do not wait to see if the symptoms will go away. Get medical help right away. Call your local emergency services (911 in the [place] Do not drive yourself to the hospital. Summary  Poison ivy dermatitis is redness and  soreness of the skin caused by chemicals in the leaves of the poison ivy plant.  You may have skin redness, very bad itching, swelling, and a rash.  Do not scratch or rub your skin.  Take or apply over-the-counter and prescription medicines only as told by your doctor. This information is not intended to replace advice given to you by your health care provider. Make sure you discuss any questions you have with your health care provider. Document Revised: 01/22/2019 Document Reviewed: 09/25/2018 Elsevier Patient Education  2021 Elsevier Inc.  

## 2020-11-29 ENCOUNTER — Ambulatory Visit: Payer: Self-pay

## 2020-11-29 NOTE — Telephone Encounter (Signed)
Pt. Reports his PCP saw area to right hip Monday at his visit. Using Ketoconazole 2%, "but it's not helping. The area has gotten bigger, it feels hot to the touch and is very red." States he took "an antibiotic for it one time and that cleared it up." Please advise pt.  Answer Assessment - Initial Assessment Questions 1. APPEARANCE of RASH: "Describe the rash."      Right hip 2. LOCATION: "Where is the rash located?"      Right hip 3. NUMBER: "How many spots are there?"      1 4. SIZE: "How big are the spots?" (Inches, centimeters or compare to size of a coin)      Size of pt.'s hand 5. ONSET: "When did the rash start?"      Friday 6. ITCHING: "Does the rash itch?" If Yes, ask: "How bad is the itch?"  (Scale 1-10; or mild, moderate, severe)     Moderate 7. PAIN: "Does the rash hurt?" If Yes, ask: "How bad is the pain?"  (Scale 1-10; or mild, moderate, severe)     Moderate 8. OTHER SYMPTOMS: "Do you have any other symptoms?" (e.g., fever)     No 9. PREGNANCY: "Is there any chance you are pregnant?" "When was your last menstrual period?"     n/a  Protocols used: RASH OR REDNESS - LOCALIZED-A-AH

## 2020-11-30 ENCOUNTER — Telehealth: Payer: Self-pay | Admitting: Family Medicine

## 2020-11-30 ENCOUNTER — Other Ambulatory Visit: Payer: Self-pay | Admitting: Family Medicine

## 2020-11-30 DIAGNOSIS — R21 Rash and other nonspecific skin eruption: Secondary | ICD-10-CM

## 2020-11-30 MED ORDER — DOXYCYCLINE HYCLATE 100 MG PO TABS: 100.0000 mg | ORAL_TABLET | Freq: Two times a day (BID) | ORAL | 0 refills | Status: DC

## 2020-11-30 NOTE — Telephone Encounter (Signed)
Spoke with patient and informed in that Dr. Carlynn Purl sent in doxycycline to [facility].

## 2020-11-30 NOTE — Telephone Encounter (Signed)
There is nurse triage message I am unable to addend. Pt is calling back to see if dr Carlynn Purl will call in some fungal medication for the rash on his hip . [facility] [address] in [place]

## 2021-01-09 ENCOUNTER — Other Ambulatory Visit: Payer: Self-pay | Admitting: Family Medicine

## 2021-01-09 DIAGNOSIS — E1169 Type 2 diabetes mellitus with other specified complication: Secondary | ICD-10-CM

## 2021-01-09 DIAGNOSIS — E7800 Pure hypercholesterolemia, unspecified: Secondary | ICD-10-CM

## 2021-01-09 NOTE — Progress Notes (Signed)
Name: Trevor Dennis.   MRN: [id number]    DOB: 17-Mar-1971   Date:01/11/2021       Progress Note  Subjective  Chief Complaint  Chief Complaint  Patient presents with  . Follow-up    HPI  Diabetes Type 2 :A1C January 2020 was 8%, May 2020 it was8.7%9% August, 8.23 July 2019, March 2021  it was9.7%, 7.3 %,  7.2 % down to 7 % and today is at gaol at 6.9 %  He is on higher dose of Trulicity, Metformin 8882 mg,  Actos 15 mg, he has not filled glipizide and even though he took prednisone for poison ivy about 6 weeks ago his A1C improved, he states that his diet has improved, eating a low carb diet. , Hedenies polyphagia, polydipsia or polyuria. He has obesity, fatty liver, dyslipidemia, microalbuminuria,  EDassociated with DM.  Chronic neck/Back pain/OA knee and elbow:he went to [facility] but out of network, he is now going to [place], he states he was told he has OA. He states he is aching all the time, he takes Tylenol prn, pain can be sharp . Discussed duloxetine   Fatty liver; he had [place] in 2018, his last liver enzymes had improved,better diabetes control will help fatty liver Unchanged   Hyperlipidemia: his LDL was reviewed and at goal, last LDL was up to 79. It was good before, he has been taking medications, we will recheck next visit   Morbid Obesity: he is on a low carb diet, lost 4 lbs since last visit   GERD: he is taking otc Nexium otc BID but when he skips the PM dose he wakes up feeling more quizy and sometimes vomits acid   Snoring: his12yo sonstates snoring not as bad lately. His ESS was 10 back in Nov 2019 , he states still wakes up feeling tired sometimes, he is not interested in doing a sleep study, he states his girlfriend is not bothered by it   Right knee pain: had an effusion after playing basketball, went to [facility], diagnosed with arthritis versus meniscal tear, also found a growth on left femur and will have MRI done, also seeing  Dr. Thurmond Butts for right shoulder impingement syndrome., had steroid injection and it seemed to help   Left femur mass: incidental finding when getting evaluated for right knee pain, he will see oncologist at [facility] and was given reassurance.   Patient Active Problem List   Diagnosis Date Noted  . Chronic neck pain 11/17/2018  . History of lumbar discectomy 03/04/2018  . Fatigue 11/26/2017  . Hypotestosteronism 11/26/2017  . History of subarachnoid hemorrhage 09/17/2017  . Vitamin D insufficiency 08/26/2017  . GERD (gastroesophageal reflux disease) 10/25/2016  . Morbid obesity ([place]) 10/25/2016  . 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 Anxiety 01/01/2016  . Elevated liver enzymes 07/27/2015  . Skin lesion of back 07/27/2015  . Intermittent low back pain 07/27/2015  . Fatty liver 05/03/2015  . Dyslipidemia associated with type 2 diabetes mellitus ([place]) 04/24/2015  . HLD (hyperlipidemia) 04/24/2015  . Fever blister 04/24/2015    Past Surgical History:  Procedure Laterality Date  . BACK SURGERY  8 years ago    Family History  Problem Relation Age of Onset  . Diabetes Mother   . Liver disease Brother   . Alcohol abuse Brother   . ADD / ADHD Son   . ADD / ADHD Son     Social History   Tobacco Use  .  Smoking status: Current Some Day Smoker    Packs/day: 0.25    Years: 26.00    Pack years: 6.50    Types: Cigars, Cigarettes    Start date: 10/14/1989    Last attempt to quit: 06/04/2016    Years since quitting: 4.6  . Smokeless tobacco: Never Used  . Tobacco comment: he smokes cigars or occasional cigarettes   Substance Use Topics  . Alcohol use: Yes    Alcohol/week: 0.0 standard drinks    Comment: Occasional      Current Outpatient Medications:  .  ALPRAZolam (XANAX) 0.5 MG tablet, Take 1 tablet (0.5 mg total) by mouth daily as needed., Disp: 30 tablet, Rfl: 0 .  Blood Glucose Monitoring Suppl (ONE TOUCH ULTRA MINI) W/DEVICE KIT, See admin  instructions., Disp: , Rfl: 0 .  Dulaglutide (TRULICITY) 3 UU/7.2ZD SOPN, Inject 3 mg as directed once a week., Disp: 6 mL, Rfl: 1 .  famotidine (PEPCID) 40 MG tablet, Take 1 tablet (40 mg total) by mouth every evening. As needed, Disp: 90 tablet, Rfl: 1 .  lisinopril (ZESTRIL) 5 MG tablet, Take 1 tablet (5 mg total) by mouth daily., Disp: 90 tablet, Rfl: 1 .  lovastatin (MEVACOR) 40 MG tablet, TAKE 1 TABLET BY MOUTH EVERYDAY AT BEDTIME, Disp: 90 tablet, Rfl: 0 .  meloxicam (MOBIC) 15 MG tablet, Take 1 tablet (15 mg total) by mouth daily., Disp: 90 tablet, Rfl: 1 .  metFORMIN (GLUCOPHAGE-XR) 750 MG 24 hr tablet, Take 2 tablets (1,500 mg total) by mouth daily with breakfast., Disp: 180 tablet, Rfl: 1 .  Multiple Vitamin (MULTI VITAMIN DAILY PO), Take by mouth daily. , Disp: , Rfl:  .  ONE TOUCH ULTRA TEST test strip, USE TO TEST SUGAR 3 TIMES A DAY, Disp: , Rfl: 12 .  pioglitazone (ACTOS) 15 MG tablet, Take 1 tablet (15 mg total) by mouth daily., Disp: 90 tablet, Rfl: 1 .  valACYclovir (VALTREX) 1000 MG tablet, Take 1 tablet (1,000 mg total) by mouth 2 (two) times daily., Disp: 20 tablet, Rfl: 0 .  EPINEPHrine 0.3 mg/0.3 mL IJ SOAJ injection, Inject 0.3 mg into the muscle as needed for anaphylaxis. (Patient not taking: Reported on 01/11/2021), Disp: 2 each, Rfl: 0 .  esomeprazole (NEXIUM) 40 MG capsule, Take 1 capsule (40 mg total) by mouth in the morning., Disp: 90 capsule, Rfl: 1  No Known Allergies  I personally reviewed active problem list, medication list, allergies, family history, social history, health maintenance, notes from last encounter with the patient/caregiver today.   ROS  Constitutional: Negative for fever or weight change.  Respiratory: Negative for cough and shortness of breath.   Cardiovascular: Negative for chest pain or palpitations.  Gastrointestinal: Negative for abdominal pain, no bowel changes.  Musculoskeletal: Negative for gait problem or joint swelling.  Skin:  Negative for rash.  Neurological: Negative for dizziness or headache.  No other specific complaints in a complete review of systems (except as listed in HPI above).  Objective  Vitals:   01/11/21 0840  BP: 132/84  Pulse: 86  Resp: 16  Temp: 98.2 F (36.8 C)  TempSrc: Oral  SpO2: 98%  Weight: 258 lb (117 kg)  Height: _0  (1.727 m)    Body mass index is 39.23 kg/m.  Physical Exam  Constitutional: Patient appears well-developed and well-nourished. Obese No distress.  HEENT: head atraumatic, normocephalic, pupils equal and reactive to light,  neck supple, throat within normal limits Cardiovascular: Normal rate, regular rhythm and normal heart sounds.  No murmur heard. No BLE edema. Pulmonary/Chest: Effort normal and breath sounds normal. No respiratory distress. Abdominal: Soft.  There is no tenderness. Psychiatric: Patient has a normal mood and affect. behavior is normal. Judgment and thought content normal.  Recent Results (from the past 2160 hour(s))  POCT HgB A1C     Status: Abnormal   Collection Time: 01/11/21  8:43 AM  Result Value Ref Range   Hemoglobin A1C 6.9 (A) 4.0 - 5.6 %   HbA1c POC (<> result, manual entry)     HbA1c, POC (prediabetic range)     HbA1c, POC (controlled diabetic range)      Diabetic Foot Exam: Diabetic Foot Exam - Simple   Simple Foot Form Diabetic Foot exam was performed with the following findings: Yes 01/11/2021 12:39 PM  Visual Inspection No deformities, no ulcerations, no other skin breakdown bilaterally: Yes Sensation Testing See comments: Yes Pulse Check Posterior Tibialis and Dorsalis pulse intact bilaterally: Yes Comments Lack of sensation of right first toe, but secondary to back surgery       PHQ2/9: Depression screen [facility] 2/9 01/11/2021 11/27/2020 10/05/2020 09/18/2020 07/11/2020  Decreased Interest 0 0 0 0 0  Down, Depressed, Hopeless 0 0 0 0 0  PHQ - 2 Score 0 0 0 0 0  Altered sleeping - - - - 0  Tired, decreased energy  - - - - 0  Change in appetite - - - - 0  Feeling bad or failure about yourself  - - - - 0  Trouble concentrating - - - - 0  Moving slowly or fidgety/restless - - - - 0  Suicidal thoughts - - - - 0  PHQ-9 Score - - - - 0  Difficult doing work/chores - - - - -  Some recent data might be hidden    phq 9 is negative   Fall Risk: Fall Risk  01/11/2021 11/27/2020 10/05/2020 09/18/2020 07/11/2020  Falls in the past year? 0 0 0 0 0  Number falls in past yr: 0 0 0 0 0  Injury with Fall? 0 0 0 0 0  Follow up - - - - -     Functional Status Survey: Is the patient deaf or have difficulty hearing?: No Does the patient have difficulty seeing, even when wearing glasses/contacts?: No Does the patient have difficulty concentrating, remembering, or making decisions?: No Does the patient have difficulty walking or climbing stairs?: No Does the patient have difficulty dressing or bathing?: No Does the patient have difficulty doing errands alone such as visiting a doctor's office or shopping?: No    Assessment & Plan   1. Diabetes mellitus type 2 in obese (HCC)  - HM Diabetes Foot Exam - POCT HgB A1C  2. Colon cancer screening  Not interested   3. Dyslipidemia associated with type 2 diabetes mellitus ([place])   4. Morbid obesity ([place])  Discussed with the patient the risk posed by an increased BMI. Discussed importance of portion control, calorie counting and at least 150 minutes of physical activity weekly. Avoid sweet beverages and drink more water. Eat at least 6 servings of fruit and vegetables daily   5. Gastroesophageal reflux disease without esophagitis  - esomeprazole (NEXIUM) 40 MG capsule; Take 1 capsule (40 mg total) by mouth in the morning.  Dispense: 90 capsule; Refill: 1 - famotidine (PEPCID) 40 MG tablet; Take 1 tablet (40 mg total) by mouth every evening. As needed  Dispense: 90 tablet; Refill: 1  6. Vitamin D insufficiency  7. Chronic neck pain  Stable, discussed  starting duloxetine to improve pain and mood, but he refused it today   8. History of lumbar discectomy   9. Chronic pain of right knee  Taking meloxicam, discussed trying to take it prn   10. Diabetes mellitus with microalbuminuria (HCC)  - lisinopril (ZESTRIL) 5 MG tablet; Take 1 tablet (5 mg total) by mouth daily.  Dispense: 90 tablet; Refill: 1

## 2021-01-11 ENCOUNTER — Other Ambulatory Visit: Payer: Self-pay

## 2021-01-11 ENCOUNTER — Encounter: Payer: Self-pay | Admitting: Family Medicine

## 2021-01-11 ENCOUNTER — Ambulatory Visit (INDEPENDENT_AMBULATORY_CARE_PROVIDER_SITE_OTHER): Payer: 59 | Admitting: Family Medicine

## 2021-01-11 VITALS — BP 132/84 | HR 86 | Temp 98.2°F | Resp 16 | Ht 68.0 in | Wt 258.0 lb

## 2021-01-11 DIAGNOSIS — M25561 Pain in right knee: Secondary | ICD-10-CM

## 2021-01-11 DIAGNOSIS — E669 Obesity, unspecified: Secondary | ICD-10-CM

## 2021-01-11 DIAGNOSIS — Z1211 Encounter for screening for malignant neoplasm of colon: Secondary | ICD-10-CM | POA: Diagnosis not present

## 2021-01-11 DIAGNOSIS — E1129 Type 2 diabetes mellitus with other diabetic kidney complication: Secondary | ICD-10-CM

## 2021-01-11 DIAGNOSIS — Z9889 Other specified postprocedural states: Secondary | ICD-10-CM

## 2021-01-11 DIAGNOSIS — E785 Hyperlipidemia, unspecified: Secondary | ICD-10-CM

## 2021-01-11 DIAGNOSIS — E1169 Type 2 diabetes mellitus with other specified complication: Secondary | ICD-10-CM | POA: Diagnosis not present

## 2021-01-11 DIAGNOSIS — K219 Gastro-esophageal reflux disease without esophagitis: Secondary | ICD-10-CM

## 2021-01-11 DIAGNOSIS — M542 Cervicalgia: Secondary | ICD-10-CM

## 2021-01-11 DIAGNOSIS — E559 Vitamin D deficiency, unspecified: Secondary | ICD-10-CM

## 2021-01-11 DIAGNOSIS — R809 Proteinuria, unspecified: Secondary | ICD-10-CM

## 2021-01-11 DIAGNOSIS — G8929 Other chronic pain: Secondary | ICD-10-CM

## 2021-01-11 LAB — POCT GLYCOSYLATED HEMOGLOBIN (HGB A1C): Hemoglobin A1C: 6.9 % — AB (ref 4.0–5.6)

## 2021-01-11 MED ORDER — FAMOTIDINE 40 MG PO TABS: 40.0000 mg | ORAL_TABLET | Freq: Every evening | ORAL | 1 refills | Status: DC

## 2021-01-11 MED ORDER — LISINOPRIL 5 MG PO TABS: 5.0000 mg | ORAL_TABLET | Freq: Every day | ORAL | 1 refills | Status: DC

## 2021-01-11 MED ORDER — ESOMEPRAZOLE MAGNESIUM 40 MG PO CPDR: 40.0000 mg | DELAYED_RELEASE_CAPSULE | Freq: Every morning | ORAL | 1 refills | Status: DC

## 2021-02-07 ENCOUNTER — Other Ambulatory Visit: Payer: Self-pay | Admitting: Family Medicine

## 2021-02-07 DIAGNOSIS — R809 Proteinuria, unspecified: Secondary | ICD-10-CM

## 2021-02-07 DIAGNOSIS — E1169 Type 2 diabetes mellitus with other specified complication: Secondary | ICD-10-CM

## 2021-02-07 DIAGNOSIS — E1129 Type 2 diabetes mellitus with other diabetic kidney complication: Secondary | ICD-10-CM

## 2021-02-07 DIAGNOSIS — E785 Hyperlipidemia, unspecified: Secondary | ICD-10-CM

## 2021-03-03 ENCOUNTER — Other Ambulatory Visit: Payer: Self-pay | Admitting: Family Medicine

## 2021-03-03 DIAGNOSIS — G8929 Other chronic pain: Secondary | ICD-10-CM

## 2021-03-03 DIAGNOSIS — M7541 Impingement syndrome of right shoulder: Secondary | ICD-10-CM

## 2021-04-07 ENCOUNTER — Other Ambulatory Visit: Payer: Self-pay | Admitting: Family Medicine

## 2021-04-07 DIAGNOSIS — E785 Hyperlipidemia, unspecified: Secondary | ICD-10-CM

## 2021-04-07 DIAGNOSIS — E1169 Type 2 diabetes mellitus with other specified complication: Secondary | ICD-10-CM

## 2021-04-07 DIAGNOSIS — E7800 Pure hypercholesterolemia, unspecified: Secondary | ICD-10-CM

## 2021-04-07 NOTE — Telephone Encounter (Signed)
Requested Prescriptions  Pending Prescriptions Disp Refills  . lovastatin (MEVACOR) 40 MG tablet [Pharmacy Med Name: LOVASTATIN 40 MG TABLET] 90 tablet 0    Sig: TAKE 1 TABLET BY MOUTH EVERYDAY AT BEDTIME     Cardiovascular:  Antilipid - Statins Passed - 04/07/2021 11:26 AM      Passed - Total Cholesterol in normal range and within 360 days    Cholesterol, Total  Date Value Ref Range Status  11/22/2015 185 100 - 199 mg/dL Final   Cholesterol  Date Value Ref Range Status  10/05/2020 163 <200 mg/dL Final         Passed - LDL in normal range and within 360 days    LDL Cholesterol (Calc)  Date Value Ref Range Status  10/05/2020 79 mg/dL (calc) Final    Comment:    Reference range: <100 . Desirable range <100 mg/dL for primary prevention;   <70 mg/dL for patients with CHD or diabetic patients  with > or = 2 CHD risk factors. Marland Kitchen LDL-C is now calculated using the Martin-Hopkins  calculation, which is a validated novel method providing  better accuracy than the Friedewald equation in the  estimation of LDL-C.  Horald Pollen et al. Lenox Ahr. 4128;786(76): 2061-2068  (http://education.[facility].com/faq/FAQ164)          Passed - HDL in normal range and within 360 days    HDL  Date Value Ref Range Status  10/05/2020 61 > OR = 40 mg/dL Final  72/06/4708 58 >62 mg/dL Final         Passed - Triglycerides in normal range and within 360 days    Triglycerides  Date Value Ref Range Status  10/05/2020 131 <150 mg/dL Final         Passed - Patient is not pregnant      Passed - Valid encounter within last 12 months    Recent Outpatient Visits          2 months ago Diabetes mellitus type 2 in obese [facility])   [facility] Alba Cory, MD   4 months ago Rhus dermatitis   [facility], Danna Hefty, MD   6 months ago Diabetes mellitus type 2 in obese [facility])   [facility] Alba Cory, MD   6 months ago Acute pain of right  knee   [facility] Welford Roche D, MD   9 months ago Diabetes mellitus type 2 in obese [facility])   [facility] Alba Cory, MD      Future Appointments            In 6 days Alba Cory, MD [facility], [facility]

## 2021-04-11 ENCOUNTER — Encounter: Payer: Self-pay | Admitting: Family Medicine

## 2021-04-11 NOTE — Telephone Encounter (Signed)
Spoke to pt and explained to him that we do not have any openings. I advised pt to do a E Visit or a Video Visit through his MyChart. Pt states he will try that to get relief today and he has an appt for a regular fu on Friday 04/13/2021

## 2021-04-12 NOTE — Progress Notes (Signed)
Name: Trevor Dennis.   MRN: [id number]    DOB: 1971/06/21   Date:04/13/2021       Progress Note  Subjective  Chief Complaint  Follow Up  HPI  Diabetes Type 2 : A1C  January 2020 was 8% , May 2020 it was 8.7% 9 % August, 8.23 July 2019 , March 2021  it was   9.7%, 7.3 % ,  7.2 %, 7 %, i6.9 % and today is 6.8 % He is on higher dose of Trulicity, Metformin 340 mg ( over the past couple of weeks)  Actos 15 mg he has been off glipizide for weeks since his glucose was dropping, and now has been 80-120 on current regiment . He  He denies  polyphagia, polydipsia or polyuria. He has obesity, fatty liver, dyslipidemia, microalbuminuria,   ED  associated with DM. He has lost weight since last visit   Rash: he has a history of rhus contact dermatitis, but this rash is different, started with one bump on his neck. It is spreading to his anterior chest, left side of neck and shoulder. It is warm but no blisters or oozing. He has been taking zyrtec D , tried benadryl, it is now affecting his sleeping . It started after he was playing disc golf and got branches of pine tree around the left side of neck    Chronic neck/Back pain/OA knee and elbow: he went to [facility] but out of network, he is now going to [place], he states he was told he has OA. He states he is aching all the time, he takes Tylenol prn, pain can be sharp . He is doing better now that it is warmer weather    Fatty liver; he had [place] in 2018, his last liver enzymes had improved, better diabetes control will help fatty liver  Unchanged    Hyperlipidemia: his LDL was reviewed and at goal, last LDL was up to 79. It was good before, he has been taking medications, we will recheck next visit    Morbid Obesity: he is on a low carb diet, lost another 14 lbs, discussed getting labs, he states he is losing weight because of portion control, avoiding carbs and not drinking sodas . Explained I worry about his weight loss.    GERD: he is taking otc  Nexium otc BID but when he skips the PM dose he wakes up feeling more quizy and sometimes vomits acid    Snoring: his 35 yo son states snoring not as bad lately. His ESS was 10 back in Nov 2019 , he states still wakes up feeling tired sometimes, he is not interested in doing a sleep study. Unchanged    Right knee pain: had an effusion after playing basketball, went to [facility], diagnosed with arthritis versus meniscal tear, also found a growth on left femur and will have MRI done, also seeing Dr. Thurmond Butts for right shoulder impingement syndrome., had steroid injection . He is doing well at this time, states warm months are better   Left femur mass: incidental finding when getting evaluated for right knee pain, he will see oncologist at [facility] and was given reassurance.   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   Anxiety 01/01/2016   Elevated liver enzymes 07/27/2015   Skin lesion of back 07/27/2015   Intermittent low back pain 07/27/2015   Fatty liver 05/03/2015   Dyslipidemia associated with type 2 diabetes mellitus ([place]) 04/24/2015   HLD (hyperlipidemia) 04/24/2015   Fever blister 04/24/2015    Past Surgical History:  Procedure Laterality Date   BACK SURGERY  8 years ago    Family History  Problem Relation Age of Onset   Diabetes Mother    Liver disease Brother    Alcohol abuse Brother    ADD / ADHD Son    ADD / ADHD Son     Social History   Tobacco Use   Smoking status: Some Days    Packs/day: 0.25    Years: 26.00    Pack years: 6.50    Types: Cigars, Cigarettes    Start date: 10/14/1989    Last attempt  to quit: 06/04/2016    Years since quitting: 4.8   Smokeless tobacco: Never   Tobacco comments:    he smokes cigars or occasional cigarettes   Substance Use Topics   Alcohol use: Yes    Alcohol/week: 0.0 standard drinks    Comment: Occasional      Current Outpatient Medications:    ALPRAZolam (XANAX) 0.5 MG tablet, Take 1 tablet (0.5 mg total) by mouth daily as needed., Disp: 30 tablet, Rfl: 0   Blood Glucose Monitoring Suppl (ONE TOUCH ULTRA MINI) W/DEVICE KIT, See admin instructions., Disp: , Rfl: 0   EPINEPHrine 0.3 mg/0.3 mL IJ SOAJ injection, Inject 0.3 mg into the muscle as needed for anaphylaxis., Disp: 2 each, Rfl: 0   esomeprazole (NEXIUM) 40 MG capsule, Take 1 capsule (40 mg total) by mouth in the morning., Disp: 90 capsule, Rfl: 1   famotidine (PEPCID) 40 MG tablet, Take 1 tablet (40 mg total) by mouth every evening. As needed, Disp: 90 tablet, Rfl: 1   lisinopril (ZESTRIL) 5 MG tablet, Take 1 tablet (5 mg total) by mouth daily., Disp: 90 tablet, Rfl: 1   lovastatin (MEVACOR) 40 MG tablet, TAKE 1 TABLET BY MOUTH EVERYDAY AT BEDTIME, Disp: 90 tablet, Rfl: 0   meloxicam (MOBIC) 15 MG tablet, Take 1 tablet (15 mg total) by mouth daily., Disp: 90 tablet, Rfl: 1   metFORMIN (GLUCOPHAGE-XR) 750 MG 24 hr tablet, Take 2 tablets (1,500 mg total) by mouth daily with breakfast. (Patient taking differently: Take 1,500 mg by mouth daily. Taking 1 daily), Disp: 180 tablet, Rfl: 1   Multiple Vitamin (MULTI VITAMIN DAILY PO), Take by mouth daily. , Disp: , Rfl:    ONE TOUCH ULTRA TEST test strip, USE TO TEST SUGAR 3 TIMES A DAY, Disp: , Rfl: 12   pioglitazone (ACTOS) 15 MG tablet, Take 1 tablet (15 mg total) by mouth daily., Disp: 90 tablet, Rfl: 1   TRULICITY 3 HL/4.5GY SOPN, INJECT 3 MG AS DIRECTED ONCE A WEEK., Disp: 6 mL, Rfl: 0   valACYclovir (VALTREX) 1000 MG tablet, Take 1 tablet (1,000 mg total) by mouth 2 (two) times daily., Disp: 20 tablet, Rfl: 0  No Known Allergies  I personally  reviewed active problem list, medication list, allergies, family history, social history, health maintenance with the patient/caregiver today.   ROS  Constitutional: Negative for fever , positive for  weight change.  Respiratory: Negative for cough and shortness of breath.   Cardiovascular: Negative for chest pain or  palpitations.  Gastrointestinal: Negative for abdominal pain, no bowel changes.  Musculoskeletal: Negative for gait problem or joint swelling.  Skin:positive  for rash.  Neurological: Negative for dizziness or headache.  No other specific complaints in a complete review of systems (except as listed in HPI above).   Objective  Vitals:   04/13/21 0833  BP: 124/78  Pulse: 89  Resp: 16  Temp: 98 F (36.7 C)  TempSrc: Oral  SpO2: 98%  Weight: 244 lb (110.7 kg)  Height: '5\' 8"'  (1.727 m)    Body mass index is 37.1 kg/m.  Physical Exam  Constitutional: Patient appears well-developed and well-nourished. Obese  No distress.  HEENT: head atraumatic, normocephalic, pupils equal and reactive to light, neck supple Cardiovascular: Normal rate, regular rhythm and normal heart sounds.  No murmur heard. No BLE edema. Pulmonary/Chest: Effort normal and breath sounds normal. No respiratory distress. Abdominal: Soft.  There is no tenderness. Skin: erythematous rash on left side of neck, upper back, left shoulder and anterior chest , increase in warmth  Psychiatric: Patient has a normal mood and affect. behavior is normal. Judgment and thought content normal.   Recent Results (from the past 2160 hour(s))  POCT HgB A1C     Status: Abnormal   Collection Time: 04/13/21  8:40 AM  Result Value Ref Range   Hemoglobin A1C 6.8 (A) 4.0 - 5.6 %   HbA1c POC (<> result, manual entry)     HbA1c, POC (prediabetic range)     HbA1c, POC (controlled diabetic range)       PHQ2/9: Depression screen [facility] 2/9 04/13/2021 01/11/2021 11/27/2020 10/05/2020 09/18/2020  Decreased Interest 0 0 0 0 0   Down, Depressed, Hopeless 0 0 0 0 0  PHQ - 2 Score 0 0 0 0 0  Altered sleeping - - - - -  Tired, decreased energy - - - - -  Change in appetite - - - - -  Feeling bad or failure about yourself  - - - - -  Trouble concentrating - - - - -  Moving slowly or fidgety/restless - - - - -  Suicidal thoughts - - - - -  PHQ-9 Score - - - - -  Difficult doing work/chores - - - - -  Some recent data might be hidden    phq 9 is negative   Fall Risk: Fall Risk  04/13/2021 01/11/2021 11/27/2020 10/05/2020 09/18/2020  Falls in the past year? 0 0 0 0 0  Number falls in past yr: 0 0 0 0 0  Injury with Fall? 0 0 0 0 0  Follow up - - - - -      Functional Status Survey: Is the patient deaf or have difficulty hearing?: No Does the patient have difficulty seeing, even when wearing glasses/contacts?: No Does the patient have difficulty concentrating, remembering, or making decisions?: No Does the patient have difficulty walking or climbing stairs?: No Does the patient have difficulty dressing or bathing?: No Does the patient have difficulty doing errands alone such as visiting a doctor's office or shopping?: No    Assessment & Plan  1. Diabetes mellitus type 2 in obese (HCC)  - POCT HgB A1C  2. Rash  - cetirizine (ZYRTEC) 10 MG tablet; Take 1 tablet (10 mg total) by mouth daily.  Dispense: 60 tablet; Refill: 1 - triamcinolone cream (KENALOG) 0.1 %; Apply 1 application topically 3 (three) times daily.  Dispense: 453.6 g; Refill: 0 - hydrOXYzine (ATARAX/VISTARIL) 10 MG tablet; Take  1 tablet (10 mg total) by mouth 3 (three) times daily as needed.  Dispense: 30 tablet; Refill: 0  He will contact me to start prednisone taper if no improvement   3. Gastroesophageal reflux disease without esophagitis   4. Chronic neck pain   5. Morbid obesity ([place])  Discussed with the patient the risk posed by an increased BMI. Discussed importance of portion control, calorie counting and at least 150  minutes of physical activity weekly. Avoid sweet beverages and drink more water. Eat at least 6 servings of fruit and vegetables daily    6. Dyslipidemia associated with type 2 diabetes mellitus ([place])   7. Vitamin D insufficiency   8. History of lumbar discectomy

## 2021-04-13 ENCOUNTER — Encounter: Payer: Self-pay | Admitting: Family Medicine

## 2021-04-13 ENCOUNTER — Other Ambulatory Visit: Payer: Self-pay

## 2021-04-13 ENCOUNTER — Ambulatory Visit (INDEPENDENT_AMBULATORY_CARE_PROVIDER_SITE_OTHER): Payer: 59 | Admitting: Family Medicine

## 2021-04-13 VITALS — BP 124/78 | HR 89 | Temp 98.0°F | Resp 16 | Ht 68.0 in | Wt 244.0 lb

## 2021-04-13 DIAGNOSIS — R21 Rash and other nonspecific skin eruption: Secondary | ICD-10-CM | POA: Diagnosis not present

## 2021-04-13 DIAGNOSIS — M542 Cervicalgia: Secondary | ICD-10-CM | POA: Diagnosis not present

## 2021-04-13 DIAGNOSIS — Z9889 Other specified postprocedural states: Secondary | ICD-10-CM

## 2021-04-13 DIAGNOSIS — K219 Gastro-esophageal reflux disease without esophagitis: Secondary | ICD-10-CM

## 2021-04-13 DIAGNOSIS — E669 Obesity, unspecified: Secondary | ICD-10-CM

## 2021-04-13 DIAGNOSIS — E785 Hyperlipidemia, unspecified: Secondary | ICD-10-CM

## 2021-04-13 DIAGNOSIS — E1169 Type 2 diabetes mellitus with other specified complication: Secondary | ICD-10-CM

## 2021-04-13 DIAGNOSIS — E559 Vitamin D deficiency, unspecified: Secondary | ICD-10-CM

## 2021-04-13 DIAGNOSIS — G8929 Other chronic pain: Secondary | ICD-10-CM

## 2021-04-13 LAB — POCT GLYCOSYLATED HEMOGLOBIN (HGB A1C): Hemoglobin A1C: 6.8 % — AB (ref 4.0–5.6)

## 2021-04-13 MED ORDER — HYDROXYZINE HCL 10 MG PO TABS: 10.0000 mg | ORAL_TABLET | Freq: Three times a day (TID) | ORAL | 0 refills | Status: DC | PRN

## 2021-04-13 MED ORDER — TRIAMCINOLONE ACETONIDE 0.1 % EX CREA: 1.0000 "application " | TOPICAL_CREAM | Freq: Three times a day (TID) | CUTANEOUS | 0 refills | Status: DC

## 2021-04-13 MED ORDER — CETIRIZINE HCL 10 MG PO TABS: 10.0000 mg | ORAL_TABLET | Freq: Every day | ORAL | 1 refills | Status: DC

## 2021-05-01 ENCOUNTER — Telehealth: Payer: Self-pay

## 2021-05-01 ENCOUNTER — Other Ambulatory Visit: Payer: Self-pay | Admitting: Family Medicine

## 2021-05-01 DIAGNOSIS — I10 Essential (primary) hypertension: Secondary | ICD-10-CM

## 2021-05-01 DIAGNOSIS — E1129 Type 2 diabetes mellitus with other diabetic kidney complication: Secondary | ICD-10-CM

## 2021-05-01 DIAGNOSIS — B001 Herpesviral vesicular dermatitis: Secondary | ICD-10-CM

## 2021-05-01 MED ORDER — VALACYCLOVIR HCL 1 G PO TABS: 1000.0000 mg | ORAL_TABLET | Freq: Two times a day (BID) | ORAL | 0 refills | Status: DC

## 2021-05-01 MED ORDER — LISINOPRIL 5 MG PO TABS: 5.0000 mg | ORAL_TABLET | Freq: Every day | ORAL | 0 refills | Status: DC

## 2021-05-01 NOTE — Telephone Encounter (Signed)
Copied from CRM [id number]. Topic: General - Inquiry >> May 01, 2021 12:42 PM Daphine Deutscher D wrote: Reason for CRM: Pt called saying he is in [facility] and forgot his Lisinopril 5 mg and would like a rx sent  to he only needs about a weeks worth.  He also the pill for fever blisters  1000 mg tablets.  He has a cold core flare up.     [facility] [address] [place]  [address]  CB#  [phone]

## 2021-05-07 ENCOUNTER — Other Ambulatory Visit: Payer: Self-pay | Admitting: Family Medicine

## 2021-05-07 ENCOUNTER — Encounter: Payer: Self-pay | Admitting: Family Medicine

## 2021-05-07 DIAGNOSIS — M25561 Pain in right knee: Secondary | ICD-10-CM

## 2021-05-07 DIAGNOSIS — G8929 Other chronic pain: Secondary | ICD-10-CM

## 2021-05-07 DIAGNOSIS — M7541 Impingement syndrome of right shoulder: Secondary | ICD-10-CM

## 2021-05-08 ENCOUNTER — Encounter: Payer: Self-pay | Admitting: Unknown Physician Specialty

## 2021-05-08 ENCOUNTER — Telehealth (INDEPENDENT_AMBULATORY_CARE_PROVIDER_SITE_OTHER): Payer: 59 | Admitting: Unknown Physician Specialty

## 2021-05-08 VITALS — Ht 68.0 in | Wt 244.0 lb

## 2021-05-08 DIAGNOSIS — U071 COVID-19: Secondary | ICD-10-CM

## 2021-05-08 MED ORDER — NIRMATRELVIR/RITONAVIR (PAXLOVID)TABLET: 3.0000 | ORAL_TABLET | Freq: Two times a day (BID) | ORAL | 0 refills | Status: AC

## 2021-05-08 NOTE — Progress Notes (Signed)
Ht 5\' 8"  (1.727 m)   Wt 244 lb (110.7 kg)   BMI 37.10 kg/m    Subjective:    Patient ID: ., male    DOB: Nov 22, 1970, 50 y.o.   MRN: [id number]  HPI: Trevor Dennis. is a 50 y.o. male  Chief Complaint  Patient presents with   Covid Positive    At home tested positive Saturday.  Symptoms include; fever, body aches, hot cold sweats and headache   Due to the catastrophic nature of the COVID-19 pandemic, this visit was completed via audio and visual contact via [facility] due to the restrictions of the COVID-19 pandemic. All issues as above were discussed and addressed. Physical exam was done as above through visual confirmation on [facility] If it was felt that the patient should be evaluated in the office, they were directed there. The patient verbally consented to this visit. Location of the patient: home Location of the provider: work Those involved with this call:  Time spent on call: 20 minutes with review of chart I verified patient identity using two factors (patient name and date of birth). Patient consents verbally to being seen via telemedicine visit today.  URI  This is a new problem. Episode onset: Day 4 of symptoms. The problem has been gradually improving. The maximum temperature recorded prior to his arrival was 103 - 104 F. Associated symptoms include congestion, diarrhea, headaches and rhinorrhea. Pertinent negatives include no coughing, nausea, rash, sinus pain, sore throat or vomiting. He has tried acetaminophen and decongestant for the symptoms. The treatment provided moderate relief.    Relevant past medical, surgical, family and social history reviewed and updated as indicated. Interim medical history since our last visit reviewed. Allergies and medications reviewed and updated.  Review of Systems  HENT:  Positive for congestion and rhinorrhea. Negative for sinus pain and sore throat.   Respiratory:  Negative for cough.   Gastrointestinal:   Positive for diarrhea. Negative for nausea and vomiting.  Skin:  Negative for rash.  Neurological:  Positive for headaches.   Per HPI unless specifically indicated above     Objective:    Ht 5\' 8"  (1.727 m)   Wt 244 lb (110.7 kg)   BMI 37.10 kg/m   Wt Readings from Last 3 Encounters:  05/08/21 244 lb (110.7 kg)  04/13/21 244 lb (110.7 kg)  01/11/21 258 lb (117 kg)    Physical Exam Constitutional:      General: He is not in acute distress.    Appearance: Normal appearance. He is well-developed.  HENT:     Head: Normocephalic and atraumatic.  Eyes:     General: Lids are normal. No scleral icterus.       Right eye: No discharge.        Left eye: No discharge.     Conjunctiva/sclera: Conjunctivae normal.  Cardiovascular:     Rate and Rhythm: Normal rate.  Pulmonary:     Effort: Pulmonary effort is normal.  Abdominal:     Palpations: There is no hepatomegaly or splenomegaly.  Musculoskeletal:        General: Normal range of motion.  Skin:    Coloration: Skin is not pale.     Findings: No rash.  Neurological:     Mental Status: He is alert and oriented to person, place, and time.  Psychiatric:        Behavior: Behavior normal.        Thought Content: Thought content normal.  Judgment: Judgment normal.    Results for orders placed or performed in visit on 04/13/21  POCT HgB A1C  Result Value Ref Range   Hemoglobin A1C 6.8 (A) 4.0 - 5.6 %   HbA1c POC (<> result, manual entry)     HbA1c, POC (prediabetic range)     HbA1c, POC (controlled diabetic range)        Assessment & Plan:   Problem List Items Addressed This Visit   None Visit Diagnoses     COVID-19    -  Primary   Rx for Paxlovide, Vit C, Vit D, Zinc, Elderberry.  Stop Lovastatin until 24 hours after last dose   Relevant Medications   nirmatrelvir/ritonavir EUA (PAXLOVID) TABS        Follow up plan: Return if symptoms worsen or fail to improve.

## 2021-05-20 ENCOUNTER — Other Ambulatory Visit: Payer: Self-pay | Admitting: Family Medicine

## 2021-05-20 DIAGNOSIS — E1129 Type 2 diabetes mellitus with other diabetic kidney complication: Secondary | ICD-10-CM

## 2021-05-20 DIAGNOSIS — E1169 Type 2 diabetes mellitus with other specified complication: Secondary | ICD-10-CM

## 2021-06-06 ENCOUNTER — Other Ambulatory Visit: Payer: Self-pay | Admitting: Family Medicine

## 2021-06-06 DIAGNOSIS — E1169 Type 2 diabetes mellitus with other specified complication: Secondary | ICD-10-CM

## 2021-06-06 DIAGNOSIS — R809 Proteinuria, unspecified: Secondary | ICD-10-CM

## 2021-06-06 DIAGNOSIS — E1129 Type 2 diabetes mellitus with other diabetic kidney complication: Secondary | ICD-10-CM

## 2021-06-06 NOTE — Telephone Encounter (Signed)
Requested Prescriptions  Pending Prescriptions Disp Refills  . pioglitazone (ACTOS) 15 MG tablet [Pharmacy Med Name: PIOGLITAZONE HCL 15 MG TABLET] 90 tablet 1    Sig: TAKE 1 TABLET (15 MG TOTAL) BY MOUTH DAILY.     Endocrinology:  Diabetes - Glitazones - pioglitazone Passed - 06/06/2021  5:47 PM      Passed - HBA1C is between 0 and 7.9 and within 180 days    Hemoglobin A1C  Date Value Ref Range Status  04/13/2021 6.8 (A) 4.0 - 5.6 % Final  07/19/2019 8.9  Final   Hgb A1c MFr Bld  Date Value Ref Range Status  10/05/2019 8.7 (H) <5.7 % of total Hgb Final    Comment:    For someone without known diabetes, a hemoglobin A1c value of 6.5% or greater indicates that they may have  diabetes and this should be confirmed with a follow-up  test. . For someone with known diabetes, a value <7% indicates  that their diabetes is well controlled and a value  greater than or equal to 7% indicates suboptimal  control. A1c targets should be individualized based on  duration of diabetes, age, comorbid conditions, and  other considerations. . Currently, no consensus exists regarding use of hemoglobin A1c for diagnosis of diabetes for children. Verna Czech - Valid encounter within last 6 months    Recent Outpatient Visits          4 weeks ago COVID-19   [facility] [place], [place], NP   1 month ago Diabetes mellitus type 2 in obese [facility])   [facility] Alba Cory, MD   4 months ago Diabetes mellitus type 2 in obese [facility])   [facility] Alba Cory, MD   6 months ago Rhus dermatitis   [facility] [place] [place], Danna Hefty, MD   8 months ago Diabetes mellitus type 2 in obese [facility])   [facility] Alba Cory, MD      Future Appointments            In 1 month Carlynn Purl, Danna Hefty, MD [facility], [facility]

## 2021-07-01 ENCOUNTER — Other Ambulatory Visit: Payer: Self-pay | Admitting: Family Medicine

## 2021-07-01 DIAGNOSIS — K219 Gastro-esophageal reflux disease without esophagitis: Secondary | ICD-10-CM

## 2021-07-04 ENCOUNTER — Other Ambulatory Visit: Payer: Self-pay | Admitting: Family Medicine

## 2021-07-04 DIAGNOSIS — K219 Gastro-esophageal reflux disease without esophagitis: Secondary | ICD-10-CM

## 2021-07-25 NOTE — Progress Notes (Signed)
Name: Trevor Dennis.   MRN: [id number]    DOB: 12/25/1970   Date:07/26/2021       Progress Note  Subjective  Chief Complaint  Follow Up  HPI  Diabetes Type 2 : A1C  January 2020 was 8% , May 2020 it was 8.7% 9 % August, 8.23 July 2019 , March 2021  it was   9.7%, 7.3 % ,  7.2 %, 7 %, 6.9 % ,6.8 % and today is 6.9 %  He is on higher dose of Trulicity, Metformin 283 mg , Actos 15 mg.He denies  polyphagia, polydipsia or polyuria. He has obesity, fatty liver, dyslipidemia, microalbuminuria.  ED  associated with DM. Discussed trying to switch from Trulicity to [facility] to see if A1C will go down more and help with weight loss.    Chronic neck/Back pain/OA knee and elbow: he went to [facility] but out of network, he is now going to [place], he states he was told he has OA. He states he is aching all the time, he takes Tylenol prn, pain can be sharp . Worse in the cold months , he has been aching more now    Fatty liver; he had [place] in 2018, his last liver enzymes had improved, better diabetes control will help fatty liver  Stable.    Hyperlipidemia: his LDL was reviewed and at goal, last LDL was up to 79. It was good before, he has been taking medications, we will recheck next visit    Morbid Obesity: his diet is not restrictive lately, quit smoking two months ago, he gained a few pounds since last visit.    GERD: he is taking Nexium in am's , he had one episode of vomiting in the morning due to eating late at night. Discussed taking Pepcid prn if he eats spicy, red sauce or late at night to prevent symptoms.    Snoring: his 14 yo son states snoring not as bad lately. His ESS was 10 back in Nov 2019, he states still wakes up feeling tired sometimes, he is not interested in doing a sleep study. He is still trying to lose more weight.   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   Anxiety 01/01/2016   Elevated liver enzymes 07/27/2015   Skin lesion of back 07/27/2015   Intermittent low back pain 07/27/2015   Fatty liver 05/03/2015   Dyslipidemia associated with type 2 diabetes mellitus ([place]) 04/24/2015   HLD (hyperlipidemia) 04/24/2015   Fever blister 04/24/2015    Past Surgical History:  Procedure Laterality Date   BACK SURGERY  8 years ago    Family History  Problem Relation Age of Onset   Diabetes Mother    Liver disease Brother    Alcohol abuse Brother    ADD / ADHD Son    ADD / ADHD Son     Social History   Tobacco Use   Smoking status: Some Days    Packs/day: 0.25    Years: 26.00  Pack years: 6.50    Types: Cigars, Cigarettes    Start date: 10/14/1989    Last attempt to quit: 06/04/2016    Years since quitting: 5.1   Smokeless tobacco: Never   Tobacco comments:    he smokes cigars or occasional cigarettes   Substance Use Topics   Alcohol use: Yes    Alcohol/week: 0.0 standard drinks    Comment: Occasional      Current Outpatient Medications:    ALPRAZolam (XANAX) 0.5 MG tablet, Take 1 tablet (0.5 mg total) by mouth daily as needed., Disp: 30 tablet, Rfl: 0   Blood Glucose Monitoring Suppl (ONE TOUCH ULTRA MINI) W/DEVICE KIT, See admin instructions., Disp: , Rfl: 0   cetirizine (ZYRTEC) 10 MG tablet, Take 1 tablet (10 mg total) by mouth daily., Disp: 60 tablet, Rfl: 1   esomeprazole (NEXIUM) 40 MG capsule, TAKE 1 CAPSULE (40 MG TOTAL) BY MOUTH IN THE MORNING., Disp: 90 capsule, Rfl: 1   lisinopril (ZESTRIL) 5 MG tablet, TAKE 1 TABLET (5 MG  TOTAL) BY MOUTH DAILY., Disp: 90 tablet, Rfl: 1   lovastatin (MEVACOR) 40 MG tablet, TAKE 1 TABLET BY MOUTH EVERYDAY AT BEDTIME, Disp: 90 tablet, Rfl: 0   meloxicam (MOBIC) 15 MG tablet, TAKE 1 TABLET (15 MG TOTAL) BY MOUTH DAILY., Disp: 90 tablet, Rfl: 1   metFORMIN (GLUCOPHAGE-XR) 750 MG 24 hr tablet, Take 2 tablets (1,500 mg total) by mouth daily with breakfast. (Patient taking differently: Take 1,500 mg by mouth daily. Taking 1 daily), Disp: 180 tablet, Rfl: 1   Multiple Vitamin (MULTI VITAMIN DAILY PO), Take by mouth daily. , Disp: , Rfl:    ONE TOUCH ULTRA TEST test strip, USE TO TEST SUGAR 3 TIMES A DAY, Disp: , Rfl: 12   pioglitazone (ACTOS) 15 MG tablet, TAKE 1 TABLET (15 MG TOTAL) BY MOUTH DAILY., Disp: 90 tablet, Rfl: 1   triamcinolone cream (KENALOG) 0.1 %, Apply 1 application topically 3 (three) times daily., Disp: 748.2 g, Rfl: 0   TRULICITY 3 LM/7.8ML SOPN, INJECT 3 MG AS DIRECTED ONCE A WEEK., Disp: 6 mL, Rfl: 0   valACYclovir (VALTREX) 1000 MG tablet, Take 1 tablet (1,000 mg total) by mouth 2 (two) times daily., Disp: 20 tablet, Rfl: 0   EPINEPHrine 0.3 mg/0.3 mL IJ SOAJ injection, Inject 0.3 mg into the muscle as needed for anaphylaxis. (Patient not taking: Reported on 07/26/2021), Disp: 2 each, Rfl: 0   famotidine (PEPCID) 40 MG tablet, TAKE 1 TABLET (40 MG TOTAL) BY MOUTH EVERY EVENING. AS NEEDED (Patient not taking: Reported on 07/26/2021), Disp: 90 tablet, Rfl: 0   hydrOXYzine (ATARAX/VISTARIL) 10 MG tablet, Take 1 tablet (10 mg total) by mouth 3 (three) times daily as needed. (Patient not taking: Reported on 07/26/2021), Disp: 30 tablet, Rfl: 0   lisinopril (ZESTRIL) 5 MG tablet, Take 1 tablet (5 mg total) by mouth daily. (Patient not taking: Reported on 07/26/2021), Disp: 30 tablet, Rfl: 0  No Known Allergies  I personally reviewed active problem list, medication list, allergies, family history, social history, health maintenance with the patient/caregiver  today.   ROS  Constitutional: Negative for fever or weight change.  Respiratory: Negative for cough and shortness of breath.   Cardiovascular: Negative for chest pain or palpitations.  Gastrointestinal: Negative for abdominal pain, no bowel changes.  Musculoskeletal: Negative for gait problem or joint swelling.  Skin: Negative for rash.  Neurological: Negative for dizziness or headache.  No other specific complaints in a complete review of systems (  except as listed in HPI above).   Objective  Vitals:   07/26/21 0814  BP: 128/82  Pulse: 85  Resp: 16  Temp: 98.1 F (36.7 C)  SpO2: 97%  Weight: 247 lb (112 kg)  Height: _0  (1.727 m)    Body mass index is 37.56 kg/m.  Physical Exam  Constitutional: Patient appears well-developed and well-nourished. Obese  No distress.  HEENT: head atraumatic, normocephalic, pupils equal and reactive to light, neck supple Cardiovascular: Normal rate, regular rhythm and normal heart sounds.  No murmur heard. No BLE edema. Pulmonary/Chest: Effort normal and breath sounds normal. No respiratory distress. Abdominal: Soft.  There is no tenderness. Psychiatric: Patient has a normal mood and affect. behavior is normal. Judgment and thought content normal.   PHQ2/9: Depression screen [facility] 2/9 07/26/2021 05/08/2021 04/13/2021 01/11/2021 11/27/2020  Decreased Interest 0 0 0 0 0  Down, Depressed, Hopeless 0 0 0 0 0  PHQ - 2 Score 0 0 0 0 0  Altered sleeping - 0 - - -  Tired, decreased energy - 0 - - -  Change in appetite - 0 - - -  Feeling bad or failure about yourself  - 0 - - -  Trouble concentrating - 0 - - -  Moving slowly or fidgety/restless - 0 - - -  Suicidal thoughts - 0 - - -  PHQ-9 Score - 0 - - -  Difficult doing work/chores - Not difficult at all - - -  Some recent data might be hidden    phq 9 is negative   Fall Risk: Fall Risk  07/26/2021 05/08/2021 04/13/2021 01/11/2021 11/27/2020  Falls in the past year? 0 0 0 0 0  Number falls in  past yr: 0 - 0 0 0  Injury with Fall? 0 0 0 0 0  Risk for fall due to : No Fall Risks - - - -  Follow up Falls prevention discussed - - - -      Functional Status Survey: Is the patient deaf or have difficulty hearing?: No Does the patient have difficulty seeing, even when wearing glasses/contacts?: No Does the patient have difficulty concentrating, remembering, or making decisions?: No Does the patient have difficulty walking or climbing stairs?: No Does the patient have difficulty dressing or bathing?: No Does the patient have difficulty doing errands alone such as visiting a doctor's office or shopping?: No    Assessment & Plan  1. Diabetes mellitus type 2 in obese (HCC)  - POCT HgB A1C - metFORMIN (GLUCOPHAGE-XR) 750 MG 24 hr tablet; Take 1 tablet (750 mg total) by mouth daily with breakfast.  Dispense: 90 tablet; Refill: 0 - tirzepatide (MOUNJARO) 10 MG/0.5ML Pen; Inject 10 mg into the skin once a week. In place of Trulicity for DM  Dispense: 6 mL; Refill: 0  2. Dyslipidemia associated with type 2 diabetes mellitus (HCC)  - lovastatin (MEVACOR) 40 MG tablet; Take 1 tablet (40 mg total) by mouth at bedtime.  Dispense: 90 tablet; Refill: 0 - metFORMIN (GLUCOPHAGE-XR) 750 MG 24 hr tablet; Take 1 tablet (750 mg total) by mouth daily with breakfast.  Dispense: 90 tablet; Refill: 0  3. Pure hypercholesterolemia  - lovastatin (MEVACOR) 40 MG tablet; Take 1 tablet (40 mg total) by mouth at bedtime.  Dispense: 90 tablet; Refill: 0  4. Diabetes mellitus with microalbuminuria (HCC)  - metFORMIN (GLUCOPHAGE-XR) 750 MG 24 hr tablet; Take 1 tablet (750 mg total) by mouth daily with breakfast.  Dispense: 90 tablet; Refill:  0 - tirzepatide (MOUNJARO) 10 MG/0.5ML Pen; Inject 10 mg into the skin once a week. In place of Trulicity for DM  Dispense: 6 mL; Refill: 0  5. Morbid obesity ([place])  Discussed with the patient the risk posed by an increased BMI. Discussed importance of portion  control, calorie counting and at least 150 minutes of physical activity weekly. Avoid sweet beverages and drink more water. Eat at least 6 servings of fruit and vegetables daily    6. Vitamin D insufficiency   7. Gastroesophageal reflux disease without esophagitis   8. HTN (hypertension), benign   9. History of lumbar discectomy   10. Osteoarthritis, generalized

## 2021-07-26 ENCOUNTER — Ambulatory Visit (INDEPENDENT_AMBULATORY_CARE_PROVIDER_SITE_OTHER): Payer: 59 | Admitting: Family Medicine

## 2021-07-26 ENCOUNTER — Encounter: Payer: Self-pay | Admitting: Family Medicine

## 2021-07-26 ENCOUNTER — Other Ambulatory Visit: Payer: Self-pay

## 2021-07-26 VITALS — BP 128/82 | HR 85 | Temp 98.1°F | Resp 16 | Ht 68.0 in | Wt 247.0 lb

## 2021-07-26 DIAGNOSIS — E1169 Type 2 diabetes mellitus with other specified complication: Secondary | ICD-10-CM

## 2021-07-26 DIAGNOSIS — E669 Obesity, unspecified: Secondary | ICD-10-CM | POA: Diagnosis not present

## 2021-07-26 DIAGNOSIS — K219 Gastro-esophageal reflux disease without esophagitis: Secondary | ICD-10-CM

## 2021-07-26 DIAGNOSIS — Z9889 Other specified postprocedural states: Secondary | ICD-10-CM

## 2021-07-26 DIAGNOSIS — E1129 Type 2 diabetes mellitus with other diabetic kidney complication: Secondary | ICD-10-CM

## 2021-07-26 DIAGNOSIS — I10 Essential (primary) hypertension: Secondary | ICD-10-CM | POA: Insufficient documentation

## 2021-07-26 DIAGNOSIS — E559 Vitamin D deficiency, unspecified: Secondary | ICD-10-CM

## 2021-07-26 DIAGNOSIS — R809 Proteinuria, unspecified: Secondary | ICD-10-CM

## 2021-07-26 DIAGNOSIS — E7800 Pure hypercholesterolemia, unspecified: Secondary | ICD-10-CM

## 2021-07-26 DIAGNOSIS — M159 Polyosteoarthritis, unspecified: Secondary | ICD-10-CM

## 2021-07-26 DIAGNOSIS — E785 Hyperlipidemia, unspecified: Secondary | ICD-10-CM

## 2021-07-26 LAB — POCT GLYCOSYLATED HEMOGLOBIN (HGB A1C): Hemoglobin A1C: 6.9 % — AB (ref 4.0–5.6)

## 2021-07-26 MED ORDER — LOVASTATIN 40 MG PO TABS: 40.0000 mg | ORAL_TABLET | Freq: Every day | ORAL | 0 refills | Status: DC

## 2021-07-26 MED ORDER — TIRZEPATIDE 10 MG/0.5ML ~~LOC~~ SOAJ
10.0000 mg | SUBCUTANEOUS | 0 refills | Status: DC
Start: 2021-07-26 — End: 2021-10-31

## 2021-07-26 MED ORDER — METFORMIN HCL ER 750 MG PO TB24: 750.0000 mg | ORAL_TABLET | Freq: Every day | ORAL | 0 refills | Status: DC

## 2021-07-26 NOTE — Patient Instructions (Signed)
[place]

## 2021-07-29 ENCOUNTER — Other Ambulatory Visit: Payer: Self-pay | Admitting: Family Medicine

## 2021-07-29 DIAGNOSIS — R21 Rash and other nonspecific skin eruption: Secondary | ICD-10-CM

## 2021-07-29 NOTE — Telephone Encounter (Signed)
Requested Prescriptions  Pending Prescriptions Disp Refills  . cetirizine (ZYRTEC) 10 MG tablet [Pharmacy Med Name: CETIRIZINE HCL 10 MG TABLET] 60 tablet 1    Sig: TAKE 1 TABLET BY MOUTH EVERY DAY     Ear, Nose, and Throat:  Antihistamines Passed - 07/29/2021 10:32 AM      Passed - Valid encounter within last 12 months    Recent Outpatient Visits          3 days ago Diabetes mellitus type 2 in obese [facility])   [facility] Alba Cory, MD   2 months ago COVID-19   [facility] [place], [place], [place]   3 months ago Diabetes mellitus type 2 in obese [facility])   [facility] Alba Cory, MD   6 months ago Diabetes mellitus type 2 in obese [facility])   [facility] Alba Cory, MD   8 months ago Rhus dermatitis   [facility] Alba Cory, MD      Future Appointments            In 3 months Carlynn Purl, Danna Hefty, MD [facility], [facility]

## 2021-09-29 ENCOUNTER — Other Ambulatory Visit: Payer: Self-pay | Admitting: Family Medicine

## 2021-09-29 DIAGNOSIS — K219 Gastro-esophageal reflux disease without esophagitis: Secondary | ICD-10-CM

## 2021-09-30 NOTE — Telephone Encounter (Signed)
Requested Prescriptions  Pending Prescriptions Disp Refills   famotidine (PEPCID) 40 MG tablet [Pharmacy Med Name: FAMOTIDINE 40 MG TABLET] 90 tablet 0    Sig: TAKE 1 TABLET (40 MG TOTAL) BY MOUTH EVERY EVENING. AS NEEDED     Gastroenterology:  H2 Antagonists Passed - 09/29/2021  9:52 AM      Passed - Valid encounter within last 12 months    Recent Outpatient Visits          2 months ago Diabetes mellitus type 2 in obese [facility] [place])   [facility] Alba Cory, MD   4 months ago COVID-19   [facility] [place], NP   5 months ago Diabetes mellitus type 2 in obese [facility])   [facility] Alba Cory, MD   8 months ago Diabetes mellitus type 2 in obese [facility])   [facility] Alba Cory, MD   10 months ago Rhus dermatitis   [facility] Alba Cory, MD      Future Appointments            In 1 month Carlynn Purl, Danna Hefty, MD [facility], [facility]

## 2021-10-01 ENCOUNTER — Other Ambulatory Visit: Payer: Self-pay | Admitting: Family Medicine

## 2021-10-01 DIAGNOSIS — E1129 Type 2 diabetes mellitus with other diabetic kidney complication: Secondary | ICD-10-CM

## 2021-10-01 DIAGNOSIS — E785 Hyperlipidemia, unspecified: Secondary | ICD-10-CM

## 2021-10-01 DIAGNOSIS — E1169 Type 2 diabetes mellitus with other specified complication: Secondary | ICD-10-CM

## 2021-10-01 DIAGNOSIS — R809 Proteinuria, unspecified: Secondary | ICD-10-CM

## 2021-10-01 DIAGNOSIS — E669 Obesity, unspecified: Secondary | ICD-10-CM

## 2021-10-30 NOTE — Progress Notes (Signed)
Name: Trevor Dennis.   MRN: [id number]    DOB: 02-03-1971   Date:10/31/2021       Progress Note  Subjective  Chief Complaint  Follow Up  HPI  Diabetes Type 2 : A1C  January 2020 was 8%, A1C has gone as high at 9.7%, but has been better controlled lately. Today is 7.1%. He has been out of [facility] for the past few weeks. He states it worked better than Trulicity curbing his appetite and headaches not as often as it was while on Trulicity. Still on Metformin 750 mg , Actos 15 mg.He denies  polyphagia, polydipsia or polyuria. He has obesity, fatty liver, dyslipidemia, microalbuminuria.  ED  associated with DM. We will try PA but if unable to get it we will resume Trulicity at 1.5 for one month and bump it to 3 mg after that to re-adjust . He also takes lisinopril, for microalbuminuria and lovastatin for dyslipidemia   Chronic neck/Back pain/OA knee and elbow: he went to [facility] but out of network, he is now going to [place], he states he was told he has OA. He states he is aching all the time, he takes Tylenol prn, pain can be sharp . More pain since it has been cold outside    Fatty liver; he had [place] in 2018, his last liver enzymes had improved, better diabetes control will help fatty liver  Stable. We will recheck labs    Hyperlipidemia: his LDL was reviewed and at goal, last LDL was up to 79. It was good before, he has been taking medications but forgets to take it at times since it is the only medication he takes at night, advised him to switch to the morning to increase compliance.    Morbid Obesity: he quit smoking last Fall , he splurged during the holidays but is still eating healthy. Weight is up a little but out of GLP-1 agonist for the past few weeks.  GERD: . Controlled now, usually PPI in am, only takes H2 blocker prn at night.    Snoring: his 42 yo son states snoring not as bad lately. His ESS was 10 back in Nov 2019, he states still wakes up feeling tired sometimes, he is  not interested in doing a sleep study, due to cost. 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   Patient Active Problem List   Diagnosis Date Noted   Osteoarthritis, generalized 07/26/2021   HTN (hypertension), benign 07/26/2021   Chronic neck pain 11/17/2018   History of lumbar discectomy 03/04/2018   Fatigue 11/26/2017   Hypotestosteronism 11/26/2017   History of subarachnoid hemorrhage 09/17/2017   Vitamin D insufficiency 08/26/2017   GERD (gastroesophageal reflux disease) 10/25/2016   Morbid obesity ([place]) 10/25/2016   ED (erectile dysfunction) of organic origin 02/20/2016   Anxiety 01/01/2016   Elevated liver enzymes 07/27/2015   Skin lesion of back 07/27/2015   Intermittent low back pain 07/27/2015   Fatty liver 05/03/2015   Dyslipidemia associated with type 2 diabetes mellitus ([place]) 04/24/2015   HLD (hyperlipidemia) 04/24/2015   Fever blister 04/24/2015    Past Surgical History:  Procedure Laterality Date   BACK SURGERY  8 years ago    Family History  Problem Relation  Age of Onset   Diabetes Mother    Liver disease Brother    Alcohol abuse Brother    ADD / ADHD Son    ADD / ADHD Son     Social History   Tobacco Use   Smoking status: Former    Packs/day: 0.25    Types: Cigarettes    Start date: 10/14/1989    Quit date: 05/15/2021    Years since quitting: 0.4   Smokeless tobacco: Never   Tobacco comments:    Only smoking cigars occasionally  Substance Use Topics   Alcohol use: Yes    Alcohol/week: 0.0 standard drinks    Comment: Occasional      Current Outpatient Medications:    ALPRAZolam (XANAX) 0.5 MG tablet, Take 1 tablet (0.5 mg total) by mouth daily as needed., Disp: 30 tablet, Rfl: 0   Blood Glucose Monitoring Suppl (ONE TOUCH ULTRA MINI)  W/DEVICE KIT, See admin instructions., Disp: , Rfl: 0   cetirizine (ZYRTEC) 10 MG tablet, TAKE 1 TABLET BY MOUTH EVERY DAY, Disp: 60 tablet, Rfl: 1   esomeprazole (NEXIUM) 40 MG capsule, TAKE 1 CAPSULE (40 MG TOTAL) BY MOUTH IN THE MORNING., Disp: 90 capsule, Rfl: 1   lisinopril (ZESTRIL) 5 MG tablet, TAKE 1 TABLET (5 MG TOTAL) BY MOUTH DAILY., Disp: 90 tablet, Rfl: 1   lovastatin (MEVACOR) 40 MG tablet, Take 1 tablet (40 mg total) by mouth at bedtime., Disp: 90 tablet, Rfl: 0   meloxicam (MOBIC) 15 MG tablet, TAKE 1 TABLET (15 MG TOTAL) BY MOUTH DAILY., Disp: 90 tablet, Rfl: 1   metFORMIN (GLUCOPHAGE-XR) 750 MG 24 hr tablet, TAKE 2 TABLETS (1,500 MG TOTAL) BY MOUTH DAILY WITH BREAKFAST., Disp: 180 tablet, Rfl: 0   Multiple Vitamin (MULTI VITAMIN DAILY PO), Take by mouth daily. , Disp: , Rfl:    ONE TOUCH ULTRA TEST test strip, USE TO TEST SUGAR 3 TIMES A DAY, Disp: , Rfl: 12   pioglitazone (ACTOS) 15 MG tablet, TAKE 1 TABLET (15 MG TOTAL) BY MOUTH DAILY., Disp: 90 tablet, Rfl: 1   tirzepatide (MOUNJARO) 10 MG/0.5ML Pen, Inject 10 mg into the skin once a week. In place of Trulicity for DM, Disp: 6 mL, Rfl: 0   triamcinolone cream (KENALOG) 0.1 %, Apply 1 application topically 3 (three) times daily., Disp: 453.6 g, Rfl: 0   valACYclovir (VALTREX) 1000 MG tablet, Take 1 tablet (1,000 mg total) by mouth 2 (two) times daily., Disp: 20 tablet, Rfl: 0   EPINEPHrine 0.3 mg/0.3 mL IJ SOAJ injection, Inject 0.3 mg into the muscle as needed for anaphylaxis. (Patient not taking: Reported on 10/31/2021), Disp: 2 each, Rfl: 0   famotidine (PEPCID) 40 MG tablet, TAKE 1 TABLET (40 MG TOTAL) BY MOUTH EVERY EVENING. AS NEEDED (Patient not taking: Reported on 10/31/2021), Disp: 90 tablet, Rfl: 0  No Known Allergies  I personally reviewed active problem list, medication list, allergies, family history, social history, health maintenance with the patient/caregiver today.   ROS  Constitutional: Negative for fever ,  positive for weight change.  Respiratory: Negative for cough and shortness of breath.   Cardiovascular: Negative for chest pain or palpitations.  Gastrointestinal: Negative for abdominal pain, no bowel changes.  Musculoskeletal: Negative for gait problem or joint swelling.  Skin: Negative for rash.  Neurological: Negative for dizziness or headache.  No other specific complaints in a complete review of systems (except as listed in HPI above).   Objective  Vitals:   10/31/21 0827  BP:  126/84  Pulse: 81  Resp: 16  Temp: 98.2 F (36.8 C)  SpO2: 97%  Weight: 254 lb (115.2 kg)  Height: '5\' 8"'  (1.727 m)    Body mass index is 38.62 kg/m.  Physical Exam  Constitutional: Patient appears well-developed and well-nourished. Obese  No distress.  HEENT: head atraumatic, normocephalic, pupils equal and reactive to light, neck supple, throat within normal limits Cardiovascular: Normal rate, regular rhythm and normal heart sounds.  No murmur heard. No BLE edema. Pulmonary/Chest: Effort normal and breath sounds normal. No respiratory distress. Abdominal: Soft.  There is no tenderness. Psychiatric: Patient has a normal mood and affect. behavior is normal. Judgment and thought content normal.    PHQ2/9: Depression screen [facility] 2/9 10/31/2021 07/26/2021 05/08/2021 04/13/2021 01/11/2021  Decreased Interest 0 0 0 0 0  Down, Depressed, Hopeless 0 0 0 0 0  PHQ - 2 Score 0 0 0 0 0  Altered sleeping 0 - 0 - -  Tired, decreased energy 0 - 0 - -  Change in appetite 0 - 0 - -  Feeling bad or failure about yourself  0 - 0 - -  Trouble concentrating 0 - 0 - -  Moving slowly or fidgety/restless 0 - 0 - -  Suicidal thoughts 0 - 0 - -  PHQ-9 Score 0 - 0 - -  Difficult doing work/chores - - Not difficult at all - -  Some recent data might be hidden    phq 9 is negative   Fall Risk: Fall Risk  10/31/2021 07/26/2021 05/08/2021 04/13/2021 01/11/2021  Falls in the past year? 0 0 0 0 0  Number falls in past yr: 0  0 - 0 0  Injury with Fall? 0 0 0 0 0  Risk for fall due to : No Fall Risks No Fall Risks - - -  Follow up Falls prevention discussed Falls prevention discussed - - -      Functional Status Survey: Is the patient deaf or have difficulty hearing?: No Does the patient have difficulty seeing, even when wearing glasses/contacts?: No Does the patient have difficulty concentrating, remembering, or making decisions?: No Does the patient have difficulty walking or climbing stairs?: No Does the patient have difficulty dressing or bathing?: No Does the patient have difficulty doing errands alone such as visiting a doctor's office or shopping?: No    Assessment & Plan  1. Diabetes mellitus with microalbuminuria (HCC)  - POCT HgB A1C - Microalbumin / creatinine urine ratio - metFORMIN (GLUCOPHAGE-XR) 750 MG 24 hr tablet; One daily  Dispense: 90 tablet; Refill: 0 - pioglitazone (ACTOS) 15 MG tablet; Take 1 tablet (15 mg total) by mouth daily.  Dispense: 90 tablet; Refill: 1 - tirzepatide (MOUNJARO) 10 MG/0.5ML Pen; Inject 10 mg into the skin once a week.  Dispense: 6 mL; Refill: 0  2.Dyslipidemia associated with type 2 diabetes mellitus (HCC)  - Lipid panel - metFORMIN (GLUCOPHAGE-XR) 750 MG 24 hr tablet; One daily  Dispense: 90 tablet; Refill: 0 - pioglitazone (ACTOS) 15 MG tablet; Take 1 tablet (15 mg total) by mouth daily.  Dispense: 90 tablet; Refill: 1  3. Morbid obesity ([place])  Discussed with the patient the risk posed by an increased BMI. Discussed importance of portion control, calorie counting and at least 150 minutes of physical activity weekly. Avoid sweet beverages and drink more water. Eat at least 6 servings of fruit and vegetables daily    4. Diabetes mellitus type 2 in obese ([place])  - metFORMIN (GLUCOPHAGE-XR)  750 MG 24 hr tablet; One daily  Dispense: 90 tablet; Refill: 0 - pioglitazone (ACTOS) 15 MG tablet; Take 1 tablet (15 mg total) by mouth daily.  Dispense: 90 tablet;  Refill: 1 - tirzepatide (MOUNJARO) 10 MG/0.5ML Pen; Inject 10 mg into the skin once a week.  Dispense: 6 mL; Refill: 0  5. HTN (hypertension), benign   6. Vitamin D insufficiency  - VITAMIN D 25 Hydroxy (Vit-D Deficiency, Fractures)  7. Pure hypercholesterolemia   8. Gastroesophageal reflux disease without esophagitis   9. Long-term use of high-risk medication  - CBC with Differential/Platelet - COMPLETE METABOLIC PANEL WITH GFR  10. Hypogonadism male  - Testosterone,Free and Total

## 2021-10-31 ENCOUNTER — Encounter: Payer: Self-pay | Admitting: Family Medicine

## 2021-10-31 ENCOUNTER — Ambulatory Visit (INDEPENDENT_AMBULATORY_CARE_PROVIDER_SITE_OTHER): Payer: 59 | Admitting: Family Medicine

## 2021-10-31 VITALS — BP 126/84 | HR 81 | Temp 98.2°F | Resp 16 | Ht 68.0 in | Wt 254.0 lb

## 2021-10-31 DIAGNOSIS — Z79899 Other long term (current) drug therapy: Secondary | ICD-10-CM

## 2021-10-31 DIAGNOSIS — I10 Essential (primary) hypertension: Secondary | ICD-10-CM

## 2021-10-31 DIAGNOSIS — E559 Vitamin D deficiency, unspecified: Secondary | ICD-10-CM

## 2021-10-31 DIAGNOSIS — R809 Proteinuria, unspecified: Secondary | ICD-10-CM | POA: Diagnosis not present

## 2021-10-31 DIAGNOSIS — E1169 Type 2 diabetes mellitus with other specified complication: Secondary | ICD-10-CM | POA: Diagnosis not present

## 2021-10-31 DIAGNOSIS — K219 Gastro-esophageal reflux disease without esophagitis: Secondary | ICD-10-CM

## 2021-10-31 DIAGNOSIS — E785 Hyperlipidemia, unspecified: Secondary | ICD-10-CM

## 2021-10-31 DIAGNOSIS — E669 Obesity, unspecified: Secondary | ICD-10-CM

## 2021-10-31 DIAGNOSIS — E1129 Type 2 diabetes mellitus with other diabetic kidney complication: Secondary | ICD-10-CM | POA: Diagnosis not present

## 2021-10-31 DIAGNOSIS — E7800 Pure hypercholesterolemia, unspecified: Secondary | ICD-10-CM

## 2021-10-31 DIAGNOSIS — E291 Testicular hypofunction: Secondary | ICD-10-CM

## 2021-10-31 LAB — POCT GLYCOSYLATED HEMOGLOBIN (HGB A1C): Hemoglobin A1C: 7.1 % — AB (ref 4.0–5.6)

## 2021-10-31 MED ORDER — TIRZEPATIDE 10 MG/0.5ML ~~LOC~~ SOAJ: 10.0000 mg | SUBCUTANEOUS | 0 refills | Status: DC

## 2021-10-31 MED ORDER — METFORMIN HCL ER 750 MG PO TB24: ORAL_TABLET | ORAL | 0 refills | Status: DC

## 2021-10-31 MED ORDER — PIOGLITAZONE HCL 15 MG PO TABS: 15.0000 mg | ORAL_TABLET | Freq: Every day | ORAL | 1 refills | Status: DC

## 2021-11-01 ENCOUNTER — Other Ambulatory Visit: Payer: Self-pay | Admitting: Family Medicine

## 2021-11-01 DIAGNOSIS — M7541 Impingement syndrome of right shoulder: Secondary | ICD-10-CM

## 2021-11-01 DIAGNOSIS — G8929 Other chronic pain: Secondary | ICD-10-CM

## 2021-11-01 NOTE — Telephone Encounter (Signed)
Requested Prescriptions  Pending Prescriptions Disp Refills   meloxicam (MOBIC) 15 MG tablet [Pharmacy Med Name: MELOXICAM 15 MG TABLET] 90 tablet 1    Sig: TAKE 1 TABLET (15 MG TOTAL) BY MOUTH DAILY.     Analgesics:  COX2 Inhibitors Passed - 11/01/2021  2:00 PM      Passed - HGB in normal range and within 360 days    Hemoglobin  Date Value Ref Range Status  10/31/2021 13.6 13.2 - 17.1 g/dL Final         Passed - Cr in normal range and within 360 days    Creat  Date Value Ref Range Status  10/31/2021 1.03 0.60 - 1.29 mg/dL Final   Creatinine, Urine  Date Value Ref Range Status  10/31/2021 164 20 - 320 mg/dL Final         Passed - Patient is not pregnant      Passed - Valid encounter within last 12 months    Recent Outpatient Visits          Yesterday Diabetes mellitus with microalbuminuria [facility])   [facility] Alba Cory, MD   3 months ago Diabetes mellitus type 2 in obese [facility])   [facility] Alba Cory, MD   5 months ago COVID-19   [facility] [place], [place], NP   6 months ago Diabetes mellitus type 2 in obese [facility])   [facility] Alba Cory, MD   9 months ago Diabetes mellitus type 2 in obese [facility])   [facility] Alba Cory, MD      Future Appointments            In 3 months Carlynn Purl, Danna Hefty, MD [facility]

## 2021-11-03 LAB — COMPLETE METABOLIC PANEL WITH GFR
AG Ratio: 1.7 (calc) (ref 1.0–2.5)
ALT: 39 U/L (ref 9–46)
AST: 28 U/L (ref 10–40)
Albumin: 4.4 g/dL (ref 3.6–5.1)
Alkaline phosphatase (APISO): 45 U/L (ref 36–130)
BUN: 22 mg/dL (ref 7–25)
CO2: 30 mmol/L (ref 20–32)
Calcium: 9.7 mg/dL (ref 8.6–10.3)
Chloride: 103 mmol/L (ref 98–110)
Creat: 1.03 mg/dL (ref 0.60–1.29)
Globulin: 2.6 g/dL (calc) (ref 1.9–3.7)
Glucose, Bld: 154 mg/dL — ABNORMAL HIGH (ref 65–99)
Potassium: 4.7 mmol/L (ref 3.5–5.3)
Sodium: 139 mmol/L (ref 135–146)
Total Bilirubin: 0.6 mg/dL (ref 0.2–1.2)
Total Protein: 7 g/dL (ref 6.1–8.1)
eGFR: 89 mL/min/{1.73_m2} (ref >=60)

## 2021-11-03 LAB — CBC WITH DIFFERENTIAL/PLATELET
Absolute Monocytes: 488 cells/uL (ref 200–950)
Basophils Absolute: 81 cells/uL (ref 0–200)
Basophils Relative: 1.1 %
Eosinophils Absolute: 377 cells/uL (ref 15–500)
Eosinophils Relative: 5.1 %
HCT: 41 % (ref 38.5–50.0)
Hemoglobin: 13.6 g/dL (ref 13.2–17.1)
Lymphs Abs: 2013 cells/uL (ref 850–3900)
MCH: 30.1 pg (ref 27.0–33.0)
MCHC: 33.2 g/dL (ref 32.0–36.0)
MCV: 90.7 fL (ref 80.0–100.0)
MPV: 12.2 fL (ref 7.5–12.5)
Monocytes Relative: 6.6 %
Neutro Abs: 4440 cells/uL (ref 1500–7800)
Neutrophils Relative %: 60 %
Platelets: 216 10*3/uL (ref 140–400)
RBC: 4.52 10*6/uL (ref 4.20–5.80)
RDW: 12.8 % (ref 11.0–15.0)
Total Lymphocyte: 27.2 %
WBC: 7.4 10*3/uL (ref 3.8–10.8)

## 2021-11-03 LAB — LIPID PANEL
Cholesterol: 165 mg/dL (ref <200)
HDL: 54 mg/dL (ref >=40)
LDL Cholesterol (Calc): 86 mg/dL (calc)
Non-HDL Cholesterol (Calc): 111 mg/dL (calc) (ref <130)
Total CHOL/HDL Ratio: 3.1 (calc) (ref <5.0)
Triglycerides: 148 mg/dL (ref <150)

## 2021-11-03 LAB — TESTOSTERONE, FREE & TOTAL
Free Testosterone: 44.3 pg/mL (ref 35.0–155.0)
Testosterone, Total, LC-MS-MS: 317 ng/dL (ref 250–1100)

## 2021-11-03 LAB — MICROALBUMIN / CREATININE URINE RATIO
Creatinine, Urine: 164 mg/dL (ref 20–320)
Microalb Creat Ratio: 29 mcg/mg creat (ref <30)
Microalb, Ur: 4.7 mg/dL

## 2021-11-03 LAB — VITAMIN D 25 HYDROXY (VIT D DEFICIENCY, FRACTURES): Vit D, 25-Hydroxy: 34 ng/mL (ref 30–100)

## 2021-11-07 ENCOUNTER — Telehealth: Payer: Self-pay

## 2021-11-07 NOTE — Telephone Encounter (Signed)
Copied from CRM [id number]. Topic: General - Inquiry >> Nov 07, 2021 10:15 AM Crist Infante wrote: Reason for CRM: pt return Dois Davenport call.  It does not say anyone else can speak with him

## 2021-11-27 ENCOUNTER — Telehealth: Payer: Self-pay | Admitting: Family Medicine

## 2021-11-27 NOTE — Telephone Encounter (Signed)
We do not have any samples available. He can call around to see if any pharmacies in the area have it in stock or we can try and switch to a more available medication. I contacted Apolinar Junes our [facility] representative to see if he has any ideas and will contact patient if he returns call.

## 2021-11-27 NOTE — Telephone Encounter (Signed)
[facility] called and spoke to [place], [facility] about the refill(s) Actos and [facility] requested. Advised both were sent on 10/31/21. He says the Actos was too early to refill on 10/31/21 because the patient picked up a 90 day supply on 09/11/21, but now he can refill and have it ready for pickup in 1 hour. He says the Greggory Keen is still on order from the supplier and as soon as they receive the 10 mg pen, they will fill the prescription. Patient is requesting samples of Mounjaro from the office.

## 2021-11-27 NOTE — Telephone Encounter (Signed)
Copied from [id number] [id number]. Topic: Quick Communication - Rx Refill/Question >> Nov 27, 2021  1:50 PM Leward Quan A wrote: Medication: pioglitazone (ACTOS) 15 MG tablet  tirzepatide [facility]) 10 MG/0.5ML Pen still not in stock need samples if available  Has the patient contacted their pharmacy? Yes.  Need Rx sent to pharmacy  (Agent: If no, request that the patient contact the pharmacy for the refill. If patient does not wish to contact the pharmacy document the reason why and proceed with request.) (Agent: If yes, when and what did the pharmacy advise?)  Preferred Pharmacy (with phone number or street name): [id number] [place], [place] [facility] [address]  Phone:  [phone] Fax:  [phone]    Has the patient been seen for an appointment in the last year OR does the patient have an upcoming appointment? Yes.    Agent: Please be advised that RX refills may take up to 3 business days. We ask that you follow-up with your pharmacy.

## 2021-11-30 ENCOUNTER — Other Ambulatory Visit: Payer: Self-pay

## 2021-11-30 DIAGNOSIS — R809 Proteinuria, unspecified: Secondary | ICD-10-CM

## 2021-11-30 DIAGNOSIS — E1129 Type 2 diabetes mellitus with other diabetic kidney complication: Secondary | ICD-10-CM

## 2021-11-30 DIAGNOSIS — E1169 Type 2 diabetes mellitus with other specified complication: Secondary | ICD-10-CM

## 2021-11-30 MED ORDER — TIRZEPATIDE 10 MG/0.5ML ~~LOC~~ SOAJ: 5.0000 mg | SUBCUTANEOUS | 0 refills | Status: DC

## 2021-12-12 ENCOUNTER — Ambulatory Visit: Payer: Self-pay

## 2021-12-12 NOTE — Telephone Encounter (Signed)
?  Chief Complaint: medication alternative ?Symptoms: BS 347, frequent urination and thirst ?Frequency: 2 weeks now ?Pertinent Negatives: Patient denies feeling dizziness  ?Disposition: [] ED /[] Urgent Care (no appt availability in office) / [] Appointment(In office/virtual)/ []  [place] [facility]/ [] Home Care/ [] Refused Recommended Disposition /[] [place] [facility]/ [x]  Follow-up with PCP ?Additional Notes: Pt states he is having high BS d/t not having the Mourjaro since its been on back order for about 1 month. He has called the pharmacy and they have not gave a release date of when it'll be back in stock so pt is asking if something can be called in to replace it until its available again. Advised him I will send over to Dr. and will f/up if have any questions.  ? ?Summary: blood sugar reading for today 347/Requesting Medication  ? Pt stated his last blood sugar reading for today is of 347. Pt stated he still has not been able to get medication tirzepatide (MOUNJARO) 10 MG/0.5ML Pen.  ?Pt requesting a new medication if possible while he waits for tirzepatide [facility]) 10 MG/0.5ML Pen to be available.  ? ?Pt seeking clinical advice.  ?  ? ? ? ?Reason for Disposition ? [1] Caller has URGENT medication or insulin pump question AND [2] triager unable to answer question ? ?Answer Assessment - Initial Assessment Questions ?1. BLOOD GLUCOSE: "What is your blood glucose level?"  ?    347 ?2. ONSET: "When did you check the blood glucose?" ?    15 mins ago  ?3. USUAL RANGE: "What is your glucose level usually?" (e.g., usual fasting morning value, usual evening value) ?    120-150s ?5. TYPE 1 or 2:  "Do you know what type of diabetes you have?"  (e.g., Type 1, Type 2, Gestational; doesn't know)  ?    DM 2 ?6. INSULIN: "Do you take insulin?" "What type of insulin(s) do you use? What is the mode of delivery? (syringe, pen; injection or pump)?"  ?    Mourjuro ?7. DIABETES PILLS: "Do you take any pills for your  diabetes?" If yes, ask: "Have you missed taking any pills recently?" ?    Yes metformin  ?8. OTHER SYMPTOMS: "Do you have any symptoms?" (e.g., fever, frequent urination, difficulty breathing, dizziness, weakness, vomiting) ?    Frequent urination and thirst ? ?Protocols used: Diabetes - High Blood Sugar-A-AH ? ?

## 2021-12-14 NOTE — Telephone Encounter (Signed)
Patient notified and will stop by to pick up sample ?

## 2021-12-25 ENCOUNTER — Other Ambulatory Visit: Payer: Self-pay | Admitting: Nurse Practitioner

## 2021-12-25 DIAGNOSIS — K219 Gastro-esophageal reflux disease without esophagitis: Secondary | ICD-10-CM

## 2021-12-25 NOTE — Telephone Encounter (Signed)
Requested Prescriptions  ?Pending Prescriptions Disp Refills  ?? esomeprazole (NEXIUM) 40 MG capsule [Pharmacy Med Name: ESOMEPRAZOLE MAG DR 40 MG CAP] 90 capsule 1  ?  Sig: TAKE 1 CAPSULE (40 MG TOTAL) BY MOUTH IN THE MORNING.  ?  ? Gastroenterology: Proton Pump Inhibitors 2 Passed - 12/25/2021 11:22 AM  ?  ?  Passed - ALT in normal range and within 360 days  ?  ALT  ?Date Value Ref Range Status  ?10/31/2021 39 9 - 46 U/L Final  ?   ?  ?  Passed - AST in normal range and within 360 days  ?  AST  ?Date Value Ref Range Status  ?10/31/2021 28 10 - 40 U/L Final  ?   ?  ?  Passed - Valid encounter within last 12 months  ?  Recent Outpatient Visits   ?      ? 1 month ago Diabetes mellitus with microalbuminuria (HCC)  ? [facility] [place], Danna Hefty, MD  ? 5 months ago Diabetes mellitus type 2 in obese [facility])  ? [facility] Alba Cory, MD  ? 7 months ago COVID-19  ? [facility] [place], Elnita Maxwell, NP  ? 8 months ago Diabetes mellitus type 2 in obese [facility])  ? [facility] Leon, Danna Hefty, MD  ? 11 months ago Diabetes mellitus type 2 in obese [facility])  ? [facility] Alba Cory, MD  ?  ?  ?Future Appointments   ?        ? In 1 month Alba Cory, MD [facility]  ?  ? ?  ?  ?  ? ?

## 2021-12-25 NOTE — Telephone Encounter (Signed)
Requested medications are due for refill today.  na ? ?Requested medications are on the active medications list.  yes ? ?Last refill. 12/25/2021 #90 1 refill ? ?Future visit scheduled.   yes ? ?Notes to clinic.  Pharmacy comment: Alternative Requested:NOT COVERED BY INSURANCE. ? ? ? ?Requested Prescriptions  ?Pending Prescriptions Disp Refills  ? esomeprazole (NEXIUM) 40 MG capsule [Pharmacy Med Name: ESOMEPRAZOLE MAG DR 40 MG CAP] 90 capsule 1  ?  Sig: TAKE 1 CAPSULE (40 MG TOTAL) BY MOUTH IN THE MORNING.  ?  ? Gastroenterology: Proton Pump Inhibitors 2 Passed - 12/25/2021  3:39 PM  ?  ?  Passed - ALT in normal range and within 360 days  ?  ALT  ?Date Value Ref Range Status  ?10/31/2021 39 9 - 46 U/L Final  ?  ?  ?  ?  Passed - AST in normal range and within 360 days  ?  AST  ?Date Value Ref Range Status  ?10/31/2021 28 10 - 40 U/L Final  ?  ?  ?  ?  Passed - Valid encounter within last 12 months  ?  Recent Outpatient Visits   ? ?      ? 1 month ago Diabetes mellitus with microalbuminuria ([place])  ? [facility] [place], Drue Stager, MD  ? 5 months ago Diabetes mellitus type 2 in obese [facility])  ? [facility] Steele Sizer, MD  ? 7 months ago COVID-19  ? [facility] [place], Malachy Mood, NP  ? 8 months ago Diabetes mellitus type 2 in obese [facility])  ? [facility] [place], Drue Stager, MD  ? 11 months ago Diabetes mellitus type 2 in obese [facility])  ? [facility] Steele Sizer, MD  ? ?  ?  ?Future Appointments   ? ?        ? In 1 month Steele Sizer, MD [facility], [place]  ? ?  ? ?  ?  ?  ?  ?

## 2021-12-26 ENCOUNTER — Other Ambulatory Visit: Payer: Self-pay

## 2021-12-26 DIAGNOSIS — K219 Gastro-esophageal reflux disease without esophagitis: Secondary | ICD-10-CM

## 2021-12-31 NOTE — Telephone Encounter (Signed)
PA in process

## 2022-01-20 ENCOUNTER — Other Ambulatory Visit: Payer: Self-pay | Admitting: Family Medicine

## 2022-01-20 DIAGNOSIS — E1129 Type 2 diabetes mellitus with other diabetic kidney complication: Secondary | ICD-10-CM

## 2022-01-21 ENCOUNTER — Other Ambulatory Visit: Payer: Self-pay

## 2022-02-01 NOTE — Progress Notes (Signed)
Name: Trevor Dennis.   MRN: [id number]    DOB: Jun 22, 1971   Date:02/04/2022 ? ?     Progress Note ? ?Subjective ? ?Chief Complaint ? ?Follow Up ? ?HPI ? ?Diabetes Type 2 : A1C  January 2020 was 8%, A1C has gone as high at 9.7%, but has been better controlled lately. Last visit it was 7.1%. He was on Mounjarno , there a shortage had a 2 months gap on medications, and is now on Ozempic 2 mg and just got the mounjarno filled but it costs $ 200 ( advised him to find out what is covered by insurance)  Still on Metformin 750 mg  and  Actos 15 mg.He denies polyphagia, polydipsia or polyuria. He has obesity, fatty liver, dyslipidemia, microalbuminuria.  ED  associated with DM. He also takes lisinopril  for microalbuminuria , last LDL was above goal but he is now compliant with lovastatin 40 mg and we will recheck next visit  ?  ?Chronic neck/Back pain/OA knee and elbow: he went to [facility] but out of network, he is now going to [place], he states he was told he has OA. He states he is aching all the time, he takes Tylenol prn and Meloxicam daily  ( discussed risk of kidney damage) , pain can be sharp  Symptoms stable.  ?  ?Fatty liver; he had [place] in 2018, his last liver enzymes were back to normal.  ?  ?Hyperlipidemia: his LDL was reviewed and at goal, last LDL was up to 86 , he has been taking Lovastatin daily now  ?  ?Morbid Obesity: he quit smoking last Fall , he splurged during the holidays , weight is down 6 lbs, he states more active at work / hot and sweaty and also playing disc gold a few times a week. ? ?GERD: . Controlled now, usually PPI in am, only takes H2 blocker prn at night but not lately .  ?  ?Snoring: his 1 yo son states snoring not as bad lately. His ESS was 10 back in Nov 2019, he states still wakes up feeling tired sometimes, he is not interested in doing a sleep study, due to cost. He has a new insurance but would like to wait for now Unchanged  ? 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 ?Hypogonadism: levels were low in the past , feels tired, he does not have libido, he also has ED but states worse symptoms is the drive.  ? ?Patient Active Problem List  ? Diagnosis Date Noted  ? Osteoarthritis, generalized 07/26/2021  ? HTN (hypertension), benign 07/26/2021  ? Chronic neck pain 11/17/2018  ? History of lumbar discectomy 03/04/2018  ? Hypotestosteronism 11/26/2017  ? History of subarachnoid hemorrhage 09/17/2017  ? Vitamin D insufficiency 08/26/2017  ? GERD (gastroesophageal reflux disease) 10/25/2016  ? Morbid obesity ([place]) 10/25/2016  ? 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 Anxiety 01/01/2016  ? Elevated liver enzymes 07/27/2015  ? Intermittent low back pain 07/27/2015  ? Fatty liver 05/03/2015  ? Dyslipidemia associated with type 2 diabetes mellitus ([place]) 04/24/2015  ? HLD (hyperlipidemia) 04/24/2015  ? Fever blister 04/24/2015  ? ? ?Past Surgical History:  ?Procedure Laterality Date  ? BACK SURGERY  8 years ago  ? ? ?Family History  ?Problem Relation Age of Onset  ? Diabetes Mother   ? Liver disease Brother   ? Alcohol abuse Brother   ? ADD / ADHD Son   ?  ADD / ADHD Son   ? ? ?Social History  ? ?Tobacco Use  ? Smoking status: Former  ?  Packs/day: 0.25  ?  Types: Cigarettes  ?  Start date: 10/14/1989  ?  Quit date: 05/15/2021  ?  Years since quitting: 0.7  ? Smokeless tobacco: Never  ? Tobacco comments:  ?  Only smoking cigars occasionally  ?Substance Use Topics  ? Alcohol use: Yes  ?  Alcohol/week: 0.0 standard drinks  ?  Comment: Occasional   ? ? ? ?Current Outpatient Medications:  ?  ALPRAZolam (XANAX) 0.5 MG tablet, Take 1 tablet (0.5 mg total) by mouth daily as needed., Disp: 30 tablet, Rfl: 0 ?  Blood Glucose Monitoring Suppl (ONE TOUCH ULTRA MINI) W/DEVICE KIT, See admin instructions., Disp: , Rfl: 0 ?  cetirizine (ZYRTEC) 10 MG tablet, TAKE 1 TABLET BY MOUTH EVERY DAY, Disp: 60 tablet, Rfl: 1 ?  lisinopril (ZESTRIL) 5 MG tablet, TAKE 1 TABLET BY MOUTH EVERY  DAY, Disp: 90 tablet, Rfl: 1 ?  meloxicam (MOBIC) 15 MG tablet, TAKE 1 TABLET (15 MG TOTAL) BY MOUTH DAILY., Disp: 90 tablet, Rfl: 1 ?  Multiple Vitamin (MULTI VITAMIN DAILY PO), Take by mouth daily. , Disp: , Rfl:  ?  ONE TOUCH ULTRA TEST test strip, USE TO TEST SUGAR 3 TIMES A DAY, Disp: , Rfl: 12 ?  pioglitazone (ACTOS) 15 MG tablet, Take 1 tablet (15 mg total) by mouth daily., Disp: 90 tablet, Rfl: 1 ?  tirzepatide (MOUNJARO) 10 MG/0.5ML Pen, Inject 5 mg into the skin once a week., Disp: 6 mL, Rfl: 0 ?  triamcinolone cream (KENALOG) 0.1 %, Apply 1 application topically 3 (three) times daily., Disp: 453.6 g, Rfl: 0 ?  valACYclovir (VALTREX) 1000 MG tablet, Take 1 tablet (1,000 mg total) by mouth 2 (two) times daily., Disp: 20 tablet, Rfl: 0 ?  EPINEPHrine 0.3 mg/0.3 mL IJ SOAJ injection, Inject 0.3 mg into the muscle as needed for anaphylaxis. (Patient not taking: Reported on 02/04/2022), Disp: 2 each, Rfl: 0 ?  esomeprazole (NEXIUM) 40 MG capsule, Take 1 capsule (40 mg total) by mouth in the morning., Disp: 90 capsule, Rfl: 0 ?  famotidine (PEPCID) 40 MG tablet, TAKE 1 TABLET (40 MG TOTAL) BY MOUTH EVERY EVENING. AS NEEDED (Patient not taking: Reported on 02/04/2022), Disp: 90 tablet, Rfl: 0 ?  lovastatin (MEVACOR) 40 MG tablet, Take 1 tablet (40 mg total) by mouth at bedtime., Disp: 90 tablet, Rfl: 0 ?  metFORMIN (GLUCOPHAGE-XR) 750 MG 24 hr tablet, One daily, Disp: 90 tablet, Rfl: 0 ? ?No Known Allergies ? ?I personally reviewed active problem list, medication list, allergies, family history, social history, health maintenance with the patient/caregiver today. ? ? ?ROS ? ?Constitutional: Negative for fever or significant  weight change.  ?Respiratory: Negative for cough and shortness of breath.   ?Cardiovascular: Negative for chest pain or palpitations.  ?Gastrointestinal: Negative for abdominal pain, no bowel changes.  ?Musculoskeletal: Negative for gait problem or joint swelling.  ?Skin: Negative for rash.   ?Neurological: Negative for dizziness or headache.  ?No other specific complaints in a complete review of systems (except as listed in HPI above).  ? ?Objective ? ?Vitals:  ? 02/04/22 0822  ?BP: 120/70  ?Pulse: 80  ?Resp: 16  ?SpO2: 97%  ?Weight: 248 lb (112.5 kg)  ?Height: '5\' 8"'  (1.727 m)  ? ? ?Body mass index is 37.71 kg/m?. ? ?Physical Exam ? ?Constitutional: Patient appears well-developed and well-nourished. Obese  No distress.  ?  HEENT: head atraumatic, normocephalic, pupils equal and reactive to light, neck supple, throat within normal limits ?Cardiovascular: Normal rate, regular rhythm and normal heart sounds.  No murmur heard. No BLE edema. ?Pulmonary/Chest: Effort normal and breath sounds normal. No respiratory distress. ?Abdominal: Soft.  There is no tenderness. ?Psychiatric: Patient has a normal mood and affect. behavior is normal. Judgment and thought content normal.  ? ?Diabetic Foot Exam: ?Diabetic Foot Exam - Simple   ?Simple Foot Form ?Visual Inspection ?No deformities, no ulcerations, no other skin breakdown bilaterally: Yes ?Sensation Testing ?See comments: Yes ?Pulse Check ?Posterior Tibialis and Dorsalis pulse intact bilaterally: Yes ?Comments ?He has numbness of right first toe from previous surgery  ?  ? ? ? ?PHQ2/9: ? ?  02/04/2022  ?  8:22 AM 10/31/2021  ?  8:27 AM 07/26/2021  ?  8:14 AM 05/08/2021  ? 10:47 AM 04/13/2021  ?  8:32 AM  ?Depression screen PHQ 2/9  ?Decreased Interest 0 0 0 0 0  ?Down, Depressed, Hopeless 0 0 0 0 0  ?PHQ - 2 Score 0 0 0 0 0  ?Altered sleeping 0 0  0   ?Tired, decreased energy 0 0  0   ?Change in appetite 0 0  0   ?Feeling bad or failure about yourself  0 0  0   ?Trouble concentrating 0 0  0   ?Moving slowly or fidgety/restless 0 0  0   ?Suicidal thoughts 0 0  0   ?PHQ-9 Score 0 0  0   ?Difficult doing work/chores    Not difficult at all   ?  ?phq 9 is negative ? ? ?Fall Risk: ? ?  02/04/2022  ?  8:21 AM 10/31/2021  ?  8:27 AM 07/26/2021  ?  8:13 AM 05/08/2021  ? 10:47 AM  04/13/2021  ?  8:32 AM  ?Fall Risk   ?Falls in the past year? 0 0 0 0 0  ?Number falls in past yr: 0 0 0  0  ?Injury with Fall? 0 0 0 0 0  ?Risk for fall due to : No Fall Risks No Fall Risks No Fall Risk

## 2022-02-04 ENCOUNTER — Encounter: Payer: Self-pay | Admitting: Family Medicine

## 2022-02-04 ENCOUNTER — Ambulatory Visit (INDEPENDENT_AMBULATORY_CARE_PROVIDER_SITE_OTHER): Payer: 59 | Admitting: Family Medicine

## 2022-02-04 VITALS — BP 120/70 | HR 80 | Resp 16 | Ht 68.0 in | Wt 248.0 lb

## 2022-02-04 DIAGNOSIS — E785 Hyperlipidemia, unspecified: Secondary | ICD-10-CM

## 2022-02-04 DIAGNOSIS — Z1211 Encounter for screening for malignant neoplasm of colon: Secondary | ICD-10-CM

## 2022-02-04 DIAGNOSIS — R809 Proteinuria, unspecified: Secondary | ICD-10-CM

## 2022-02-04 DIAGNOSIS — E7800 Pure hypercholesterolemia, unspecified: Secondary | ICD-10-CM | POA: Diagnosis not present

## 2022-02-04 DIAGNOSIS — E1169 Type 2 diabetes mellitus with other specified complication: Secondary | ICD-10-CM

## 2022-02-04 DIAGNOSIS — E669 Obesity, unspecified: Secondary | ICD-10-CM

## 2022-02-04 DIAGNOSIS — K219 Gastro-esophageal reflux disease without esophagitis: Secondary | ICD-10-CM | POA: Diagnosis not present

## 2022-02-04 DIAGNOSIS — E1129 Type 2 diabetes mellitus with other diabetic kidney complication: Secondary | ICD-10-CM

## 2022-02-04 MED ORDER — METFORMIN HCL ER 750 MG PO TB24: ORAL_TABLET | ORAL | 0 refills | Status: DC

## 2022-02-04 MED ORDER — ESOMEPRAZOLE MAGNESIUM 40 MG PO CPDR
40.0000 mg | DELAYED_RELEASE_CAPSULE | Freq: Every morning | ORAL | 0 refills | Status: DC
  Filled 2022-07-11: qty 30, 30d supply, fill #0

## 2022-02-04 MED ORDER — LOVASTATIN 40 MG PO TABS: 40.0000 mg | ORAL_TABLET | Freq: Every day | ORAL | 0 refills | Status: DC

## 2022-02-04 NOTE — Patient Instructions (Signed)
Call insurance and ask what GLP-1 agonist is preferred  ?Mounjarno, Ozempic, Trulicity, Victoza  ?

## 2022-02-06 ENCOUNTER — Telehealth: Payer: Self-pay | Admitting: Family Medicine

## 2022-02-06 NOTE — Telephone Encounter (Signed)
Copied from [place] [id number]. Topic: General - Other ?>> Feb 06, 2022  2:14 PM Leward Quan A wrote: ?Reason for CRM: Patient called in to inform Dr Ancil Boozer that the insurance company say they will cover Ozempic, but will require Prior authorization through [employer] or [email or web address]  at Ph# [phone]   Ph# [phone] ?

## 2022-03-08 ENCOUNTER — Telehealth: Payer: Self-pay

## 2022-03-08 NOTE — Telephone Encounter (Unsigned)
Copied from CRM [id number]. Topic: General - Other >> Mar 08, 2022 11:37 AM Gaetana Michaelis A wrote: Reason for CRM: The patient has called to request completion of a prior authorization for their tirzepatide [facility]) 10 MG/0.5ML Pen [[id number]]   The patient has been told that a prior authorization of the medication must be completed monthly   The patient shares that they are out of the medication and would like to know if there are any samples at the practice available for them to pick up   Please contact further when possible

## 2022-03-08 NOTE — Telephone Encounter (Signed)
Last PA was in January

## 2022-03-15 ENCOUNTER — Telehealth: Payer: Self-pay

## 2022-03-15 NOTE — Telephone Encounter (Signed)
I'm currently doing a PA for his Trevor Dennis (he's been out 10 days and he stated Dr. Carlynn Purl approved a sample so he will pick that up), but he stated Trulicity is $100 cheaper per his insurance. He wanted to know what you guys recommended as far as which medication to take. He also request if we can do a 49-month supply if possible of whichever recommended rx.

## 2022-04-25 ENCOUNTER — Other Ambulatory Visit: Payer: Self-pay | Admitting: Family Medicine

## 2022-04-25 DIAGNOSIS — G8929 Other chronic pain: Secondary | ICD-10-CM

## 2022-04-25 DIAGNOSIS — E1169 Type 2 diabetes mellitus with other specified complication: Secondary | ICD-10-CM

## 2022-04-25 DIAGNOSIS — M7541 Impingement syndrome of right shoulder: Secondary | ICD-10-CM

## 2022-04-25 DIAGNOSIS — E7800 Pure hypercholesterolemia, unspecified: Secondary | ICD-10-CM

## 2022-05-07 NOTE — Progress Notes (Signed)
Name: Trevor Dennis.   MRN: [id number]    DOB: 1971/03/19   Date:05/08/2022       Progress Note  Subjective  Chief Complaint  Follow Up  HPI  Diabetes Type 2 : A1C  January 2020 was 8%, A1C has gone as high at 9.7%, but has been better controlled lately. It was 7.1 % last visit and today is down to 6.3 % He is taking Mounjarno but because they have shortages when they don't have the dose he doubles the dose of Metformin to 1500 mg, he is also taking Actos 15 mg  he is also cutting on portion size, drinking more water, he has been physically active and only drinking one soda a day. Marland KitchenHe denies polyphagia, polydipsia or polyuria. He has obesity, fatty liver, dyslipidemia, microalbuminuria.  ED  associated with DM. He also takes lisinopril  for microalbuminuria , last LDL went up a little  he has been compliant with medication    Chronic neck/Back pain/OA knee and elbow: he went to [facility] but out of network, he is now going to [place], he states he was told he has OA. He states he is aching all the time, he takes Tylenol prn and Meloxicam daily  ( discussed risk of kidney damage) , pain can be sharp  Unchanged    Fatty liver; he had [place] in 2018, his last liver enzymes were back to normal.    Hyperlipidemia: his LDL was reviewed and at goal, last LDL was up to 86 , he has been taking Lovastatin daily now    Morbid Obesity: he quit smoking last Fall , he splurged during the holidays , weight is down again 3 lbs  he states more active at work / hot and sweaty and also playing disc gold a few times a week, also has a better diet   GERD: . Controlled now, usually PPI in am, only takes H2 blocker prn at night but not lately .    Snoring: his 31 yo son states snoring not as bad lately. His ESS was 10 back in Nov 2019, he states still wakes up feeling tired sometimes, he is not interested in doing a sleep study, due to cost. He has a new insurance but would like to wait for now   Left femur mass:  he saw oncologist and negative evaluation Unchanged   Hypogonadism: levels were low in the past , feels tired, he does not have libido, he also has ED. 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   Anxiety 01/01/2016   Elevated liver enzymes 07/27/2015   Intermittent low back pain 07/27/2015   Fatty liver 05/03/2015   Dyslipidemia associated with type 2 diabetes mellitus (HCC) 04/24/2015   HLD (hyperlipidemia) 04/24/2015   Fever blister 04/24/2015    Past Surgical History:  Procedure Laterality Date   BACK SURGERY  8 years ago    Family History  Problem Relation Age of Onset  Diabetes Mother    Liver disease Brother    Alcohol abuse Brother    ADD / ADHD Son    ADD / ADHD Son     Social History   Tobacco Use   Smoking status: Former    Packs/day: 0.25    Types: Cigarettes    Start date: 10/14/1989    Quit date: 05/15/2021    Years since quitting: 0.9   Smokeless tobacco: Never   Tobacco comments:    Only smoking cigars occasionally  Substance Use Topics   Alcohol use: Yes    Alcohol/week: 0.0 standard drinks of alcohol    Comment: Occasional      Current Outpatient Medications:    ALPRAZolam (XANAX) 0.5 MG tablet, Take 1 tablet (0.5 mg total) by mouth daily as needed., Disp: 30 tablet, Rfl: 0   cetirizine (ZYRTEC) 10 MG tablet, TAKE 1 TABLET BY MOUTH EVERY DAY, Disp: 60 tablet, Rfl: 1   esomeprazole  (NEXIUM) 40 MG capsule, Take 1 capsule (40 mg total) by mouth in the morning., Disp: 90 capsule, Rfl: 0   Fluocinonide 0.1 % CREA, Apply 1 g topically 2 (two) times daily., Disp: 120 g, Rfl: 0   lisinopril (ZESTRIL) 5 MG tablet, TAKE 1 TABLET BY MOUTH EVERY DAY, Disp: 90 tablet, Rfl: 1   lovastatin (MEVACOR) 40 MG tablet, TAKE 1 TABLET BY MOUTH EVERYDAY AT BEDTIME, Disp: 90 tablet, Rfl: 0   meloxicam (MOBIC) 15 MG tablet, TAKE 1 TABLET (15 MG TOTAL) BY MOUTH DAILY., Disp: 90 tablet, Rfl: 1   metFORMIN (GLUCOPHAGE-XR) 750 MG 24 hr tablet, One daily, Disp: 90 tablet, Rfl: 0   Multiple Vitamin (MULTI VITAMIN DAILY PO), Take by mouth daily. , Disp: , Rfl:    pioglitazone (ACTOS) 15 MG tablet, Take 1 tablet (15 mg total) by mouth daily., Disp: 90 tablet, Rfl: 1   triamcinolone cream (KENALOG) 0.1 %, Apply 1 application topically 3 (three) times daily., Disp: 453.6 g, Rfl: 0   valACYclovir (VALTREX) 1000 MG tablet, Take 1 tablet (1,000 mg total) by mouth 2 (two) times daily., Disp: 20 tablet, Rfl: 0   EPINEPHrine 0.3 mg/0.3 mL IJ SOAJ injection, Inject 0.3 mg into the muscle as needed for anaphylaxis. (Patient not taking: Reported on 02/04/2022), Disp: 2 each, Rfl: 0   tirzepatide (MOUNJARO) 10 MG/0.5ML Pen, Inject 10 mg into the skin once a week., Disp: 6 mL, Rfl: 0  No Known Allergies  I personally reviewed active problem list, medication list, allergies, family history, social history, health maintenance with the patient/caregiver today.   ROS  Constitutional: Negative for fever or weight change.  Respiratory: Negative for cough and shortness of breath.   Cardiovascular: Negative for chest pain or palpitations.  Gastrointestinal: Negative for abdominal pain, no bowel changes.  Musculoskeletal: Negative for gait problem or joint swelling.  Skin: positive or rash.  Neurological: Negative for dizziness or headache.  No other specific complaints in a complete review of systems (except as listed in  HPI above).   Objective  Vitals:   05/08/22 0830  BP: 122/72  Pulse: 80  Resp: 16  SpO2: 98%  Weight: 245 lb (111.1 kg)  Height: 5\' 8"  (1.727 m)    Body mass index is 37.25 kg/m.  Physical Exam  Constitutional: Patient appears well-developed and well-nourished. Obese  No distress.  HEENT: head atraumatic, normocephalic, pupils equal and reactive to light, neck supple Cardiovascular: Normal rate, regular rhythm and normal heart sounds.  No murmur heard. No BLE edema.  Pulmonary/Chest: Effort normal and breath sounds normal. No respiratory distress. Abdominal: Soft.  There is no tenderness. Psychiatric: Patient has a normal mood and affect. behavior is normal. Judgment and thought content normal.   Recent Results (from the past 2160 hour(s))  POCT HgB A1C     Status: Abnormal   Collection Time: 05/08/22  8:32 AM  Result Value Ref Range   Hemoglobin A1C 6.3 (A) 4.0 - 5.6 %   HbA1c POC (<> result, manual entry)     HbA1c, POC (prediabetic range)     HbA1c, POC (controlled diabetic range)       PHQ2/9:    05/08/2022    8:31 AM 02/04/2022    8:22 AM 10/31/2021    8:27 AM 07/26/2021    8:14 AM 05/08/2021   10:47 AM  Depression screen PHQ 2/9  Decreased Interest 0 0 0 0 0  Down, Depressed, Hopeless 0 0 0 0 0  PHQ - 2 Score 0 0 0 0 0  Altered sleeping 0 0 0  0  Tired, decreased energy 0 0 0  0  Change in appetite 0 0 0  0  Feeling bad or failure about yourself  0 0 0  0  Trouble concentrating 0 0 0  0  Moving slowly or fidgety/restless 0 0 0  0  Suicidal thoughts 0 0 0  0  PHQ-9 Score 0 0 0  0  Difficult doing work/chores     Not difficult at all    phq 9 is negative   Fall Risk:    05/08/2022    8:31 AM 02/04/2022    8:21 AM 10/31/2021    8:27 AM 07/26/2021    8:13 AM 05/08/2021   10:47 AM  Fall Risk   Falls in the past year? 0 0 0 0 0  Number falls in past yr: 0 0 0 0   Injury with Fall? 0 0 0 0 0  Risk for fall due to : No Fall Risks No Fall Risks No Fall  Risks No Fall Risks   Follow up Falls prevention discussed Falls prevention discussed Falls prevention discussed Falls prevention discussed       Functional Status Survey: Is the patient deaf or have difficulty hearing?: No Does the patient have difficulty seeing, even when wearing glasses/contacts?: No Does the patient have difficulty concentrating, remembering, or making decisions?: No Does the patient have difficulty walking or climbing stairs?: No Does the patient have difficulty dressing or bathing?: No Does the patient have difficulty doing errands alone such as visiting a doctor's office or shopping?: No    Assessment & Plan  1. Diabetes mellitus type 2 in obese (HCC)  - POCT HgB A1C - tirzepatide (MOUNJARO) 10 MG/0.5ML Pen; Inject 10 mg into the skin once a week.  Dispense: 6 mL; Refill: 0  2. Diabetes mellitus with microalbuminuria (HCC)  - tirzepatide (MOUNJARO) 10 MG/0.5ML Pen; Inject 10 mg into the skin once a week.  Dispense: 6 mL; Refill: 0  3. Gastroesophageal reflux disease without esophagitis   4. Morbid obesity (HCC)  Discussed with the patient the risk posed by an increased BMI. Discussed importance of portion control, calorie counting and at least 150 minutes of physical activity weekly. Avoid sweet beverages and drink more water. Eat at least 6 servings of fruit and vegetables daily    5. Vitamin D insufficiency   6. HTN (hypertension), benign   7. Rhus dermatitis

## 2022-05-08 ENCOUNTER — Ambulatory Visit (INDEPENDENT_AMBULATORY_CARE_PROVIDER_SITE_OTHER): Payer: 59 | Admitting: Family Medicine

## 2022-05-08 ENCOUNTER — Encounter: Payer: Self-pay | Admitting: Family Medicine

## 2022-05-08 VITALS — BP 122/72 | HR 80 | Resp 16 | Ht 68.0 in | Wt 245.0 lb

## 2022-05-08 DIAGNOSIS — L255 Unspecified contact dermatitis due to plants, except food: Secondary | ICD-10-CM

## 2022-05-08 DIAGNOSIS — K219 Gastro-esophageal reflux disease without esophagitis: Secondary | ICD-10-CM | POA: Diagnosis not present

## 2022-05-08 DIAGNOSIS — E1169 Type 2 diabetes mellitus with other specified complication: Secondary | ICD-10-CM | POA: Diagnosis not present

## 2022-05-08 DIAGNOSIS — E669 Obesity, unspecified: Secondary | ICD-10-CM | POA: Diagnosis not present

## 2022-05-08 DIAGNOSIS — R809 Proteinuria, unspecified: Secondary | ICD-10-CM

## 2022-05-08 DIAGNOSIS — E559 Vitamin D deficiency, unspecified: Secondary | ICD-10-CM

## 2022-05-08 DIAGNOSIS — E1129 Type 2 diabetes mellitus with other diabetic kidney complication: Secondary | ICD-10-CM | POA: Diagnosis not present

## 2022-05-08 DIAGNOSIS — I10 Essential (primary) hypertension: Secondary | ICD-10-CM

## 2022-05-08 DIAGNOSIS — E119 Type 2 diabetes mellitus without complications: Secondary | ICD-10-CM

## 2022-05-08 LAB — POCT GLYCOSYLATED HEMOGLOBIN (HGB A1C): Hemoglobin A1C: 6.3 % — AB (ref 4.0–5.6)

## 2022-05-08 MED ORDER — TIRZEPATIDE 10 MG/0.5ML ~~LOC~~ SOAJ: 10.0000 mg | SUBCUTANEOUS | 0 refills | Status: DC

## 2022-05-08 MED ORDER — FLUOCINONIDE 0.1 % EX CREA: 1.0000 g | TOPICAL_CREAM | Freq: Two times a day (BID) | CUTANEOUS | 0 refills | Status: DC

## 2022-05-12 ENCOUNTER — Other Ambulatory Visit: Payer: Self-pay | Admitting: Internal Medicine

## 2022-05-12 ENCOUNTER — Other Ambulatory Visit: Payer: Self-pay | Admitting: Family Medicine

## 2022-05-12 DIAGNOSIS — E1129 Type 2 diabetes mellitus with other diabetic kidney complication: Secondary | ICD-10-CM

## 2022-05-12 DIAGNOSIS — E1169 Type 2 diabetes mellitus with other specified complication: Secondary | ICD-10-CM

## 2022-05-12 DIAGNOSIS — G8929 Other chronic pain: Secondary | ICD-10-CM

## 2022-05-12 DIAGNOSIS — M7541 Impingement syndrome of right shoulder: Secondary | ICD-10-CM

## 2022-05-14 NOTE — Telephone Encounter (Signed)
Requested medication (s) are due for refill today: no  Requested medication (s) are on the active medication list:yes  Last refill:  01/22/22  Future visit scheduled: yes  Notes to clinic:  Unable to refill per protocol, request is too soon. Last refill 01/22/22 for 90 days and 1 refill.    Requested Prescriptions  Pending Prescriptions Disp Refills   lisinopril (ZESTRIL) 5 MG tablet [Pharmacy Med Name: LISINOPRIL 5 MG TABLET] 90 tablet 1    Sig: TAKE 1 TABLET BY MOUTH EVERY DAY     Cardiovascular:  ACE Inhibitors Failed - 05/12/2022  9:15 AM      Failed - Cr in normal range and within 180 days    Creat  Date Value Ref Range Status  10/31/2021 1.03 0.60 - 1.29 mg/dL Final   Creatinine, Urine  Date Value Ref Range Status  10/31/2021 164 20 - 320 mg/dL Final         Failed - K in normal range and within 180 days    Potassium  Date Value Ref Range Status  10/31/2021 4.7 3.5 - 5.3 mmol/L Final         Passed - Patient is not pregnant      Passed - Last BP in normal range    BP Readings from Last 1 Encounters:  05/08/22 122/72         Passed - Valid encounter within last 6 months    Recent Outpatient Visits           6 days ago Diabetes mellitus type 2 in obese [facility])   [facility] Alba Cory, MD   3 months ago Diabetes mellitus type 2 in obese [facility])   [facility] Alba Cory, MD   6 months ago Diabetes mellitus with microalbuminuria [facility])   [facility] Alba Cory, MD   9 months ago Diabetes mellitus type 2 in obese [facility])   [facility] Alba Cory, MD   1 year ago COVID-19   [facility] Gabriel Cirri, NP       Future Appointments             In 2 months Alba Cory, MD [facility], [facility]   In 3 months Alba Cory, MD [facility], [facility]

## 2022-06-20 ENCOUNTER — Telehealth: Payer: Self-pay | Admitting: Family Medicine

## 2022-06-20 NOTE — Telephone Encounter (Signed)
Copied from CRM [id number]. Topic: General - Other >> Jun 20, 2022 12:03 PM Everette C wrote: Reason for CRM: The patient would like for their PCP to contact their insurance provider [employer] [facility] regarding their prescription for tirzepatide [facility]) 10 MG/0.5ML Pen [[id number]]   The patient has recently switched insurance provider and would like their new insurance to be notified of this prescription   [employer] / [facility] can be contacted at [phone]  Please contact the patient further when possible

## 2022-06-21 ENCOUNTER — Other Ambulatory Visit: Payer: Self-pay | Admitting: Family Medicine

## 2022-06-21 DIAGNOSIS — R809 Proteinuria, unspecified: Secondary | ICD-10-CM

## 2022-06-21 DIAGNOSIS — E1169 Type 2 diabetes mellitus with other specified complication: Secondary | ICD-10-CM

## 2022-06-21 MED ORDER — TIRZEPATIDE 10 MG/0.5ML ~~LOC~~ SOAJ
10.0000 mg | SUBCUTANEOUS | 0 refills | Status: DC
  Filled 2022-07-09 – 2022-07-15 (×2): qty 2, 28d supply, fill #0

## 2022-06-21 NOTE — Telephone Encounter (Signed)
Called patient and let him know to bring his insurance card in to update our system. I let him know his prescription was sent and that I recommend he take his new insurance card to the pharmacy as well so they would run it incase they need to.

## 2022-07-04 NOTE — Telephone Encounter (Signed)
Patient was informed by pharmacist tirzepatide [name]) 10 MG/0.5ML Pen was denied. Patient would like to know if PCP has any samples and can send an alternate that would be cover by insurance. Patient also would like to know if there is any samples. Patient has been of medication for 2 weeks.     [id number] [place], [place] - [address]  [address], [place] [address]  Phone:  [phone]  Fax:  [phone]

## 2022-07-05 NOTE — Telephone Encounter (Signed)
Left voicemail relying information.

## 2022-07-09 ENCOUNTER — Other Ambulatory Visit: Payer: Self-pay

## 2022-07-10 ENCOUNTER — Other Ambulatory Visit: Payer: Self-pay

## 2022-07-10 DIAGNOSIS — E669 Obesity, unspecified: Secondary | ICD-10-CM

## 2022-07-10 DIAGNOSIS — E1129 Type 2 diabetes mellitus with other diabetic kidney complication: Secondary | ICD-10-CM

## 2022-07-10 MED ORDER — TRULICITY 3 MG/0.5ML ~~LOC~~ SOAJ
3.0000 mg | SUBCUTANEOUS | 0 refills | Status: DC
  Filled 2022-07-10 – 2022-07-18 (×3): qty 2, 28d supply, fill #0

## 2022-07-11 ENCOUNTER — Other Ambulatory Visit: Payer: Self-pay

## 2022-07-15 ENCOUNTER — Other Ambulatory Visit: Payer: Self-pay

## 2022-07-18 ENCOUNTER — Other Ambulatory Visit: Payer: Self-pay

## 2022-07-19 ENCOUNTER — Other Ambulatory Visit: Payer: Self-pay

## 2022-07-22 NOTE — Patient Instructions (Signed)

## 2022-07-22 NOTE — Progress Notes (Unsigned)
Name: Trevor Dennis.   MRN: [id number]    DOB: 06-07-1971   Date:07/22/2022       Progress Note  Subjective  Chief Complaint  Annual Exam  HPI  Patient presents for annual CPE.  IPSS Questionnaire (AUA-7): Over the past month.   1)  How often have you had a sensation of not emptying your bladder completely after you finish urinating?  {Rating:19227}  2)  How often have you had to urinate again less than two hours after you finished urinating? {Rating:19227}  3)  How often have you found you stopped and started again several times when you urinated?  {Rating:19227}  4) How difficult have you found it to postpone urination?  {Rating:19227}  5) How often have you had a weak urinary stream?  {Rating:19227}  6) How often have you had to push or strain to begin urination?  {Rating:19227}  7) How many times did you most typically get up to urinate from the time you went to bed until the time you got up in the morning?  {Rating:19228}  Total score:  0-7 mildly symptomatic   8-19 moderately symptomatic   20-35 severely symptomatic     Diet: *** Exercise: *** Last Dental Exam: **** Last Eye Exam: ***  Depression: phq 9 is {gen pos neg:315643}    05/08/2022    8:31 AM 02/04/2022    8:22 AM 10/31/2021    8:27 AM 07/26/2021    8:14 AM 05/08/2021   10:47 AM  Depression screen PHQ 2/9  Decreased Interest 0 0 0 0 0  Down, Depressed, Hopeless 0 0 0 0 0  PHQ - 2 Score 0 0 0 0 0  Altered sleeping 0 0 0  0  Tired, decreased energy 0 0 0  0  Change in appetite 0 0 0  0  Feeling bad or failure about yourself  0 0 0  0  Trouble concentrating 0 0 0  0  Moving slowly or fidgety/restless 0 0 0  0  Suicidal thoughts 0 0 0  0  PHQ-9 Score 0 0 0  0  Difficult doing work/chores     Not difficult at all    Hypertension:  BP Readings from Last 3 Encounters:  05/08/22 122/72  02/04/22 120/70  10/31/21 126/84    Obesity: Wt Readings from Last 3 Encounters:  05/08/22 245 lb (111.1 kg)   02/04/22 248 lb (112.5 kg)  10/31/21 254 lb (115.2 kg)   BMI Readings from Last 3 Encounters:  05/08/22 37.25 kg/m  02/04/22 37.71 kg/m  10/31/21 38.62 kg/m     Lipids:  Lab Results  Component Value Date   CHOL 165 10/31/2021   CHOL 163 10/05/2020   CHOL 144 10/05/2019   Lab Results  Component Value Date   HDL 54 10/31/2021   HDL 61 10/05/2020   HDL 56 10/05/2019   Lab Results  Component Value Date   LDLCALC 86 10/31/2021   LDLCALC 79 10/05/2020   LDLCALC 69 10/05/2019   Lab Results  Component Value Date   TRIG 148 10/31/2021   TRIG 131 10/05/2020   TRIG 102 10/05/2019   Lab Results  Component Value Date   CHOLHDL 3.1 10/31/2021   CHOLHDL 2.7 10/05/2020   CHOLHDL 2.6 10/05/2019   No results found for: "LDLDIRECT" Glucose:  Glucose, Bld  Date Value Ref Range Status  10/31/2021 154 (H) 65 - 99 mg/dL Final    Comment:    .  Fasting reference interval . For someone without known diabetes, a glucose value >125 mg/dL indicates that they may have diabetes and this should be confirmed with a follow-up test. .   10/05/2020 152 (H) 65 - 99 mg/dL Final    Comment:    .            Fasting reference interval . For someone without known diabetes, a glucose value >125 mg/dL indicates that they may have diabetes and this should be confirmed with a follow-up test. .   10/05/2019 169 (H) 65 - 99 mg/dL Final    Comment:    .            Fasting reference interval . For someone without known diabetes, a glucose value >125 mg/dL indicates that they may have diabetes and this should be confirmed with a follow-up test. .     Flow[facility] Row [facility]  AUDIT-C Score 0      Divorced STD testing and prevention (HIV/chl/gon/syphilis): 10/05/19 Sexual history:  Hep C Screening: 05/27/17 Skin cancer: Discussed monitoring for atypical lesions Colorectal cancer: N/A Prostate cancer:   Lab  Results  Component Value Date   PSA 0.5 01/23/2017     Lung cancer:  Low Dose CT Chest recommended if Age 63-80 years, 30 pack-year currently smoking OR have quit w/in 15years. Patient  no a candidate for screening   AAA: The USPSTF recommends one-time screening with ultrasonography in men ages 4165 to 75 years who have ever smoked. Patient   no, a candidate for screening  ECG:  01/24/10  Vaccines:   HPV: N/A Tdap: up to date Shingrix: N/A Pneumonia: N/A Flu: 2014, never COVID-19: never  Advanced Care Planning: A voluntary discussion about advance care planning including the explanation and discussion of advance directives.  Discussed health care proxy and Living will, and the patient was able to identify a health care proxy as ***.  Patient does not have a living will and power of attorney of health care   Patient Active Problem List   Diagnosis Date Noted   Osteoarthritis, generalized 07/26/2021   HTN (hypertension), benign 07/26/2021   Chronic neck pain 11/17/2018   History of lumbar discectomy 03/04/2018   Hypotestosteronism 11/26/2017   History of subarachnoid hemorrhage 09/17/2017   Vitamin D insufficiency 08/26/2017   GERD (gastroesophageal reflux disease) 10/25/2016   Morbid obesity (HCC) 10/25/2016   ED (erectile dysfunction) of organic origin 02/20/2016   Anxiety 01/01/2016   Elevated liver enzymes 07/27/2015   Intermittent low back pain 07/27/2015   Fatty liver 05/03/2015   Dyslipidemia associated with type 2 diabetes mellitus (HCC) 04/24/2015   HLD (hyperlipidemia) 04/24/2015   Fever blister 04/24/2015    Past Surgical History:  Procedure Laterality Date   BACK SURGERY  8 years ago    Family History  Problem Relation Age of Onset   Diabetes Mother    Liver disease Brother    Alcohol abuse Brother    ADD / ADHD Son    ADD / ADHD Son     Social History   Socioeconomic History   Marital status: Divorced    Spouse name: Not on file   Number of  children: 2   Years of education: Not on file   Highest education level: Associate degree: occupational, Scientist, product/process developmenttechnical, or vocational program  Occupational History   Occupation: Owner    Comment: co-owner   Tobacco Use   Smoking status: Former  Packs/day: 0.25    Types: Cigarettes    Start date: 10/14/1989    Quit date: 05/15/2021    Years since quitting: 1.1   Smokeless tobacco: Never   Tobacco comments:    Only smoking cigars occasionally  Vaping Use   Vaping Use: Former  Substance and Sexual Activity   Alcohol use: Yes    Alcohol/week: 0.0 standard drinks of alcohol    Comment: Occasional    Drug use: No   Sexual activity: Yes    Partners: Female    Birth control/protection: Surgical  Other Topics Concern   Not on file  Social History Narrative   Divorced, ex wife used to cheat on him and also was an alcoholic    He has joint custody of his younger son. Her drinking is under better control   Older son is 65 and in the [employer]    Social Determinants of Health   Financial Resource Strain: Low Risk  (01/06/2020)   Overall Financial Resource Strain (CARDIA)    Difficulty of Paying Living Expenses: Not hard at all  Food Insecurity: No Food Insecurity (01/06/2020)   Hunger Vital Sign    Worried About Running Out of Food in the Last Year: Never true    Ran Out of Food in the Last Year: Never true  Transportation Needs: No Transportation Needs (01/06/2020)   PRAPARE - Hydrologist (Medical): No    Lack of Transportation (Non-Medical): No  Physical Activity: Sufficiently Active (05/08/2022)   Exercise Vital Sign    Days of Exercise per Week: 3 days    Minutes of Exercise per Session: 70 min  Stress: Stress Concern Present (01/06/2020)   [place]    Feeling of Stress : To some extent  Social Connections: Moderately Isolated (01/06/2020)   Social Connection and Isolation Panel [NHANES]     Frequency of Communication with Friends and Family: More than three times a week    Frequency of Social Gatherings with Friends and Family: Twice a week    Attends Religious Services: 1 to 4 times per year    Active Member of [employer] or Organizations: Patient refused    Attends Archivist Meetings: Never    Marital Status: Divorced  Human resources officer Violence: Not At Risk (01/06/2020)   Humiliation, Afraid, Rape, and Kick questionnaire    Fear of Current or Ex-Partner: No    Emotionally Abused: No    Physically Abused: No    Sexually Abused: No     Current Outpatient Medications:    ALPRAZolam (XANAX) 0.5 MG tablet, Take 1 tablet (0.5 mg total) by mouth daily as needed., Disp: 30 tablet, Rfl: 0   cetirizine (ZYRTEC) 10 MG tablet, TAKE 1 TABLET BY MOUTH EVERY DAY, Disp: 60 tablet, Rfl: 1   Dulaglutide (TRULICITY) 3 0000000 SOPN, Inject 3 mg into the skin as directed once a week., Disp: 2 mL, Rfl: 0   EPINEPHrine 0.3 mg/0.3 mL IJ SOAJ injection, Inject 0.3 mg into the muscle as needed for anaphylaxis. (Patient not taking: Reported on 02/04/2022), Disp: 2 each, Rfl: 0   esomeprazole (NEXIUM) 40 MG capsule, Take 1 capsule (40 mg total) by mouth in the morning., Disp: 90 capsule, Rfl: 0   Fluocinonide 0.1 % CREA, Apply 1 g topically 2 (two) times daily., Disp: 120 g, Rfl: 0   lisinopril (ZESTRIL) 5 MG tablet, TAKE 1 TABLET BY MOUTH EVERY DAY, Disp: 90 tablet,  Rfl: 1   lovastatin (MEVACOR) 40 MG tablet, TAKE 1 TABLET BY MOUTH EVERYDAY AT BEDTIME, Disp: 90 tablet, Rfl: 0   meloxicam (MOBIC) 15 MG tablet, TAKE 1 TABLET (15 MG TOTAL) BY MOUTH DAILY., Disp: 90 tablet, Rfl: 1   metFORMIN (GLUCOPHAGE-XR) 750 MG 24 hr tablet, One daily, Disp: 90 tablet, Rfl: 0   Multiple Vitamin (MULTI VITAMIN DAILY PO), Take by mouth daily. , Disp: , Rfl:    pioglitazone (ACTOS) 15 MG tablet, TAKE 1 TABLET (15 MG TOTAL) BY MOUTH DAILY., Disp: 90 tablet, Rfl: 1   tirzepatide (MOUNJARO) 10 MG/0.5ML Pen,  Inject 10 mg into the skin once a week., Disp: 6 mL, Rfl: 0   triamcinolone cream (KENALOG) 0.1 %, Apply 1 application topically 3 (three) times daily., Disp: 453.6 g, Rfl: 0   valACYclovir (VALTREX) 1000 MG tablet, Take 1 tablet (1,000 mg total) by mouth 2 (two) times daily., Disp: 20 tablet, Rfl: 0  No Known Allergies   ROS  ***   Objective  There were no vitals filed for this visit.  There is no height or weight on file to calculate BMI.  Physical Exam ***  Recent Results (from the past 2160 hour(s))  POCT HgB A1C     Status: Abnormal   Collection Time: 05/08/22  8:32 AM  Result Value Ref Range   Hemoglobin A1C 6.3 (A) 4.0 - 5.6 %   HbA1c POC (<> result, manual entry)     HbA1c, POC (prediabetic range)     HbA1c, POC (controlled diabetic range)       Fall Risk:    05/08/2022    8:31 AM 02/04/2022    8:21 AM 10/31/2021    8:27 AM 07/26/2021    8:13 AM 05/08/2021   10:47 AM  Fall Risk   Falls in the past year? 0 0 0 0 0  Number falls in past yr: 0 0 0 0   Injury with Fall? 0 0 0 0 0  Risk for fall due to : No Fall Risks No Fall Risks No Fall Risks No Fall Risks   Follow up Falls prevention discussed Falls prevention discussed Falls prevention discussed Falls prevention discussed      Functional Status Survey:      Assessment & Plan  1. Well adult exam ***    -Prostate cancer screening and PSA options (with potential risks and benefits of testing vs not testing) were discussed along with recent recs/guidelines. -USPSTF grade A and B recommendations reviewed with patient; age-appropriate recommendations, preventive care, screening tests, etc discussed and encouraged; healthy living encouraged; see AVS for patient education given to patient -Discussed importance of 150 minutes of physical activity weekly, eat two servings of fish weekly, eat one serving of tree nuts ( cashews, pistachios, pecans, almonds.Marland Kitchen) every other day, eat 6 servings of fruit/vegetables  daily and drink plenty of water and avoid sweet beverages.  -Reviewed Health Maintenance: yes

## 2022-07-23 ENCOUNTER — Encounter: Payer: Self-pay | Admitting: Family Medicine

## 2022-07-23 ENCOUNTER — Ambulatory Visit (INDEPENDENT_AMBULATORY_CARE_PROVIDER_SITE_OTHER): Payer: 59 | Admitting: Family Medicine

## 2022-07-23 VITALS — BP 126/70 | HR 82 | Resp 16 | Ht 68.0 in | Wt 246.0 lb

## 2022-07-23 DIAGNOSIS — Z1211 Encounter for screening for malignant neoplasm of colon: Secondary | ICD-10-CM | POA: Diagnosis not present

## 2022-07-23 DIAGNOSIS — Z0000 Encounter for general adult medical examination without abnormal findings: Secondary | ICD-10-CM

## 2022-07-24 ENCOUNTER — Telehealth: Payer: Self-pay | Admitting: Family Medicine

## 2022-07-24 ENCOUNTER — Other Ambulatory Visit: Payer: Self-pay

## 2022-07-24 NOTE — Telephone Encounter (Signed)
Copied from [place] [id number]. Topic: General - Other >> Jul 24, 2022 11:02 AM Ludger Nutting wrote: Patient states Dr. Ancil Boozer was going to check to see if there was a sample of Ozempic. Please advise.

## 2022-07-25 ENCOUNTER — Other Ambulatory Visit: Payer: Self-pay | Admitting: Family Medicine

## 2022-07-25 MED ORDER — SEMAGLUTIDE (1 MG/DOSE) 4 MG/3ML ~~LOC~~ SOPN: 1.0000 mg | PEN_INJECTOR | SUBCUTANEOUS | 0 refills | Status: DC

## 2022-07-26 NOTE — Telephone Encounter (Signed)
Called patient and informed him of Ozempic being sent in and waiting to do a PA. He is aware there is a sample for him whenever he's able to come pick it up. He gave verbal understanding.

## 2022-07-27 ENCOUNTER — Other Ambulatory Visit: Payer: Self-pay | Admitting: Family Medicine

## 2022-07-27 DIAGNOSIS — E1169 Type 2 diabetes mellitus with other specified complication: Secondary | ICD-10-CM

## 2022-07-27 DIAGNOSIS — E7800 Pure hypercholesterolemia, unspecified: Secondary | ICD-10-CM

## 2022-08-01 DIAGNOSIS — Z1211 Encounter for screening for malignant neoplasm of colon: Secondary | ICD-10-CM | POA: Diagnosis not present

## 2022-08-09 ENCOUNTER — Other Ambulatory Visit: Payer: Self-pay | Admitting: Family Medicine

## 2022-08-09 ENCOUNTER — Other Ambulatory Visit: Payer: Self-pay

## 2022-08-09 DIAGNOSIS — K219 Gastro-esophageal reflux disease without esophagitis: Secondary | ICD-10-CM

## 2022-08-09 DIAGNOSIS — M7541 Impingement syndrome of right shoulder: Secondary | ICD-10-CM

## 2022-08-09 DIAGNOSIS — G8929 Other chronic pain: Secondary | ICD-10-CM

## 2022-08-09 LAB — COLOGUARD: COLOGUARD: NEGATIVE

## 2022-08-09 NOTE — Telephone Encounter (Signed)
Next appt 1/17

## 2022-08-14 ENCOUNTER — Ambulatory Visit: Payer: 59 | Admitting: Family Medicine

## 2022-08-14 ENCOUNTER — Other Ambulatory Visit: Payer: Self-pay

## 2022-08-15 ENCOUNTER — Other Ambulatory Visit: Payer: Self-pay | Admitting: Family Medicine

## 2022-08-15 ENCOUNTER — Other Ambulatory Visit: Payer: Self-pay

## 2022-08-15 DIAGNOSIS — K219 Gastro-esophageal reflux disease without esophagitis: Secondary | ICD-10-CM

## 2022-08-15 MED ORDER — ESOMEPRAZOLE MAGNESIUM 40 MG PO CPDR
40.0000 mg | DELAYED_RELEASE_CAPSULE | Freq: Every morning | ORAL | 0 refills | Status: DC
  Filled 2022-08-15 – 2022-08-16 (×2): qty 30, 30d supply, fill #0
  Filled 2022-08-19: qty 90, 90d supply, fill #0
  Filled 2022-08-21: qty 30, 30d supply, fill #0
  Filled 2022-09-13: qty 30, 30d supply, fill #1
  Filled 2022-10-14: qty 30, 30d supply, fill #2

## 2022-08-16 ENCOUNTER — Other Ambulatory Visit: Payer: Self-pay

## 2022-08-19 ENCOUNTER — Other Ambulatory Visit: Payer: Self-pay

## 2022-08-19 NOTE — Progress Notes (Unsigned)
Name: Trevor Dennis.   MRN: [id number]    DOB: 07/09/1971   Date:08/20/2022       Progress Note  Subjective  Chief Complaint  Follow up   HPI  Diabetes Type 2 : A1C  January 2020 was 8%, A1C has gone as high at 9.7%, but has been better controlled lately, today A1C is 6.8 %.He has taken Trulicity, Ozempic and Mounjarno in the past but due to cost had to be off medication for a period of time, currently back on Ozempic 0.5 mg sample and only has one dose left. He states insurance said they will pay for Trulicity. We will increase Metformin to 1500 mg and continue Actos 15mg  for now, if glucose improves we will Stop Actos next visit.  He is still  playing disk golf , using portion control .He denies polyphagia, polydipsia or polyuria. He has obesity, fatty liver, dyslipidemia, microalbuminuria.  ED  associated with DM. He also takes lisinopril  for microalbuminuria and last level was normal , last LDL went up a little  he has been compliant with medication , we will recheck next visit    Chronic neck/Back pain/OA knee and elbow: he went to [facility] but out of network, he wen to [place], he states he was told he has OA. He states he is aching all the time, he takes Tylenol prn and Meloxicam daily  ( discussed risk of kidney damage) , pain worse with colder weather    Fatty liver; he had [place] in 2018, his last liver enzymes were back to normal. Unchanged    Hyperlipidemia: his LDL was reviewed and at goal, last LDL was up to 86 , he has been taking Lovastatin  daily since last level and we will recheck it next visit    Morbid Obesity: BMI over 35 with co-morbidities such as DM, HTN and dyslipidemia,  weight is stable, he is trying to lose weight again, with portion control and physical activity   GERD: . Controlled now, usually PPI in am, only takes H2 blocker prn at night but not lately . Unchanged    Snoring: his 34 yo son states snoring not as bad lately. His ESS was 10 back in Nov 2019, he  states still wakes up feeling tired sometimes, he is not interested in doing a sleep study, due to cost.  Left femur mass: he saw oncologist and negative evaluation Unchanged   Hypogonadism: levels were low in the past , feels tired, he does not have libido, he also has ED. Currently dating , medication causes him to flush and does not like it Discussed referral to Urologist but he is not interested at this time. Unchanged   Patient Active Problem List   Diagnosis Date Noted   Osteoarthritis, generalized 07/26/2021   HTN (hypertension), benign 07/26/2021   Chronic neck pain 11/17/2018   History of lumbar discectomy 03/04/2018   Hypotestosteronism 11/26/2017   History of subarachnoid hemorrhage 09/17/2017   Vitamin D insufficiency 08/26/2017   GERD (gastroesophageal reflux disease) 10/25/2016   Morbid obesity ([place]) 10/25/2016   ED (erectile dysfunction) of organic origin 02/20/2016   Anxiety 01/01/2016   Elevated liver enzymes 07/27/2015   Intermittent low back pain 07/27/2015   Fatty liver 05/03/2015   Dyslipidemia associated with type 2 diabetes mellitus ([place]) 04/24/2015   HLD (hyperlipidemia) 04/24/2015   Fever blister 04/24/2015    Past Surgical History:  Procedure Laterality Date   BACK SURGERY  8 years ago  Family History  Problem Relation Age of Onset   Diabetes Mother    Liver disease Brother    Alcohol abuse Brother    ADD / ADHD Son    ADD / ADHD Son     Social History   Tobacco Use   Smoking status: Former    Packs/day: 0.25    Types: Cigarettes    Start date: 10/14/1989    Quit date: 05/15/2021    Years since quitting: 1.2   Smokeless tobacco: Never   Tobacco comments:    Only smoking cigars occasionally  Substance Use Topics   Alcohol use: Yes    Alcohol/week: 0.0 standard drinks of alcohol    Comment: Occasional      Current Outpatient Medications:    ALPRAZolam (XANAX) 0.5 MG tablet, Take 1 tablet (0.5 mg total) by mouth daily as needed., Disp:  30 tablet, Rfl: 0   cetirizine (ZYRTEC) 10 MG tablet, TAKE 1 TABLET BY MOUTH EVERY DAY, Disp: 60 tablet, Rfl: 1   EPINEPHrine 0.3 mg/0.3 mL IJ SOAJ injection, Inject 0.3 mg into the muscle as needed for anaphylaxis., Disp: 2 each, Rfl: 0   esomeprazole (NEXIUM) 40 MG capsule, Take 1 capsule (40 mg total) by mouth in the morning., Disp: 90 capsule, Rfl: 0   Fluocinonide 0.1 % CREA, Apply 1 g topically 2 (two) times daily., Disp: 120 g, Rfl: 0   lisinopril (ZESTRIL) 5 MG tablet, TAKE 1 TABLET BY MOUTH EVERY DAY, Disp: 90 tablet, Rfl: 1   lovastatin (MEVACOR) 40 MG tablet, TAKE 1 TABLET BY MOUTH EVERYDAY AT BEDTIME, Disp: 90 tablet, Rfl: 0   meloxicam (MOBIC) 15 MG tablet, TAKE 1 TABLET (15 MG TOTAL) BY MOUTH DAILY., Disp: 90 tablet, Rfl: 0   metFORMIN (GLUCOPHAGE-XR) 750 MG 24 hr tablet, One daily, Disp: 90 tablet, Rfl: 0   Multiple Vitamin (MULTI VITAMIN DAILY PO), Take by mouth daily. , Disp: , Rfl:    pioglitazone (ACTOS) 15 MG tablet, TAKE 1 TABLET (15 MG TOTAL) BY MOUTH DAILY., Disp: 90 tablet, Rfl: 1   Semaglutide, 1 MG/DOSE, 4 MG/3ML SOPN, Inject 1 mg as directed once a week., Disp: 9 mL, Rfl: 0   valACYclovir (VALTREX) 1000 MG tablet, Take 1 tablet (1,000 mg total) by mouth 2 (two) times daily., Disp: 20 tablet, Rfl: 0  No Known Allergies  I personally reviewed active problem list, medication list, allergies, family history, social history, health maintenance with the patient/caregiver today.   ROS  Constitutional: Negative for fever or weight change.  Respiratory: Negative for cough and shortness of breath.   Cardiovascular: Negative for chest pain or palpitations.  Gastrointestinal: Negative for abdominal pain, no bowel changes.  Musculoskeletal: positive  for gait problem and intermittent  joint swelling.  Skin: Negative for rash.  Neurological: Negative for dizziness or headache.  No other specific complaints in a complete review of systems (except as listed in HPI above).    Objective  Vitals:   08/20/22 0926  BP: 122/72  Pulse: 76  Resp: 16  SpO2: 97%  Weight: 247 lb (112 kg)  Height: 5\' 8"  (1.727 m)    Body mass index is 37.56 kg/m.  Physical Exam  Constitutional: Patient appears well-developed and well-nourished. Obese  No distress.  HEENT: head atraumatic, normocephalic, pupils equal and reactive to light,, neck supple Cardiovascular: Normal rate, regular rhythm and normal heart sounds.  No murmur heard. No BLE edema. Pulmonary/Chest: Effort normal and breath sounds normal. No respiratory distress. Abdominal: Soft.  There is no tenderness. Muscular skeletal: mild effusion right knee  Psychiatric: Patient has a normal mood and affect. behavior is normal. Judgment and thought content normal.   Recent Results (from the past 2160 hour(s))  Cologuard     Status: None   Collection Time: 08/01/22  1:00 AM  Result Value Ref Range   COLOGUARD Negative Negative    Comment:  NEGATIVE TEST RESULT. A negative Cologuard result indicates a low likelihood that a colorectal cancer (CRC) or advanced adenoma (adenomatous polyps with more advanced pre-malignant features)  is present. The chance that a person with a negative Cologuard test has a colorectal cancer is less than 1 in 1500 (negative predictive value >99.9%) or has an  advanced adenoma is less than  5.3% (negative predictive value 94.7%). These data are based on a prospective cross-sectional study of 10,000 individuals at average risk for colorectal cancer who were screened with both Cologuard and colonoscopy. (Imperiale T. et al, N Engl J Med 2014;370(14):1286-1297) The normal value (reference range) for this assay is negative.  COLOGUARD RE-SCREENING RECOMMENDATION: Periodic colorectal cancer screening is an important part of preventive healthcare for asymptomatic individuals at average risk for colorectal cancer.  Following a negative Cologuard result, the [place] Task Force screening guidelines recommend a Cologuard re-screening interval of 3 years.  References: American Cancer Society Guideline for Colorectal Cancer Screening: https://www.cancer.org/cancer/colon-rectal-cancer/detection-diagnosis-staging/acs-recommendations.html.; Rex DK, Boland CR, Dominitz JK, Colorectal Cancer Screening: Recommendations for Physicians and Patients from the [place] Task Force on Colorectal Cancer Screening , Am J Gastroenterology 2017; I078015.  TEST DESCRIPTION: Composite algorithmic analysis of stool DNA-biomarkers with hemoglobin immunoassay.   Quantitative values of individual biomarkers are not reportable and are not associated with individual biomarker result reference ranges. Cologuard is intended for colorectal cancer screening of adults of either sex, 31 years or older, who are at average-risk for colorectal cancer (CRC). Cologuard has been approved for use by the U.S. FDA. The performance of Cologuard was  established in a cross sectional study of average-risk adults aged 60-84. Cologuard performance in patients ages 79 to 22 years was estimated by sub-group analysis of near-age groups. Colonoscopies performed for a positive result may find as the most clinically significant lesion: colorectal cancer [4.0%], advanced adenoma (including sessile serrated polyps greater than or equal to 1cm diameter) [20%] or non- advanced adenoma [31%]; or no colorectal neoplasia [45%]. These estimates are derived from a prospective cross-sectional screening study of 10,000 individuals at average risk for colorectal cancer who were screened with both Cologuard and colonoscopy. (Imperiale T. et al, Alison Stalling J Med 2014;370(14):1286-1297.) Cologuard may produce a false negative or false positive result (no colorectal cancer or precancerous polyp present at colonoscopy follow up). A negative Cologuard test result does not guarantee the absence of CRC or advanced adenoma  (pre-cancer). The current Cologuard  screening interval is every 3 years. 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 Cologuard performance data in a 10,000 patient pivotal study using colonoscopy as the reference method can be accessed at the following location: www.exactlabs.com/results. Additional description of the Cologuard test process, warnings and precautions can be found at www.[email or web address].com.     PHQ2/9:    08/20/2022    9:26 AM 07/23/2022    8:26 AM 05/08/2022    8:31 AM 02/04/2022    8:22 AM 10/31/2021    8:27 AM  Depression screen PHQ 2/9  Decreased Interest 0 0 0 0 0  Down, Depressed, Hopeless 0 0  0 0 0  PHQ - 2 Score 0 0 0 0 0  Altered sleeping 0 0 0 0 0  Tired, decreased energy 0 0 0 0 0  Change in appetite 0 0 0 0 0  Feeling bad or failure about yourself  0 0 0 0 0  Trouble concentrating 0 0 0 0 0  Moving slowly or fidgety/restless 0 0 0 0 0  Suicidal thoughts 0 0 0 0 0  PHQ-9 Score 0 0 0 0 0    phq 9 is negative   Fall Risk:    08/20/2022    9:26 AM 07/23/2022    8:26 AM 05/08/2022    8:31 AM 02/04/2022    8:21 AM 10/31/2021    8:27 AM  Fall Risk   Falls in the past year? 0 0 0 0 0  Number falls in past yr: 0 0 0 0 0  Injury with Fall? 0 0 0 0 0  Risk for fall due to : No Fall Risks No Fall Risks No Fall Risks No Fall Risks No Fall Risks  Follow up Falls prevention discussed Falls prevention discussed Falls prevention discussed Falls prevention discussed Falls prevention discussed      Functional Status Survey: Is the patient deaf or have difficulty hearing?: No Does the patient have difficulty seeing, even when wearing glasses/contacts?: No Does the patient have difficulty concentrating, remembering, or making decisions?: No Does the patient have difficulty walking or climbing stairs?: No Does the patient have difficulty dressing or bathing?: No Does the patient have difficulty doing errands alone such as visiting a doctor's office or  shopping?: No    Assessment & Plan  1. Diabetes mellitus with microalbuminuria (HCC)  - metFORMIN (GLUCOPHAGE-XR) 750 MG 24 hr tablet; Take 2 tablets (1,500 mg total) by mouth daily with breakfast. One daily  Dispense: 180 tablet; Refill: 1  2. Pure hypercholesterolemia   3. Dyslipidemia associated with type 2 diabetes mellitus (HCC)  - metFORMIN (GLUCOPHAGE-XR) 750 MG 24 hr tablet; Take 2 tablets (1,500 mg total) by mouth daily with breakfast. One daily  Dispense: 180 tablet; Refill: 1  4. Diabetes mellitus type 2 in obese (HCC)  - POCT HgB A1C - metFORMIN (GLUCOPHAGE-XR) 750 MG 24 hr tablet; Take 2 tablets (1,500 mg total) by mouth daily with breakfast. One daily  Dispense: 180 tablet; Refill: 1  5. Colon cancer screening  He had a negative cologuard  6. Gastroesophageal reflux disease without esophagitis  Controlled   7. Morbid obesity ([place])  Continue life style modification

## 2022-08-20 ENCOUNTER — Ambulatory Visit: Payer: 59 | Admitting: Family Medicine

## 2022-08-20 ENCOUNTER — Other Ambulatory Visit: Payer: Self-pay

## 2022-08-20 ENCOUNTER — Encounter: Payer: Self-pay | Admitting: Family Medicine

## 2022-08-20 VITALS — BP 122/72 | HR 76 | Resp 16 | Ht 68.0 in | Wt 247.0 lb

## 2022-08-20 DIAGNOSIS — E785 Hyperlipidemia, unspecified: Secondary | ICD-10-CM

## 2022-08-20 DIAGNOSIS — E1129 Type 2 diabetes mellitus with other diabetic kidney complication: Secondary | ICD-10-CM | POA: Diagnosis not present

## 2022-08-20 DIAGNOSIS — E1169 Type 2 diabetes mellitus with other specified complication: Secondary | ICD-10-CM | POA: Diagnosis not present

## 2022-08-20 DIAGNOSIS — E7800 Pure hypercholesterolemia, unspecified: Secondary | ICD-10-CM

## 2022-08-20 DIAGNOSIS — E669 Obesity, unspecified: Secondary | ICD-10-CM

## 2022-08-20 DIAGNOSIS — R809 Proteinuria, unspecified: Secondary | ICD-10-CM | POA: Diagnosis not present

## 2022-08-20 DIAGNOSIS — Z1211 Encounter for screening for malignant neoplasm of colon: Secondary | ICD-10-CM | POA: Diagnosis not present

## 2022-08-20 DIAGNOSIS — K219 Gastro-esophageal reflux disease without esophagitis: Secondary | ICD-10-CM | POA: Diagnosis not present

## 2022-08-20 LAB — POCT GLYCOSYLATED HEMOGLOBIN (HGB A1C): Hemoglobin A1C: 6.8 % — AB (ref 4.0–5.6)

## 2022-08-20 LAB — HM DIABETES EYE EXAM

## 2022-08-20 MED ORDER — TRULICITY 1.5 MG/0.5ML ~~LOC~~ SOAJ
1.5000 mg | SUBCUTANEOUS | 0 refills | Status: DC
  Filled 2022-08-20: qty 2, 28d supply, fill #0
  Filled 2022-09-13: qty 2, 28d supply, fill #1
  Filled 2022-10-14: qty 2, 28d supply, fill #2

## 2022-08-20 MED ORDER — METFORMIN HCL ER 750 MG PO TB24
1500.0000 mg | ORAL_TABLET | Freq: Every day | ORAL | 1 refills | Status: DC
  Filled 2022-08-20: qty 60, 30d supply, fill #0
  Filled 2022-09-13 – 2022-10-14 (×2): qty 60, 30d supply, fill #1

## 2022-08-21 ENCOUNTER — Other Ambulatory Visit: Payer: Self-pay

## 2022-08-26 ENCOUNTER — Encounter: Payer: Self-pay | Admitting: Family Medicine

## 2022-08-27 ENCOUNTER — Telehealth: Payer: Self-pay

## 2022-08-27 NOTE — Telephone Encounter (Signed)
Called to inform patient his diabetic eye exam results came in. No diabetic retinopathy.

## 2022-09-13 ENCOUNTER — Other Ambulatory Visit: Payer: Self-pay

## 2022-11-04 ENCOUNTER — Other Ambulatory Visit: Payer: Self-pay | Admitting: Family Medicine

## 2022-11-04 DIAGNOSIS — G8929 Other chronic pain: Secondary | ICD-10-CM

## 2022-11-04 DIAGNOSIS — M7541 Impingement syndrome of right shoulder: Secondary | ICD-10-CM

## 2022-11-12 ENCOUNTER — Other Ambulatory Visit: Payer: Self-pay | Admitting: Family Medicine

## 2022-11-12 ENCOUNTER — Other Ambulatory Visit: Payer: Self-pay

## 2022-11-13 ENCOUNTER — Other Ambulatory Visit: Payer: Self-pay | Admitting: Family Medicine

## 2022-11-13 DIAGNOSIS — E1129 Type 2 diabetes mellitus with other diabetic kidney complication: Secondary | ICD-10-CM

## 2022-11-13 DIAGNOSIS — E1169 Type 2 diabetes mellitus with other specified complication: Secondary | ICD-10-CM

## 2022-11-13 MED ORDER — PIOGLITAZONE HCL 15 MG PO TABS: 15.0000 mg | ORAL_TABLET | Freq: Every day | ORAL | 0 refills | Status: DC

## 2022-11-14 ENCOUNTER — Other Ambulatory Visit: Payer: Self-pay

## 2022-11-18 ENCOUNTER — Other Ambulatory Visit: Payer: Self-pay

## 2022-11-18 ENCOUNTER — Other Ambulatory Visit: Payer: Self-pay | Admitting: Family Medicine

## 2022-11-18 DIAGNOSIS — K219 Gastro-esophageal reflux disease without esophagitis: Secondary | ICD-10-CM

## 2022-11-19 ENCOUNTER — Other Ambulatory Visit: Payer: Self-pay | Admitting: Family Medicine

## 2022-11-19 DIAGNOSIS — K219 Gastro-esophageal reflux disease without esophagitis: Secondary | ICD-10-CM

## 2022-11-20 NOTE — Progress Notes (Unsigned)
Name: Trevor Dennis.   MRN: [id number]    DOB: Mar 13, 1971   Date:11/21/2022       Progress Note  Subjective  Chief Complaint  Follow Up  HPI  Diabetes Type 2 : A1C  January 2020 was 8%, A1C has gone as high at 9.7%. His A1C went up from 6.8 % to 71 %, currently on Trulicity 1.5 mg and states diet was not great over the holidays we will adjust dose to 3 mg weekly, also 1500 mg of Metformin and Actos 15 mg. We will stop Actos if A1C goes below 6.5 %  .He denies polyphagia, polydipsia or polyuria. He has obesity, fatty liver, dyslipidemia, microalbuminuria.  ED  associated with DM. He also takes lisinopril  for microalbuminuria and last level was normal    Chronic neck/Back pain/OA knee and elbow: he went to [facility] but out of network, he wen to [place], he states he was told he has OA. He states he is aching all the time, he takes Advised him to switch and take Tylenol daily and meloxicam prn to preserve his kidneys   Fatty liver; he had [place] in [place], his last liver enzymes were back to normal, we will recheck today    Hyperlipidemia: his LDL was reviewed and at goal, last LDL was up to 86 , he has been taking Lovastatin  daily since last level and we will recheck it today    Morbid Obesity: BMI over 35 with co-morbidities such as DM, HTN and dyslipidemia,  weight is stable, he is cutting down on portions, however diet was not the best during the holiday season   GERD: . Controlled now, he is currently taking it every morning. No longer taking Pepcid at night. Denies heartburn or indigestion.    Snoring: his 31 yo son states snoring not as bad lately. His ESS was 10 back in Nov 2019, he states still wakes up feeling tired sometimes, he is not interested in doing a sleep study. Snoring mostly when sleeping on his back   Left femur mass: he saw oncologist and negative evaluation  Unchanged   Hypogonadism: levels were low in the past , feels tired, he does not have libido, he also has ED.  Currently dating , ED medication causes him to flush and does not like it Discussed referral to Urologist but he is not interested at this time.   Patient Active Problem List   Diagnosis Date Noted   Osteoarthritis, generalized 07/26/2021   HTN (hypertension), benign 07/26/2021   Chronic neck pain 11/17/2018   History of lumbar discectomy 03/04/2018   Hypotestosteronism 11/26/2017   History of subarachnoid hemorrhage 09/17/2017   Vitamin D insufficiency 08/26/2017   GERD (gastroesophageal reflux disease) 10/25/2016   Morbid obesity ([place]) 10/25/2016   ED (erectile dysfunction) of organic origin 02/20/2016   Anxiety 01/01/2016   Elevated liver enzymes 07/27/2015   Intermittent low back pain 07/27/2015   Fatty liver 05/03/2015   Dyslipidemia associated with type 2 diabetes mellitus ([place]) 04/24/2015   HLD (hyperlipidemia) 04/24/2015   Fever blister 04/24/2015    Past Surgical History:  Procedure Laterality Date   BACK SURGERY  8 years ago    Family History  Problem Relation Age of Onset   Diabetes Mother    Liver disease Brother    Alcohol abuse Brother    ADD / ADHD Son    ADD / ADHD Son     Social History   Tobacco Use  Smoking status: Former    Packs/day: 0.25    Types: Cigarettes    Start date: 10/14/1989    Quit date: 05/15/2021    Years since quitting: 1.5   Smokeless tobacco: Never   Tobacco comments:    Only smoking cigars occasionally  Substance Use Topics   Alcohol use: Yes    Alcohol/week: 0.0 standard drinks of alcohol    Comment: Occasional      Current Outpatient Medications:    ALPRAZolam (XANAX) 0.5 MG tablet, Take 1 tablet (0.5 mg total) by mouth daily as needed., Disp: 30 tablet, Rfl: 0   cetirizine (ZYRTEC) 10 MG tablet, TAKE 1 TABLET BY MOUTH EVERY DAY, Disp: 60 tablet, Rfl: 1   Dulaglutide (TRULICITY) 3 VF/6.4PP SOPN, Inject 3 mg as directed once a week., Disp: 6 mL, Rfl: 1   EPINEPHrine 0.3 mg/0.3 mL IJ SOAJ injection, Inject 0.3 mg into the  muscle as needed for anaphylaxis., Disp: 2 each, Rfl: 0   Fluocinonide 0.1 % CREA, Apply 1 g topically 2 (two) times daily., Disp: 120 g, Rfl: 0   hydrOXYzine (ATARAX) 10 MG tablet, Take 1 tablet (10 mg total) by mouth at bedtime as needed., Disp: 30 tablet, Rfl: 0   Multiple Vitamin (MULTI VITAMIN DAILY PO), Take by mouth daily. , Disp: , Rfl:    valACYclovir (VALTREX) 1000 MG tablet, Take 1 tablet (1,000 mg total) by mouth 2 (two) times daily., Disp: 20 tablet, Rfl: 0   esomeprazole (NEXIUM) 40 MG capsule, Take 1 capsule (40 mg total) by mouth in the morning., Disp: 90 capsule, Rfl: 1   lisinopril (ZESTRIL) 5 MG tablet, Take 1 tablet (5 mg total) by mouth daily., Disp: 90 tablet, Rfl: 1   lovastatin (MEVACOR) 40 MG tablet, Take 1 tablet (40 mg total) by mouth at bedtime., Disp: 90 tablet, Rfl: 1   meloxicam (MOBIC) 15 MG tablet, Take 1 tablet (15 mg total) by mouth daily as needed for pain. Take tylenol instead when possible, Disp: 90 tablet, Rfl: 0   metFORMIN (GLUCOPHAGE-XR) 750 MG 24 hr tablet, Take 2 tablets (1,500 mg total) by mouth daily with breakfast., Disp: 180 tablet, Rfl: 1   pioglitazone (ACTOS) 15 MG tablet, Take 1 tablet (15 mg total) by mouth daily., Disp: 90 tablet, Rfl: 1  No Known Allergies  I personally reviewed active problem list, medication list, allergies, family history, social history, health maintenance with the patient/caregiver today.   ROS  Constitutional: Negative for fever or weight change.  Respiratory: Negative for cough and shortness of breath.   Cardiovascular: Negative for chest pain or palpitations.  Gastrointestinal: Negative for abdominal pain, no bowel changes.  Musculoskeletal: Negative for gait problem or joint swelling.  Skin: Negative for rash.  Neurological: Negative for dizziness or headache.  No other specific complaints in a complete review of systems (except as listed in HPI above).   Objective  Vitals:   11/21/22 0859  BP: 128/74   Pulse: 81  Resp: 16  Temp: 97.7 F (36.5 C)  TempSrc: Oral  SpO2: 96%  Weight: 248 lb (112.5 kg)  Height: 5\' 8"  (1.727 m)    Body mass index is 37.71 kg/m.  Physical Exam  Constitutional: Patient appears well-developed and well-nourished. Obese  No distress.  HEENT: head atraumatic, normocephalic, pupils equal and reactive to light,, neck supple Cardiovascular: Normal rate, regular rhythm and normal heart sounds.  No murmur heard. No BLE edema. Pulmonary/Chest: Effort normal and breath sounds normal. No respiratory distress. Abdominal: Soft.  There is no tenderness. Psychiatric: Patient has a normal mood and affect. behavior is normal. Judgment and thought content normal.   PHQ2/9:    11/21/2022    8:59 AM 08/20/2022    9:26 AM 07/23/2022    8:26 AM 05/08/2022    8:31 AM 02/04/2022    8:22 AM  Depression screen PHQ 2/9  Decreased Interest 0 0 0 0 0  Down, Depressed, Hopeless 0 0 0 0 0  PHQ - 2 Score 0 0 0 0 0  Altered sleeping 0 0 0 0 0  Tired, decreased energy 0 0 0 0 0  Change in appetite 0 0 0 0 0  Feeling bad or failure about yourself  0 0 0 0 0  Trouble concentrating 0 0 0 0 0  Moving slowly or fidgety/restless 0 0 0 0 0  Suicidal thoughts 0 0 0 0 0  PHQ-9 Score 0 0 0 0 0    phq 9 is negative   Fall Risk:    11/21/2022    8:59 AM 08/20/2022    9:26 AM 07/23/2022    8:26 AM 05/08/2022    8:31 AM 02/04/2022    8:21 AM  Fall Risk   Falls in the past year? 0 0 0 0 0  Number falls in past yr:  0 0 0 0  Injury with Fall?  0 0 0 0  Risk for fall due to : No Fall Risks No Fall Risks No Fall Risks No Fall Risks No Fall Risks  Follow up Falls prevention discussed Falls prevention discussed Falls prevention discussed Falls prevention discussed Falls prevention discussed     Assessment & Plan  1. Dyslipidemia associated with type 2 diabetes mellitus (HCC)  - POCT HgB A1C - lovastatin (MEVACOR) 40 MG tablet; Take 1 tablet (40 mg total) by mouth at bedtime.   Dispense: 90 tablet; Refill: 1 - metFORMIN (GLUCOPHAGE-XR) 750 MG 24 hr tablet; Take 2 tablets (1,500 mg total) by mouth daily with breakfast.  Dispense: 180 tablet; Refill: 1 - pioglitazone (ACTOS) 15 MG tablet; Take 1 tablet (15 mg total) by mouth daily.  Dispense: 90 tablet; Refill: 1 - COMPLETE METABOLIC PANEL WITH GFR - Urine Microalbumin w/creat. ratio - Lipid panel  2. Gastroesophageal reflux disease without esophagitis  - esomeprazole (NEXIUM) 40 MG capsule; Take 1 capsule (40 mg total) by mouth in the morning.  Dispense: 90 capsule; Refill: 1  3. Diabetes mellitus with microalbuminuria (HCC)  - lisinopril (ZESTRIL) 5 MG tablet; Take 1 tablet (5 mg total) by mouth daily.  Dispense: 90 tablet; Refill: 1 - metFORMIN (GLUCOPHAGE-XR) 750 MG 24 hr tablet; Take 2 tablets (1,500 mg total) by mouth daily with breakfast.  Dispense: 180 tablet; Refill: 1 - pioglitazone (ACTOS) 15 MG tablet; Take 1 tablet (15 mg total) by mouth daily.  Dispense: 90 tablet; Refill: 1  4. Pure hypercholesterolemia  - lovastatin (MEVACOR) 40 MG tablet; Take 1 tablet (40 mg total) by mouth at bedtime.  Dispense: 90 tablet; Refill: 1  5. Diabetes mellitus type 2 in obese (HCC)  - metFORMIN (GLUCOPHAGE-XR) 750 MG 24 hr tablet; Take 2 tablets (1,500 mg total) by mouth daily with breakfast.  Dispense: 180 tablet; Refill: 1 - pioglitazone (ACTOS) 15 MG tablet; Take 1 tablet (15 mg total) by mouth daily.  Dispense: 90 tablet; Refill: 1  6. Chronic neck pain  - meloxicam (MOBIC) 15 MG tablet; Take 1 tablet (15 mg total) by mouth daily as needed for pain. Take  tylenol instead when possible  Dispense: 90 tablet; Refill: 0  7. Impingement syndrome of right shoulder  - meloxicam (MOBIC) 15 MG tablet; Take 1 tablet (15 mg total) by mouth daily as needed for pain. Take tylenol instead when possible  Dispense: 90 tablet; Refill: 0  8. Chronic pain of right knee  - meloxicam (MOBIC) 15 MG tablet; Take 1 tablet (15 mg  total) by mouth daily as needed for pain. Take tylenol instead when possible  Dispense: 90 tablet; Refill: 0  9. Anxiety  - hydrOXYzine (ATARAX) 10 MG tablet; Take 1 tablet (10 mg total) by mouth at bedtime as needed.  Dispense: 30 tablet; Refill: 0

## 2022-11-21 ENCOUNTER — Encounter: Payer: Self-pay | Admitting: Family Medicine

## 2022-11-21 ENCOUNTER — Ambulatory Visit: Payer: 59 | Admitting: Family Medicine

## 2022-11-21 ENCOUNTER — Other Ambulatory Visit: Payer: Self-pay

## 2022-11-21 VITALS — BP 128/74 | HR 81 | Temp 97.7°F | Resp 16 | Ht 68.0 in | Wt 248.0 lb

## 2022-11-21 DIAGNOSIS — R809 Proteinuria, unspecified: Secondary | ICD-10-CM | POA: Diagnosis not present

## 2022-11-21 DIAGNOSIS — M7541 Impingement syndrome of right shoulder: Secondary | ICD-10-CM

## 2022-11-21 DIAGNOSIS — E7800 Pure hypercholesterolemia, unspecified: Secondary | ICD-10-CM

## 2022-11-21 DIAGNOSIS — F419 Anxiety disorder, unspecified: Secondary | ICD-10-CM

## 2022-11-21 DIAGNOSIS — E1129 Type 2 diabetes mellitus with other diabetic kidney complication: Secondary | ICD-10-CM

## 2022-11-21 DIAGNOSIS — M25561 Pain in right knee: Secondary | ICD-10-CM | POA: Diagnosis not present

## 2022-11-21 DIAGNOSIS — E1169 Type 2 diabetes mellitus with other specified complication: Secondary | ICD-10-CM | POA: Diagnosis not present

## 2022-11-21 DIAGNOSIS — K219 Gastro-esophageal reflux disease without esophagitis: Secondary | ICD-10-CM

## 2022-11-21 DIAGNOSIS — E785 Hyperlipidemia, unspecified: Secondary | ICD-10-CM | POA: Diagnosis not present

## 2022-11-21 DIAGNOSIS — M542 Cervicalgia: Secondary | ICD-10-CM | POA: Diagnosis not present

## 2022-11-21 DIAGNOSIS — G8929 Other chronic pain: Secondary | ICD-10-CM

## 2022-11-21 DIAGNOSIS — R69 Illness, unspecified: Secondary | ICD-10-CM | POA: Diagnosis not present

## 2022-11-21 DIAGNOSIS — E669 Obesity, unspecified: Secondary | ICD-10-CM | POA: Diagnosis not present

## 2022-11-21 LAB — POCT GLYCOSYLATED HEMOGLOBIN (HGB A1C): Hemoglobin A1C: 7.1 % — AB (ref 4.0–5.6)

## 2022-11-21 MED ORDER — HYDROXYZINE HCL 10 MG PO TABS
10.0000 mg | ORAL_TABLET | Freq: Every evening | ORAL | 0 refills | Status: DC | PRN
  Filled 2022-11-21: qty 30, 30d supply, fill #0

## 2022-11-21 MED ORDER — PIOGLITAZONE HCL 15 MG PO TABS
15.0000 mg | ORAL_TABLET | Freq: Every day | ORAL | 1 refills | Status: DC
  Filled 2022-11-21 – 2023-01-09 (×5): qty 30, 30d supply, fill #0
  Filled 2023-02-04: qty 30, 30d supply, fill #1
  Filled 2023-03-11: qty 30, 30d supply, fill #2
  Filled 2023-04-08: qty 30, 30d supply, fill #3
  Filled 2023-05-07: qty 30, 30d supply, fill #4
  Filled 2023-06-02: qty 30, 30d supply, fill #5

## 2022-11-21 MED ORDER — ESOMEPRAZOLE MAGNESIUM 40 MG PO CPDR
40.0000 mg | DELAYED_RELEASE_CAPSULE | Freq: Every morning | ORAL | 1 refills | Status: DC
  Filled 2022-11-21: qty 30, 30d supply, fill #0
  Filled 2022-12-19 – 2022-12-24 (×2): qty 30, 30d supply, fill #1
  Filled 2023-01-19: qty 30, 30d supply, fill #2
  Filled 2023-02-10: qty 30, 30d supply, fill #3

## 2022-11-21 MED ORDER — LOVASTATIN 40 MG PO TABS
40.0000 mg | ORAL_TABLET | Freq: Every day | ORAL | 1 refills | Status: DC
  Filled 2022-11-21: qty 30, 30d supply, fill #0
  Filled 2022-12-19 – 2022-12-24 (×2): qty 30, 30d supply, fill #1
  Filled 2023-01-19: qty 30, 30d supply, fill #2
  Filled 2023-02-27: qty 30, 30d supply, fill #3
  Filled 2023-04-01: qty 30, 30d supply, fill #4
  Filled 2023-04-28: qty 30, 30d supply, fill #5

## 2022-11-21 MED ORDER — MELOXICAM 15 MG PO TABS
15.0000 mg | ORAL_TABLET | Freq: Every day | ORAL | 0 refills | Status: DC | PRN
  Filled 2022-11-21 – 2022-12-24 (×3): qty 30, 30d supply, fill #0
  Filled 2023-02-10: qty 30, 30d supply, fill #1
  Filled 2023-04-14: qty 30, 30d supply, fill #2

## 2022-11-21 MED ORDER — METFORMIN HCL ER 750 MG PO TB24
1500.0000 mg | ORAL_TABLET | Freq: Every day | ORAL | 1 refills | Status: DC
  Filled 2022-11-21: qty 60, 30d supply, fill #0
  Filled 2022-12-19 – 2022-12-24 (×2): qty 60, 30d supply, fill #1
  Filled 2023-01-19: qty 60, 30d supply, fill #2
  Filled 2023-02-10 – 2023-02-24 (×2): qty 60, 30d supply, fill #3

## 2022-11-21 MED ORDER — TRULICITY 3 MG/0.5ML ~~LOC~~ SOAJ
3.0000 mg | SUBCUTANEOUS | 1 refills | Status: DC
  Filled 2022-11-21: qty 2, 28d supply, fill #0
  Filled 2022-12-19 – 2022-12-24 (×2): qty 2, 28d supply, fill #1
  Filled 2023-01-16: qty 2, 28d supply, fill #2
  Filled 2023-02-10: qty 2, 28d supply, fill #3

## 2022-11-21 MED ORDER — LISINOPRIL 5 MG PO TABS
5.0000 mg | ORAL_TABLET | Freq: Every day | ORAL | 1 refills | Status: DC
  Filled 2022-11-21 – 2022-12-24 (×3): qty 30, 30d supply, fill #0
  Filled 2023-02-04: qty 30, 30d supply, fill #1
  Filled 2023-03-11: qty 30, 30d supply, fill #2
  Filled 2023-04-08: qty 30, 30d supply, fill #3

## 2022-11-22 LAB — COMPLETE METABOLIC PANEL WITH GFR
AG Ratio: 1.7 (calc) (ref 1.0–2.5)
ALT: 33 U/L (ref 9–46)
AST: 22 U/L (ref 10–35)
Albumin: 4.8 g/dL (ref 3.6–5.1)
Alkaline phosphatase (APISO): 46 U/L (ref 35–144)
BUN: 22 mg/dL (ref 7–25)
CO2: 25 mmol/L (ref 20–32)
Calcium: 10.2 mg/dL (ref 8.6–10.3)
Chloride: 104 mmol/L (ref 98–110)
Creat: 0.91 mg/dL (ref 0.70–1.30)
Globulin: 2.9 g/dL (calc) (ref 1.9–3.7)
Glucose, Bld: 154 mg/dL — ABNORMAL HIGH (ref 65–99)
Potassium: 4.8 mmol/L (ref 3.5–5.3)
Sodium: 142 mmol/L (ref 135–146)
Total Bilirubin: 0.4 mg/dL (ref 0.2–1.2)
Total Protein: 7.7 g/dL (ref 6.1–8.1)
eGFR: 103 mL/min/{1.73_m2} (ref >=60)

## 2022-11-22 LAB — LIPID PANEL
Cholesterol: 138 mg/dL (ref <200)
HDL: 58 mg/dL (ref >=40)
LDL Cholesterol (Calc): 61 mg/dL (calc)
Non-HDL Cholesterol (Calc): 80 mg/dL (calc) (ref <130)
Total CHOL/HDL Ratio: 2.4 (calc) (ref <5.0)
Triglycerides: 109 mg/dL (ref <150)

## 2022-11-22 LAB — MICROALBUMIN / CREATININE URINE RATIO
Creatinine, Urine: 125 mg/dL (ref 20–320)
Microalb Creat Ratio: 43 mcg/mg creat — ABNORMAL HIGH (ref <30)
Microalb, Ur: 5.4 mg/dL

## 2022-12-02 ENCOUNTER — Other Ambulatory Visit: Payer: Self-pay | Admitting: Family Medicine

## 2022-12-02 DIAGNOSIS — M7541 Impingement syndrome of right shoulder: Secondary | ICD-10-CM

## 2022-12-02 DIAGNOSIS — G8929 Other chronic pain: Secondary | ICD-10-CM

## 2022-12-10 ENCOUNTER — Other Ambulatory Visit: Payer: Self-pay | Admitting: Family Medicine

## 2022-12-10 DIAGNOSIS — E1169 Type 2 diabetes mellitus with other specified complication: Secondary | ICD-10-CM

## 2022-12-10 DIAGNOSIS — E1129 Type 2 diabetes mellitus with other diabetic kidney complication: Secondary | ICD-10-CM

## 2022-12-12 ENCOUNTER — Other Ambulatory Visit: Payer: Self-pay

## 2022-12-19 ENCOUNTER — Other Ambulatory Visit: Payer: Self-pay

## 2022-12-20 ENCOUNTER — Other Ambulatory Visit: Payer: Self-pay

## 2022-12-24 ENCOUNTER — Other Ambulatory Visit: Payer: Self-pay

## 2022-12-24 ENCOUNTER — Other Ambulatory Visit: Payer: Self-pay | Admitting: Family Medicine

## 2022-12-24 DIAGNOSIS — F419 Anxiety disorder, unspecified: Secondary | ICD-10-CM

## 2022-12-24 MED FILL — Hydroxyzine HCl Tab 10 MG: ORAL | 30 days supply | Qty: 30 | Fill #0 | Status: AC

## 2023-01-02 ENCOUNTER — Other Ambulatory Visit: Payer: Self-pay

## 2023-01-09 ENCOUNTER — Other Ambulatory Visit: Payer: Self-pay | Admitting: Family Medicine

## 2023-01-09 ENCOUNTER — Other Ambulatory Visit: Payer: Self-pay

## 2023-01-09 DIAGNOSIS — E669 Obesity, unspecified: Secondary | ICD-10-CM

## 2023-01-09 DIAGNOSIS — E1169 Type 2 diabetes mellitus with other specified complication: Secondary | ICD-10-CM

## 2023-01-09 DIAGNOSIS — R809 Proteinuria, unspecified: Secondary | ICD-10-CM

## 2023-01-19 ENCOUNTER — Other Ambulatory Visit: Payer: Self-pay

## 2023-01-19 ENCOUNTER — Other Ambulatory Visit: Payer: Self-pay | Admitting: Family Medicine

## 2023-01-19 DIAGNOSIS — B001 Herpesviral vesicular dermatitis: Secondary | ICD-10-CM

## 2023-01-20 ENCOUNTER — Other Ambulatory Visit: Payer: Self-pay

## 2023-01-22 MED ORDER — VALACYCLOVIR HCL 1 G PO TABS: 1000.0000 mg | ORAL_TABLET | Freq: Two times a day (BID) | ORAL | 0 refills | Status: DC

## 2023-02-10 ENCOUNTER — Other Ambulatory Visit: Payer: Self-pay

## 2023-02-10 ENCOUNTER — Other Ambulatory Visit: Payer: Self-pay | Admitting: Family Medicine

## 2023-02-10 DIAGNOSIS — F419 Anxiety disorder, unspecified: Secondary | ICD-10-CM

## 2023-02-10 MED ORDER — ALPRAZOLAM 0.5 MG PO TABS
0.5000 mg | ORAL_TABLET | Freq: Every day | ORAL | 0 refills | Status: AC | PRN
  Filled 2023-02-10: qty 30, 30d supply, fill #0

## 2023-02-21 NOTE — Progress Notes (Unsigned)
Name: Trevor Dennis.   MRN: [id number]    DOB: 03-05-1971   Date:02/24/2023       Progress Note  Subjective  Chief Complaint  Follow Up  HPI  Diabetes Type 2 : A1C  January 2020 was 8%, A1C has gone as high at 9.7%. His A1C went up from 6.8 % to 7.1 % and today is 7 % , currently on Trulicity 3 mg ,  1500 mg of Metformin and Actos 15 mg.. He denies polyphagia, polydipsia or polyuria. He has obesity, fatty liver, dyslipidemia, microalbuminuria.  ED  associated with DM. He also takes lisinopril  for microalbuminuria last level was up again at 43. He states not as compliant with his diet over the past month. He does not want to go up on dose of Trulicity due to getting nausea the first few days after he takes a shot   Chronic neck/Back pain/OA knee and elbow: he went to [facility] but out of network, he wen to [place], he states he was told he has OA. He states he is aching all the time. He has been taking Meloxicam daily to control pain, discussed risk of kidney disease with long history of NSAID's   Fatty liver; he had [place] in 2018, his last liver enzymes normalized over the past 6 months    Hyperlipidemia:last LDL was 61 , continue current medications    Morbid Obesity: BMI over 35 with co-morbidities such as DM, HTN and dyslipidemia, weight is up, he states not eating healthier again   GERD: . Controlled now, he is currently taking it every morning. No longer taking Pepcid at night. Denies heartburn or indigestion. Stable    Snoring: his 84 yo son states snoring not as bad lately. His ESS was 10 back in Nov 2019, he states still wakes up feeling tired sometimes, he is not interested in doing a sleep study. 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 Currently dating , ED medication causes him to flush and  does not like it Discussed referral to Urologist but he is not interested at this time. Unchanged   Patient Active Problem List   Diagnosis Date Noted   Diabetes mellitus with microalbuminuria (HCC) 02/24/2023   Osteoarthritis, generalized 07/26/2021   HTN (hypertension), benign 07/26/2021   Chronic neck pain 11/17/2018   History of lumbar discectomy 03/04/2018   Hypotestosteronism 11/26/2017   History of subarachnoid hemorrhage 09/17/2017   Vitamin D insufficiency 08/26/2017   GERD (gastroesophageal reflux disease) 10/25/2016   Morbid obesity (HCC) 10/25/2016   ED (erectile dysfunction) of organic origin 02/20/2016   Anxiety 01/01/2016   Elevated liver enzymes 07/27/2015   Intermittent low back pain 07/27/2015   Fatty liver 05/03/2015   Dyslipidemia associated with type 2 diabetes mellitus (HCC) 04/24/2015   HLD (hyperlipidemia) 04/24/2015   Fever blister 04/24/2015    Past Surgical History:  Procedure Laterality Date   BACK SURGERY  8 years ago    Family History  Problem Relation Age of Onset   Diabetes Mother    Liver disease Brother    Alcohol abuse Brother    ADD / ADHD Son    ADD / ADHD Son     Social History   Tobacco Use   Smoking status: Former  Packs/day: .25    Types: Cigarettes    Start date: 10/14/1989    Quit date: 05/15/2021    Years since quitting: 1.7   Smokeless tobacco: Never   Tobacco comments:    Only smoking cigars occasionally  Substance Use Topics   Alcohol use: Yes    Alcohol/week: 0.0 standard drinks of alcohol    Comment: Occasional      Current Outpatient Medications:    ALPRAZolam (XANAX) 0.5 MG tablet, Take 1 tablet (0.5 mg total) by mouth daily as needed., Disp: 30 tablet, Rfl: 0   cetirizine (ZYRTEC) 10 MG tablet, TAKE 1 TABLET BY MOUTH EVERY DAY, Disp: 60 tablet, Rfl: 1   EPINEPHrine 0.3 mg/0.3 mL IJ SOAJ injection, Inject 0.3 mg into the muscle as needed for anaphylaxis., Disp: 2 each, Rfl: 0   Fluocinonide 0.1 % CREA, Apply  1 g topically 2 (two) times daily., Disp: 120 g, Rfl: 0   hydrOXYzine (ATARAX) 10 MG tablet, Take 1 tablet (10 mg total) by mouth at bedtime as needed., Disp: 30 tablet, Rfl: 0   lisinopril (ZESTRIL) 5 MG tablet, Take 1 tablet (5 mg total) by mouth daily., Disp: 90 tablet, Rfl: 1   lovastatin (MEVACOR) 40 MG tablet, Take 1 tablet (40 mg total) by mouth at bedtime., Disp: 90 tablet, Rfl: 1   meloxicam (MOBIC) 15 MG tablet, Take 1 tablet (15 mg total) by mouth daily as needed for pain. Take tylenol instead when possible, Disp: 90 tablet, Rfl: 0   Multiple Vitamin (MULTI VITAMIN DAILY PO), Take by mouth daily. , Disp: , Rfl:    pioglitazone (ACTOS) 15 MG tablet, Take 1 tablet (15 mg total) by mouth daily., Disp: 90 tablet, Rfl: 1   valACYclovir (VALTREX) 1000 MG tablet, Take 1 tablet (1,000 mg total) by mouth 2 (two) times daily., Disp: 20 tablet, Rfl: 0   Dulaglutide (TRULICITY) 3 MG/0.5ML SOPN, Inject 3 mg as directed once a week., Disp: 6 mL, Rfl: 1   esomeprazole (NEXIUM) 40 MG capsule, Take 1 capsule (40 mg total) by mouth in the morning., Disp: 90 capsule, Rfl: 1   metFORMIN (GLUCOPHAGE-XR) 750 MG 24 hr tablet, Take 2 tablets (1,500 mg total) by mouth daily with breakfast., Disp: 180 tablet, Rfl: 1  No Known Allergies  I personally reviewed active problem list, medication list, allergies, family history, social history, health maintenance with the patient/caregiver today.   ROS  Constitutional: Negative for fever , positive for  weight change.  Respiratory: Negative for cough and shortness of breath.   Cardiovascular: Negative for chest pain or palpitations.  Gastrointestinal: Negative for abdominal pain, no bowel changes.  Musculoskeletal: Negative for gait problem or joint swelling.  Skin: Negative for rash.  Neurological: Negative for dizziness or headache.  No other specific complaints in a complete review of systems (except as listed in HPI above).   Objective  Vitals:    02/24/23 0828  BP: 126/72  Pulse: 77  Resp: 16  SpO2: 97%  Weight: 256 lb (116.1 kg)  Height: 5\' 8"  (1.727 m)    Body mass index is 38.92 kg/m.  Physical Exam  Constitutional: Patient appears well-developed and well-nourished. Obese  No distress.  HEENT: head atraumatic, normocephalic, pupils equal and reactive to light,neck supple Cardiovascular: Normal rate, regular rhythm and normal heart sounds.  No murmur heard. No BLE edema. Pulmonary/Chest: Effort normal and breath sounds normal. No respiratory distress. Abdominal: Soft.  There is no tenderness. Psychiatric: Patient has a normal mood and affect.  behavior is normal. Judgment and thought content normal.   Recent Results (from the past 2160 hour(s))  POCT HgB A1C     Status: Abnormal   Collection Time: 02/24/23  8:29 AM  Result Value Ref Range   Hemoglobin A1C 7.0 (A) 4.0 - 5.6 %   HbA1c POC (<> result, manual entry)     HbA1c, POC (prediabetic range)     HbA1c, POC (controlled diabetic range)      Diabetic Foot Exam: Diabetic Foot Exam - Simple   Simple Foot Form Visual Inspection No deformities, no ulcerations, no other skin breakdown bilaterally: Yes Sensation Testing Intact to touch and monofilament testing bilaterally: Yes Pulse Check Posterior Tibialis and Dorsalis pulse intact bilaterally: Yes Comments      PHQ2/9:    02/24/2023    8:28 AM 11/21/2022    8:59 AM 08/20/2022    9:26 AM 07/23/2022    8:26 AM 05/08/2022    8:31 AM  Depression screen PHQ 2/9  Decreased Interest 0 0 0 0 0  Down, Depressed, Hopeless 0 0 0 0 0  PHQ - 2 Score 0 0 0 0 0  Altered sleeping 0 0 0 0 0  Tired, decreased energy 0 0 0 0 0  Change in appetite 0 0 0 0 0  Feeling bad or failure about yourself  0 0 0 0 0  Trouble concentrating 0 0 0 0 0  Moving slowly or fidgety/restless 0 0 0 0 0  Suicidal thoughts 0 0 0 0 0  PHQ-9 Score 0 0 0 0 0    phq 9 is negative   Fall Risk:    02/24/2023    8:28 AM 11/21/2022    8:59 AM  08/20/2022    9:26 AM 07/23/2022    8:26 AM 05/08/2022    8:31 AM  Fall Risk   Falls in the past year? 0 0 0 0 0  Number falls in past yr: 0  0 0 0  Injury with Fall? 0  0 0 0  Risk for fall due to : No Fall Risks No Fall Risks No Fall Risks No Fall Risks No Fall Risks  Follow up Falls prevention discussed Falls prevention discussed Falls prevention discussed Falls prevention discussed Falls prevention discussed      Functional Status Survey: Is the patient deaf or have difficulty hearing?: No Does the patient have difficulty seeing, even when wearing glasses/contacts?: No Does the patient have difficulty concentrating, remembering, or making decisions?: No Does the patient have difficulty walking or climbing stairs?: No Does the patient have difficulty dressing or bathing?: No Does the patient have difficulty doing errands alone such as visiting a doctor's office or shopping?: No    Assessment & Plan  1. Dyslipidemia associated with type 2 diabetes mellitus (HCC) 1. Dyslipidemia associated with type 2 diabetes mellitus (HCC)  - POCT HgB A1C - [facility] Diabetes Foot Exam - metFORMIN (GLUCOPHAGE-XR) 750 MG 24 hr tablet; Take 2 tablets (1,500 mg total) by mouth daily with breakfast.  Dispense: 180 tablet; Refill: 1 - Dulaglutide (TRULICITY) 3 MG/0.5ML SOPN; Inject 3 mg as directed once a week.  Dispense: 6 mL; Refill: 1  2. Morbid obesity (HCC)  Discussed with the patient the risk posed by an increased BMI. Discussed importance of portion control, calorie counting and at least 150 minutes of physical activity weekly. Avoid sweet beverages and drink more water. Eat at least 6 servings of fruit and vegetables daily    3. Diabetes  mellitus with microalbuminuria (HCC)  - metFORMIN (GLUCOPHAGE-XR) 750 MG 24 hr tablet; Take 2 tablets (1,500 mg total) by mouth daily with breakfast.  Dispense: 180 tablet; Refill: 1 - Dulaglutide (TRULICITY) 3 MG/0.5ML SOPN; Inject 3 mg as directed once a week.   Dispense: 6 mL; Refill: 1  4. Gastroesophageal reflux disease without esophagitis  - esomeprazole (NEXIUM) 40 MG capsule; Take 1 capsule (40 mg total) by mouth in the morning.  Dispense: 90 capsule; Refill: 1  5. Vitamin D insufficiency  Continue vitamin D supplementation  6. Osteoarthritis, generalized  Taking meloxicam daily   7. History of lumbar discectomy   8. Snoring  - Ambulatory referral to Sleep Studies ESS positive

## 2023-02-24 ENCOUNTER — Other Ambulatory Visit: Payer: Self-pay

## 2023-02-24 ENCOUNTER — Encounter: Payer: Self-pay | Admitting: Family Medicine

## 2023-02-24 ENCOUNTER — Ambulatory Visit: Payer: 59 | Admitting: Family Medicine

## 2023-02-24 VITALS — BP 126/72 | HR 77 | Resp 16 | Ht 68.0 in | Wt 256.0 lb

## 2023-02-24 DIAGNOSIS — R809 Proteinuria, unspecified: Secondary | ICD-10-CM | POA: Diagnosis not present

## 2023-02-24 DIAGNOSIS — R0683 Snoring: Secondary | ICD-10-CM | POA: Diagnosis not present

## 2023-02-24 DIAGNOSIS — E1169 Type 2 diabetes mellitus with other specified complication: Secondary | ICD-10-CM

## 2023-02-24 DIAGNOSIS — E1129 Type 2 diabetes mellitus with other diabetic kidney complication: Secondary | ICD-10-CM | POA: Diagnosis not present

## 2023-02-24 DIAGNOSIS — Z7984 Long term (current) use of oral hypoglycemic drugs: Secondary | ICD-10-CM | POA: Diagnosis not present

## 2023-02-24 DIAGNOSIS — E785 Hyperlipidemia, unspecified: Secondary | ICD-10-CM

## 2023-02-24 DIAGNOSIS — K219 Gastro-esophageal reflux disease without esophagitis: Secondary | ICD-10-CM

## 2023-02-24 DIAGNOSIS — Z7985 Long-term (current) use of injectable non-insulin antidiabetic drugs: Secondary | ICD-10-CM | POA: Diagnosis not present

## 2023-02-24 DIAGNOSIS — M159 Polyosteoarthritis, unspecified: Secondary | ICD-10-CM | POA: Diagnosis not present

## 2023-02-24 DIAGNOSIS — Z9889 Other specified postprocedural states: Secondary | ICD-10-CM | POA: Diagnosis not present

## 2023-02-24 DIAGNOSIS — E559 Vitamin D deficiency, unspecified: Secondary | ICD-10-CM

## 2023-02-24 LAB — POCT GLYCOSYLATED HEMOGLOBIN (HGB A1C): Hemoglobin A1C: 7 % — AB (ref 4.0–5.6)

## 2023-02-24 MED ORDER — METFORMIN HCL ER 750 MG PO TB24
1500.0000 mg | ORAL_TABLET | Freq: Every day | ORAL | 1 refills | Status: DC
  Filled 2023-02-27: qty 60, 30d supply, fill #0
  Filled 2023-03-11 – 2023-04-01 (×2): qty 60, 30d supply, fill #1
  Filled 2023-04-27: qty 60, 30d supply, fill #2
  Filled 2023-05-14 – 2023-05-28 (×2): qty 60, 30d supply, fill #3

## 2023-02-24 MED ORDER — ESOMEPRAZOLE MAGNESIUM 40 MG PO CPDR
40.0000 mg | DELAYED_RELEASE_CAPSULE | Freq: Every morning | ORAL | 1 refills | Status: DC
  Filled 2023-02-24 – 2023-03-11 (×2): qty 90, 90d supply, fill #0
  Filled 2023-05-14: qty 90, 90d supply, fill #1
  Filled 2023-05-15: qty 30, 30d supply, fill #1

## 2023-02-24 MED ORDER — TRULICITY 3 MG/0.5ML ~~LOC~~ SOAJ
3.0000 mg | SUBCUTANEOUS | 1 refills | Status: DC
  Filled 2023-02-24: qty 6, 84d supply, fill #0
  Filled 2023-03-11: qty 2, 28d supply, fill #0
  Filled 2023-04-08: qty 2, 28d supply, fill #1
  Filled 2023-05-14: qty 2, 28d supply, fill #2
  Filled 2023-05-30: qty 2, 28d supply, fill #3

## 2023-02-27 ENCOUNTER — Other Ambulatory Visit: Payer: Self-pay

## 2023-03-05 DIAGNOSIS — G4730 Sleep apnea, unspecified: Secondary | ICD-10-CM | POA: Diagnosis not present

## 2023-03-11 ENCOUNTER — Other Ambulatory Visit: Payer: Self-pay

## 2023-03-11 ENCOUNTER — Other Ambulatory Visit: Payer: Self-pay | Admitting: Family Medicine

## 2023-03-11 DIAGNOSIS — F419 Anxiety disorder, unspecified: Secondary | ICD-10-CM

## 2023-03-11 MED ORDER — HYDROXYZINE HCL 10 MG PO TABS
10.0000 mg | ORAL_TABLET | Freq: Every evening | ORAL | 0 refills | Status: DC | PRN
  Filled 2023-03-11: qty 30, 30d supply, fill #0

## 2023-03-12 ENCOUNTER — Other Ambulatory Visit: Payer: Self-pay

## 2023-03-20 ENCOUNTER — Other Ambulatory Visit: Payer: Self-pay

## 2023-04-08 ENCOUNTER — Other Ambulatory Visit: Payer: Self-pay

## 2023-04-27 ENCOUNTER — Encounter: Payer: Self-pay | Admitting: Family Medicine

## 2023-04-28 ENCOUNTER — Encounter: Payer: Self-pay | Admitting: Family Medicine

## 2023-04-28 ENCOUNTER — Ambulatory Visit: Payer: 59 | Admitting: Family Medicine

## 2023-04-28 VITALS — BP 120/64 | HR 88 | Resp 16 | Ht 68.0 in | Wt 246.0 lb

## 2023-04-28 DIAGNOSIS — R438 Other disturbances of smell and taste: Secondary | ICD-10-CM | POA: Diagnosis not present

## 2023-04-28 DIAGNOSIS — I959 Hypotension, unspecified: Secondary | ICD-10-CM | POA: Diagnosis not present

## 2023-04-28 DIAGNOSIS — Z79899 Other long term (current) drug therapy: Secondary | ICD-10-CM

## 2023-04-28 LAB — CBC WITH DIFFERENTIAL/PLATELET
Absolute Monocytes: 548 cells/uL (ref 200–950)
Basophils Relative: 1.1 %
Eosinophils Relative: 2.9 %
Lymphs Abs: 2488 cells/uL (ref 850–3900)
MCH: 30.7 pg (ref 27.0–33.0)
MCV: 92.3 fL (ref 80.0–100.0)
MPV: 11.7 fL (ref 7.5–12.5)
RBC: 4.17 10*6/uL — ABNORMAL LOW (ref 4.20–5.80)
Total Lymphocyte: 28.6 %

## 2023-04-28 NOTE — Progress Notes (Signed)
Name: Trevor Dennis.   MRN: [id number]    DOB: [birth year]   Date:04/28/2023       Progress Note  Subjective  Chief Complaint  Blood Pressure  HPI  Patient states that for the past month he has noticed orthostatic changes that has been present when physically active in the heat, like playing golf, mowing the yard. He denies associated chest pain, palpitation or SOB, but feels very sweaty. He drinks about 4 bottles of water daily and works in the elements as a Designer, fashion/clothing. He states sever symptoms over the weekend. He states since last week he has stopped smoking cigars or socially smoking cigarettes, no longer drinking alcohol. He states father was diagnosed with lung cancer about  10 days ago and it was a wake up call for him.  He has tried drinking sugar free liquid IV, he states glucose has been okay, he has skipped bp medication ( lisinopril ) over the past couple of days since BP has been very low in the 90's.   Other concerns: metallic taste that is intermittent, sometimes weird sense of smell , hands and feet gets clammy and sometimes tingling.   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   Anxiety 01/01/2016   Elevated liver enzymes 07/27/2015   Intermittent low back pain 07/27/2015   Fatty liver 05/03/2015   Dyslipidemia associated with type 2 diabetes mellitus (HCC) 04/24/2015   HLD (hyperlipidemia) 04/24/2015    Fever blister 04/24/2015    Past Surgical History:  Procedure Laterality Date   BACK SURGERY  8 years ago    Family History  Problem Relation Age of Onset   Diabetes Mother    Liver disease Brother    Alcohol abuse Brother    ADD / ADHD Son    ADD / ADHD Son     Social History   Tobacco Use   Smoking status: Former    Current packs/day: 0.00    Average packs/day: 0.3 packs/day for 31.6 years (7.9 ttl pk-yrs)    Types: Cigarettes    Start date: 10/14/1989    Quit date: 05/15/2021    Years since quitting: 1.9   Smokeless tobacco: Never   Tobacco comments:    Only smoking cigars occasionally  Substance Use Topics   Alcohol use: Yes    Alcohol/week: 0.0 standard drinks of alcohol    Comment: Occasional      Current Outpatient Medications:    ALPRAZolam (XANAX) 0.5 MG tablet, Take 1 tablet (0.5 mg total) by mouth daily as needed., Disp: 30 tablet, Rfl: 0   cetirizine (ZYRTEC) 10 MG tablet, TAKE 1 TABLET BY MOUTH EVERY DAY, Disp: 60 tablet, Rfl: 1   Dulaglutide (TRULICITY) 3 MG/0.5ML SOPN, Inject 3 mg as directed once a week., Disp: 6 mL, Rfl: 1   EPINEPHrine 0.3 mg/0.3 mL IJ SOAJ injection, Inject 0.3 mg into the muscle as needed for anaphylaxis., Disp: 2 each, Rfl: 0   esomeprazole (NEXIUM)  40 MG capsule, Take 1 capsule (40 mg total) by mouth in the morning., Disp: 90 capsule, Rfl: 1   Fluocinonide 0.1 % CREA, Apply 1 g topically 2 (two) times daily., Disp: 120 g, Rfl: 0   hydrOXYzine (ATARAX) 10 MG tablet, Take 1 tablet (10 mg total) by mouth at bedtime as needed., Disp: 30 tablet, Rfl: 0   lovastatin (MEVACOR) 40 MG tablet, Take 1 tablet (40 mg total) by mouth at bedtime., Disp: 90 tablet, Rfl: 1   meloxicam (MOBIC) 15 MG tablet, Take 1 tablet (15 mg total) by mouth daily as needed for pain. Take tylenol instead when possible, Disp: 90 tablet, Rfl: 0   metFORMIN (GLUCOPHAGE-XR) 750 MG 24 hr tablet, Take 2 tablets (1,500 mg total) by mouth daily with breakfast., Disp: 180  tablet, Rfl: 1   Multiple Vitamin (MULTI VITAMIN DAILY PO), Take by mouth daily. , Disp: , Rfl:    pioglitazone (ACTOS) 15 MG tablet, Take 1 tablet (15 mg total) by mouth daily., Disp: 90 tablet, Rfl: 1   valACYclovir (VALTREX) 1000 MG tablet, Take 1 tablet (1,000 mg total) by mouth 2 (two) times daily., Disp: 20 tablet, Rfl: 0  No Known Allergies  I personally reviewed active problem list, medication list, allergies, family history, social history, health maintenance with the patient/caregiver today.   ROS  Ten systems reviewed and is negative except as mentioned in HPI    Objective  Vitals:   04/28/23 1127  BP: 120/64  Pulse: 88  Resp: 16  SpO2: 94%  Weight: 246 lb (111.6 kg)  Height: 5\' 8"  (1.727 m)    Body mass index is 37.4 kg/m.  Physical Exam  Constitutional: Patient appears well-developed and well-nourished. Obese  No distress.  HEENT: head atraumatic, normocephalic, pupils equal and reactive to light, neck supple Cardiovascular: Normal rate, regular rhythm and normal heart sounds.  No murmur heard. No BLE edema. Pulmonary/Chest: Effort normal and breath sounds normal. No respiratory distress. Abdominal: Soft.  There is no tenderness. Psychiatric: Patient has a normal mood and affect. behavior is normal. Judgment and thought content normal.   Recent Results (from the past 2160 hour(s))  POCT HgB A1C     Status: Abnormal   Collection Time: 02/24/23  8:29 AM  Result Value Ref Range   Hemoglobin A1C 7.0 (A) 4.0 - 5.6 %   HbA1c POC (<> result, manual entry)     HbA1c, POC (prediabetic range)     HbA1c, POC (controlled diabetic range)      PHQ2/9:    04/28/2023   11:27 AM 02/24/2023    8:28 AM 11/21/2022    8:59 AM 08/20/2022    9:26 AM 07/23/2022    8:26 AM  Depression screen PHQ 2/9  Decreased Interest 0 0 0 0 0  Down, Depressed, Hopeless 0 0 0 0 0  PHQ - 2 Score 0 0 0 0 0  Altered sleeping  0 0 0 0  Tired, decreased energy  0 0 0 0  Change in appetite  0  0 0 0  Feeling bad or failure about yourself   0 0 0 0  Trouble concentrating  0 0 0 0  Moving slowly or fidgety/restless  0 0 0 0  Suicidal thoughts  0 0 0 0  PHQ-9 Score  0 0 0 0    phq 9 is negative   Fall Risk:    04/28/2023   11:27 AM 02/24/2023    8:28 AM 11/21/2022  8:59 AM 08/20/2022    9:26 AM 07/23/2022    8:26 AM  Fall Risk   Falls in the past year? 0 0 0 0 0  Number falls in past yr: 0 0  0 0  Injury with Fall? 0 0  0 0  Risk for fall due to : No Fall Risks No Fall Risks No Fall Risks No Fall Risks No Fall Risks  Follow up Falls prevention discussed Falls prevention discussed Falls prevention discussed Falls prevention discussed Falls prevention discussed      Functional Status Survey: Is the patient deaf or have difficulty hearing?: No Does the patient have difficulty seeing, even when wearing glasses/contacts?: No Does the patient have difficulty concentrating, remembering, or making decisions?: No Does the patient have difficulty walking or climbing stairs?: No Does the patient have difficulty dressing or bathing?: No Does the patient have difficulty doing errands alone such as visiting a doctor's office or shopping?: No    Assessment & Plan  1. Hypotension, unspecified hypotension type  - EKG 12-Lead - CBC with Differential/Platelet - COMPLETE METABOLIC PANEL WITH GFR  Stop ACE, drink more fluids with electrolytes, get up slowly and if needed we will refer him to Cardiologist   2. Long-term use of high-risk medication  - COMPLETE METABOLIC PANEL WITH GFR - TSH  3. Metallic taste  - B12 and Folate Panel

## 2023-04-29 ENCOUNTER — Other Ambulatory Visit: Payer: Self-pay

## 2023-04-29 ENCOUNTER — Other Ambulatory Visit: Payer: Self-pay | Admitting: Family Medicine

## 2023-04-29 DIAGNOSIS — E860 Dehydration: Secondary | ICD-10-CM

## 2023-04-29 DIAGNOSIS — E875 Hyperkalemia: Secondary | ICD-10-CM

## 2023-04-29 DIAGNOSIS — D509 Iron deficiency anemia, unspecified: Secondary | ICD-10-CM

## 2023-04-29 LAB — COMPLETE METABOLIC PANEL WITH GFR
AG Ratio: 2 (calc) (ref 1.0–2.5)
ALT: 45 U/L (ref 9–46)
AST: 24 U/L (ref 10–35)
Albumin: 5.1 g/dL (ref 3.6–5.1)
Alkaline phosphatase (APISO): 48 U/L (ref 35–144)
BUN/Creatinine Ratio: 24 (calc) — ABNORMAL HIGH (ref 6–22)
BUN: 30 mg/dL — ABNORMAL HIGH (ref 7–25)
CO2: 23 mmol/L (ref 20–32)
Calcium: 10 mg/dL (ref 8.6–10.3)
Chloride: 104 mmol/L (ref 98–110)
Creat: 1.25 mg/dL (ref 0.70–1.30)
Globulin: 2.6 g/dL (calc) (ref 1.9–3.7)
Glucose, Bld: 115 mg/dL — ABNORMAL HIGH (ref 65–99)
Potassium: 5.6 mmol/L — ABNORMAL HIGH (ref 3.5–5.3)
Sodium: 137 mmol/L (ref 135–146)
Total Bilirubin: 0.5 mg/dL (ref 0.2–1.2)
Total Protein: 7.7 g/dL (ref 6.1–8.1)
eGFR: 70 mL/min/{1.73_m2} (ref >=60)

## 2023-04-29 LAB — CBC WITH DIFFERENTIAL/PLATELET
Basophils Absolute: 96 cells/uL (ref 0–200)
Eosinophils Absolute: 252 cells/uL (ref 15–500)
HCT: 38.5 % (ref 38.5–50.0)
Hemoglobin: 12.8 g/dL — ABNORMAL LOW (ref 13.2–17.1)
MCHC: 33.2 g/dL (ref 32.0–36.0)
Monocytes Relative: 6.3 %
Neutro Abs: 5316 cells/uL (ref 1500–7800)
Neutrophils Relative %: 61.1 %
Platelets: 241 10*3/uL (ref 140–400)
RDW: 12.8 % (ref 11.0–15.0)
WBC: 8.7 10*3/uL (ref 3.8–10.8)

## 2023-04-29 LAB — TSH: TSH: 3.71 mIU/L (ref 0.40–4.50)

## 2023-04-29 LAB — B12 AND FOLATE PANEL
Folate: >24 ng/mL
Vitamin B-12: 914 pg/mL (ref 200–1100)

## 2023-05-06 DIAGNOSIS — D509 Iron deficiency anemia, unspecified: Secondary | ICD-10-CM | POA: Diagnosis not present

## 2023-05-06 DIAGNOSIS — E860 Dehydration: Secondary | ICD-10-CM | POA: Diagnosis not present

## 2023-05-06 DIAGNOSIS — E875 Hyperkalemia: Secondary | ICD-10-CM | POA: Diagnosis not present

## 2023-05-07 LAB — IRON,TIBC AND FERRITIN PANEL
%SAT: 22 % (calc) (ref 20–48)
Ferritin: 48 ng/mL (ref 38–380)
Iron: 89 ug/dL (ref 50–180)
TIBC: 397 mcg/dL (calc) (ref 250–425)

## 2023-05-07 LAB — BASIC METABOLIC PANEL WITH GFR
BUN: 25 mg/dL (ref 7–25)
CO2: 25 mmol/L (ref 20–32)
Calcium: 10.1 mg/dL (ref 8.6–10.3)
Chloride: 105 mmol/L (ref 98–110)
Creat: 1.13 mg/dL (ref 0.70–1.30)
Glucose, Bld: 164 mg/dL — ABNORMAL HIGH (ref 65–99)
Potassium: 4.9 mmol/L (ref 3.5–5.3)
Sodium: 140 mmol/L (ref 135–146)
eGFR: 79 mL/min/{1.73_m2} (ref >=60)

## 2023-05-12 ENCOUNTER — Other Ambulatory Visit: Payer: Self-pay

## 2023-05-14 ENCOUNTER — Other Ambulatory Visit: Payer: Self-pay

## 2023-05-14 ENCOUNTER — Other Ambulatory Visit: Payer: Self-pay | Admitting: Family Medicine

## 2023-05-14 DIAGNOSIS — G8929 Other chronic pain: Secondary | ICD-10-CM

## 2023-05-14 DIAGNOSIS — E1169 Type 2 diabetes mellitus with other specified complication: Secondary | ICD-10-CM

## 2023-05-14 DIAGNOSIS — E7800 Pure hypercholesterolemia, unspecified: Secondary | ICD-10-CM

## 2023-05-14 DIAGNOSIS — M7541 Impingement syndrome of right shoulder: Secondary | ICD-10-CM

## 2023-05-15 ENCOUNTER — Other Ambulatory Visit: Payer: Self-pay | Admitting: Family Medicine

## 2023-05-15 ENCOUNTER — Other Ambulatory Visit: Payer: Self-pay

## 2023-05-15 DIAGNOSIS — M7541 Impingement syndrome of right shoulder: Secondary | ICD-10-CM

## 2023-05-15 DIAGNOSIS — E7800 Pure hypercholesterolemia, unspecified: Secondary | ICD-10-CM

## 2023-05-15 DIAGNOSIS — E1169 Type 2 diabetes mellitus with other specified complication: Secondary | ICD-10-CM

## 2023-05-15 DIAGNOSIS — G8929 Other chronic pain: Secondary | ICD-10-CM

## 2023-05-16 ENCOUNTER — Other Ambulatory Visit: Payer: Self-pay

## 2023-05-16 MED FILL — Lovastatin Tab 40 MG: ORAL | 30 days supply | Qty: 30 | Fill #0 | Status: AC

## 2023-05-16 MED FILL — Meloxicam Tab 15 MG: ORAL | 30 days supply | Qty: 30 | Fill #0 | Status: AC

## 2023-05-17 ENCOUNTER — Other Ambulatory Visit: Payer: Self-pay

## 2023-05-18 ENCOUNTER — Other Ambulatory Visit: Payer: Self-pay

## 2023-05-19 ENCOUNTER — Other Ambulatory Visit: Payer: Self-pay

## 2023-05-21 ENCOUNTER — Other Ambulatory Visit: Payer: Self-pay

## 2023-05-30 ENCOUNTER — Other Ambulatory Visit: Payer: Self-pay

## 2023-06-03 NOTE — Progress Notes (Unsigned)
Name: Trevor Dennis.   MRN: [id number]    DOB: [birth year]   Date:06/04/2023       Progress Note  Subjective  Chief Complaint  Follow Up  HPI  Diabetes Type 2 : A1C  January 2020 was 8%, A1C has gone as high at 9.7%. His A1C went up from 6.8 % to 7.1 , 7 % and today is up to 7.3% , currently on Trulicity 3 mg ,  1500 mg of Metformin and Actos 15 mg. He denies polyphagia, polydipsia or polyuria. He has obesity, fatty liver, dyslipidemia, microalbuminuria.  ED  associated with DM. He also takes lisinopril  for microalbuminuria last level was up again at 43. He quit smoking and has been chewing on non sugar free gum, he just switched to sugar free gum . He does not want to go up on dose of Trulicity due to getting nausea the first few days after he takes a shot.   Chronic neck/Back pain/OA knee and elbow: he went to [facility] but out of network, he wen to [place], he states he was told he has OA. He states he is aching all the time. He is only taking Meloxicam prn now , sometimes Tylenol    Fatty liver; he had [place] in 2018, his last liver enzymes normalized over the past 6 months . Stable    Hyperlipidemia:last LDL was 61 , continue current medications    Morbid Obesity: BMI over 35 with co-morbidities such as DM, HTN and dyslipidemia, weight is trending down. Cutting down on portions   GERD: . Controlled now, he is currently taking it every morning. No longer taking Pepcid at night. He states regurgitation improved with mouthguard for GERD   OSA: he finally had sleep study done in May, it was positive, orders signed but he never got the supplies. He is now wearing a mouth guard - Z-quiet that he purchased on line and is waking up feeling more refreshed   Left femur mass: he saw oncologist and negative evaluation  Unchanged   Hypogonadism: levels were low in the past , feels tired, he does not have libido, he also has ED. Currently dating , ED medication causes him to flush and does not  like it Discussed referral to Urologist but he is not interested at this time. Stable   Patient Active Problem List   Diagnosis Date Noted   Diabetes mellitus with microalbuminuria (HCC) 02/24/2023   Osteoarthritis, generalized 07/26/2021   HTN (hypertension), benign 07/26/2021   Chronic neck pain 11/17/2018   History of lumbar discectomy 03/04/2018   Hypotestosteronism 11/26/2017   History of subarachnoid hemorrhage 09/17/2017   Vitamin D insufficiency 08/26/2017   GERD (gastroesophageal reflux disease) 10/25/2016   Morbid obesity (HCC) 10/25/2016   ED (erectile dysfunction) of organic origin 02/20/2016   Anxiety 01/01/2016   Elevated liver enzymes 07/27/2015   Intermittent low back pain 07/27/2015   Fatty liver 05/03/2015   Dyslipidemia associated with type 2 diabetes mellitus (HCC) 04/24/2015   HLD (hyperlipidemia) 04/24/2015   Fever blister 04/24/2015    Past Surgical History:  Procedure Laterality Date   BACK SURGERY  8 years ago    Family History  Problem Relation Age of Onset   Diabetes Mother    Cancer Father 48       lung cancer   Lung cancer Father    Liver disease Brother    Alcohol abuse Brother    ADD / ADHD Son    ADD /  ADHD Son     Social History   Tobacco Use   Smoking status: Former    Current packs/day: 0.00    Types: Cigarettes    Quit date: 02/15/1990    Years since quitting: 33.3   Smokeless tobacco: Never  Substance Use Topics   Alcohol use: Yes    Alcohol/week: 0.0 standard drinks of alcohol    Comment: Occasional      Current Outpatient Medications:    ALPRAZolam (XANAX) 0.5 MG tablet, Take 1 tablet (0.5 mg total) by mouth daily as needed., Disp: 30 tablet, Rfl: 0   cetirizine (ZYRTEC) 10 MG tablet, TAKE 1 TABLET BY MOUTH EVERY DAY, Disp: 60 tablet, Rfl: 1   EPINEPHrine 0.3 mg/0.3 mL IJ SOAJ injection, Inject 0.3 mg into the muscle as needed for anaphylaxis., Disp: 2 each, Rfl: 0   Fluocinonide 0.1 % CREA, Apply 1 g topically 2 (two)  times daily., Disp: 120 g, Rfl: 0   hydrOXYzine (ATARAX) 10 MG tablet, Take 1 tablet (10 mg total) by mouth at bedtime as needed., Disp: 30 tablet, Rfl: 0   meloxicam (MOBIC) 15 MG tablet, Take 1 tablet (15 mg total) by mouth daily as needed for pain. Take tylenol instead when possible, Disp: 90 tablet, Rfl: 0   Multiple Vitamin (MULTI VITAMIN DAILY PO), Take by mouth daily. , Disp: , Rfl:    pioglitazone (ACTOS) 15 MG tablet, Take 1 tablet (15 mg total) by mouth daily., Disp: 90 tablet, Rfl: 1   Dulaglutide (TRULICITY) 3 MG/0.5ML SOPN, Inject 3 mg as directed once a week., Disp: 6 mL, Rfl: 1   esomeprazole (NEXIUM) 40 MG capsule, Take 1 capsule (40 mg total) by mouth in the morning., Disp: 90 capsule, Rfl: 1   lovastatin (MEVACOR) 40 MG tablet, Take 1 tablet (40 mg total) by mouth at bedtime., Disp: 90 tablet, Rfl: 0   metFORMIN (GLUCOPHAGE-XR) 750 MG 24 hr tablet, Take 2 tablets (1,500 mg total) by mouth daily with breakfast., Disp: 180 tablet, Rfl: 1   valACYclovir (VALTREX) 1000 MG tablet, Take 1 tablet (1,000 mg total) by mouth 2 (two) times daily., Disp: 20 tablet, Rfl: 0  No Known Allergies  I personally reviewed active problem list, medication list, allergies, family history, social history, health maintenance with the patient/caregiver today.   ROS  Constitutional: Negative for fever or weight change.  Respiratory: Negative for cough and shortness of breath.   Cardiovascular: Negative for chest pain or palpitations.  Gastrointestinal: Negative for abdominal pain, no bowel changes.  Musculoskeletal: Negative for gait problem or joint swelling.  Skin: Negative for rash.  Neurological: Negative for dizziness or headache.  No other specific complaints in a complete review of systems (except as listed in HPI above).   Objective  Vitals:   06/04/23 0845  BP: 124/72  Pulse: 84  Resp: 16  Temp: 97.9 F (36.6 C)  TempSrc: Oral  SpO2: 98%  Weight: 246 lb 3.2 oz (111.7 kg)   Height: 5\' 8"  (1.727 m)    Body mass index is 37.43 kg/m.  Physical Exam  Constitutional: Patient appears well-developed and well-nourished. Obese  No distress.  HEENT: head atraumatic, normocephalic, pupils equal and reactive to light, neck supple Cardiovascular: Normal rate, regular rhythm and normal heart sounds.  No murmur heard. No BLE edema. Pulmonary/Chest: Effort normal and breath sounds normal. No respiratory distress. Abdominal: Soft.  There is no tenderness. Psychiatric: Patient has a normal mood and affect. behavior is normal. Judgment and thought content normal.  PHQ2/9:    06/04/2023    8:48 AM 04/28/2023   11:27 AM 02/24/2023    8:28 AM 11/21/2022    8:59 AM 08/20/2022    9:26 AM  Depression screen PHQ 2/9  Decreased Interest 0 0 0 0 0  Down, Depressed, Hopeless 0 0 0 0 0  PHQ - 2 Score 0 0 0 0 0  Altered sleeping 0  0 0 0  Tired, decreased energy 0  0 0 0  Change in appetite 0  0 0 0  Feeling bad or failure about yourself  0  0 0 0  Trouble concentrating 0  0 0 0  Moving slowly or fidgety/restless 0  0 0 0  Suicidal thoughts 0  0 0 0  PHQ-9 Score 0  0 0 0    phq 9 is negative   Fall Risk:    06/04/2023    8:48 AM 04/28/2023   11:27 AM 02/24/2023    8:28 AM 11/21/2022    8:59 AM 08/20/2022    9:26 AM  Fall Risk   Falls in the past year? 0 0 0 0 0  Number falls in past yr:  0 0  0  Injury with Fall?  0 0  0  Risk for fall due to : No Fall Risks No Fall Risks No Fall Risks No Fall Risks No Fall Risks  Follow up Falls prevention discussed Falls prevention discussed Falls prevention discussed Falls prevention discussed Falls prevention discussed      Functional Status Survey: Is the patient deaf or have difficulty hearing?: No Does the patient have difficulty seeing, even when wearing glasses/contacts?: No Does the patient have difficulty concentrating, remembering, or making decisions?: No Does the patient have difficulty walking or climbing stairs?:  No Does the patient have difficulty dressing or bathing?: No Does the patient have difficulty doing errands alone such as visiting a doctor's office or shopping?: No    Assessment & Plan  1. Dyslipidemia associated with type 2 diabetes mellitus (HCC)  - POCT HgB A1C - metFORMIN (GLUCOPHAGE-XR) 750 MG 24 hr tablet; Take 2 tablets (1,500 mg total) by mouth daily with breakfast.  Dispense: 180 tablet; Refill: 1 - lovastatin (MEVACOR) 40 MG tablet; Take 1 tablet (40 mg total) by mouth at bedtime.  Dispense: 90 tablet; Refill: 0 - Dulaglutide (TRULICITY) 3 MG/0.5ML SOPN; Inject 3 mg as directed once a week.  Dispense: 6 mL; Refill: 1  2. Morbid obesity (HCC)  He is now sharing meals with his girlfriend, cutting down on portions  3. Diabetes mellitus with microalbuminuria (HCC)  - metFORMIN (GLUCOPHAGE-XR) 750 MG 24 hr tablet; Take 2 tablets (1,500 mg total) by mouth daily with breakfast.  Dispense: 180 tablet; Refill: 1 - Dulaglutide (TRULICITY) 3 MG/0.5ML SOPN; Inject 3 mg as directed once a week.  Dispense: 6 mL; Refill: 1  4. Gastroesophageal reflux disease without esophagitis  - esomeprazole (NEXIUM) 40 MG capsule; Take 1 capsule (40 mg total) by mouth in the morning.  Dispense: 90 capsule; Refill: 1  5. Pure hypercholesterolemia  - lovastatin (MEVACOR) 40 MG tablet; Take 1 tablet (40 mg total) by mouth at bedtime.  Dispense: 90 tablet; Refill: 0  6. Fever blister  - valACYclovir (VALTREX) 1000 MG tablet; Take 1 tablet (1,000 mg total) by mouth 2 (two) times daily.  Dispense: 20 tablet; Refill: 0  7. Vitamin D insufficiency   Taking supplementation

## 2023-06-04 ENCOUNTER — Ambulatory Visit: Payer: 59 | Admitting: Family Medicine

## 2023-06-04 ENCOUNTER — Other Ambulatory Visit: Payer: Self-pay

## 2023-06-04 ENCOUNTER — Encounter: Payer: Self-pay | Admitting: Family Medicine

## 2023-06-04 VITALS — BP 124/72 | HR 84 | Temp 97.9°F | Resp 16 | Ht 68.0 in | Wt 246.2 lb

## 2023-06-04 DIAGNOSIS — E785 Hyperlipidemia, unspecified: Secondary | ICD-10-CM

## 2023-06-04 DIAGNOSIS — E1129 Type 2 diabetes mellitus with other diabetic kidney complication: Secondary | ICD-10-CM

## 2023-06-04 DIAGNOSIS — Z7984 Long term (current) use of oral hypoglycemic drugs: Secondary | ICD-10-CM

## 2023-06-04 DIAGNOSIS — E559 Vitamin D deficiency, unspecified: Secondary | ICD-10-CM | POA: Diagnosis not present

## 2023-06-04 DIAGNOSIS — E7800 Pure hypercholesterolemia, unspecified: Secondary | ICD-10-CM

## 2023-06-04 DIAGNOSIS — R809 Proteinuria, unspecified: Secondary | ICD-10-CM | POA: Diagnosis not present

## 2023-06-04 DIAGNOSIS — G4733 Obstructive sleep apnea (adult) (pediatric): Secondary | ICD-10-CM

## 2023-06-04 DIAGNOSIS — E1169 Type 2 diabetes mellitus with other specified complication: Secondary | ICD-10-CM | POA: Diagnosis not present

## 2023-06-04 DIAGNOSIS — B001 Herpesviral vesicular dermatitis: Secondary | ICD-10-CM | POA: Diagnosis not present

## 2023-06-04 DIAGNOSIS — K219 Gastro-esophageal reflux disease without esophagitis: Secondary | ICD-10-CM | POA: Diagnosis not present

## 2023-06-04 LAB — POCT GLYCOSYLATED HEMOGLOBIN (HGB A1C): Hemoglobin A1C: 7.3 % — AB (ref 4.0–5.6)

## 2023-06-04 MED ORDER — TRULICITY 3 MG/0.5ML ~~LOC~~ SOAJ
3.0000 mg | SUBCUTANEOUS | 1 refills | Status: DC
  Filled 2023-06-04: qty 2, 28d supply, fill #0
  Filled 2023-07-04: qty 2, 28d supply, fill #1
  Filled 2023-07-31: qty 2, 28d supply, fill #2
  Filled 2023-08-28: qty 2, 28d supply, fill #3

## 2023-06-04 MED ORDER — VALACYCLOVIR HCL 1 G PO TABS
1000.0000 mg | ORAL_TABLET | Freq: Two times a day (BID) | ORAL | 0 refills | Status: DC
Start: 2023-06-04 — End: 2024-01-28
  Filled 2023-06-04: qty 20, 10d supply, fill #0

## 2023-06-04 MED ORDER — METFORMIN HCL ER 750 MG PO TB24
1500.0000 mg | ORAL_TABLET | Freq: Every day | ORAL | 1 refills | Status: DC
Start: 2023-06-04 — End: 2023-12-09
  Filled 2023-06-04 – 2023-06-23 (×2): qty 60, 30d supply, fill #0
  Filled 2023-07-31: qty 60, 30d supply, fill #1
  Filled 2023-08-28: qty 60, 30d supply, fill #2
  Filled 2023-09-24: qty 60, 30d supply, fill #3
  Filled 2023-10-22: qty 60, 30d supply, fill #4
  Filled 2023-11-13: qty 60, 30d supply, fill #5

## 2023-06-04 MED ORDER — LOVASTATIN 40 MG PO TABS
40.0000 mg | ORAL_TABLET | Freq: Every day | ORAL | 0 refills | Status: DC
Start: 2023-06-04 — End: 2023-09-04
  Filled 2023-06-04: qty 30, 30d supply, fill #0
  Filled 2023-07-31: qty 30, 30d supply, fill #1
  Filled 2023-08-28: qty 30, 30d supply, fill #2

## 2023-06-04 MED ORDER — ESOMEPRAZOLE MAGNESIUM 40 MG PO CPDR
40.0000 mg | DELAYED_RELEASE_CAPSULE | Freq: Every morning | ORAL | 1 refills | Status: DC
  Filled 2023-06-04: qty 30, 30d supply, fill #0
  Filled 2023-07-04: qty 30, 30d supply, fill #1
  Filled 2023-07-31: qty 30, 30d supply, fill #2
  Filled 2023-08-28: qty 30, 30d supply, fill #3

## 2023-06-09 ENCOUNTER — Other Ambulatory Visit: Payer: Self-pay

## 2023-06-17 ENCOUNTER — Other Ambulatory Visit: Payer: Self-pay

## 2023-06-23 ENCOUNTER — Other Ambulatory Visit: Payer: Self-pay

## 2023-06-23 MED FILL — Meloxicam Tab 15 MG: ORAL | 30 days supply | Qty: 30 | Fill #1 | Status: AC

## 2023-06-27 ENCOUNTER — Other Ambulatory Visit: Payer: Self-pay

## 2023-07-01 ENCOUNTER — Other Ambulatory Visit: Payer: Self-pay

## 2023-07-02 ENCOUNTER — Other Ambulatory Visit: Payer: Self-pay

## 2023-07-02 DIAGNOSIS — R21 Rash and other nonspecific skin eruption: Secondary | ICD-10-CM

## 2023-07-02 MED ORDER — CETIRIZINE HCL 10 MG PO TABS
10.0000 mg | ORAL_TABLET | Freq: Every day | ORAL | 0 refills | Status: DC
Start: 2023-07-02 — End: 2023-08-28
  Filled 2023-07-02: qty 30, 30d supply, fill #0
  Filled 2023-07-31: qty 30, 30d supply, fill #1

## 2023-07-04 ENCOUNTER — Other Ambulatory Visit: Payer: Self-pay

## 2023-07-04 ENCOUNTER — Other Ambulatory Visit: Payer: Self-pay | Admitting: Family Medicine

## 2023-07-04 DIAGNOSIS — E669 Obesity, unspecified: Secondary | ICD-10-CM

## 2023-07-04 DIAGNOSIS — E1169 Type 2 diabetes mellitus with other specified complication: Secondary | ICD-10-CM

## 2023-07-04 DIAGNOSIS — E1129 Type 2 diabetes mellitus with other diabetic kidney complication: Secondary | ICD-10-CM

## 2023-07-07 ENCOUNTER — Other Ambulatory Visit: Payer: Self-pay

## 2023-07-07 ENCOUNTER — Other Ambulatory Visit: Payer: Self-pay | Admitting: Nurse Practitioner

## 2023-07-07 DIAGNOSIS — R809 Proteinuria, unspecified: Secondary | ICD-10-CM

## 2023-07-07 DIAGNOSIS — E1169 Type 2 diabetes mellitus with other specified complication: Secondary | ICD-10-CM

## 2023-07-07 MED ORDER — PIOGLITAZONE HCL 15 MG PO TABS
15.0000 mg | ORAL_TABLET | Freq: Every day | ORAL | 1 refills | Status: DC
Start: 2023-07-07 — End: 2023-12-09
  Filled 2023-07-07: qty 30, 30d supply, fill #0
  Filled 2023-07-31: qty 30, 30d supply, fill #1
  Filled 2023-08-28: qty 30, 30d supply, fill #2
  Filled 2023-09-24: qty 30, 30d supply, fill #3
  Filled 2023-10-05 – 2023-10-22 (×2): qty 30, 30d supply, fill #4
  Filled 2023-11-13: qty 30, 30d supply, fill #5

## 2023-07-11 ENCOUNTER — Other Ambulatory Visit: Payer: Self-pay

## 2023-07-25 NOTE — Progress Notes (Signed)
Name: Trevor Dennis.   MRN: [id number]    DOB: [birth year]   Date:07/28/2023       Progress Note  Subjective  Chief Complaint  Annual Exam  HPI  Patient presents for annual CPE.  IPSS Questionnaire (AUA-7): Over the past month.   1)  How often have you had a sensation of not emptying your bladder completely after you finish urinating?  0 - Not at all  2)  How often have you had to urinate again less than two hours after you finished urinating? 0 - Not at all  3)  How often have you found you stopped and started again several times when you urinated?  0 - Not at all  4) How difficult have you found it to postpone urination?  1 - Less than 1 time in 5  5) How often have you had a weak urinary stream?  0 - Not at all  6) How often have you had to push or strain to begin urination?  1 - Less than 1 time in 5  7) How many times did you most typically get up to urinate from the time you went to bed until the time you got up in the morning?  1  Total score:  0-7 mildly symptomatic   8-19 moderately symptomatic   20-35 severely symptomatic     Diet: he is still not following a diabetic diet , but trying to do better  Exercise: he has an active , playing golf, disc golf Last Dental Exam: up to date  Last Eye Exam: up to date  Depression: phq 9 is negative    06/04/2023    8:48 AM 04/28/2023   11:27 AM 02/24/2023    8:28 AM 11/21/2022    8:59 AM 08/20/2022    9:26 AM  Depression screen PHQ 2/9  Decreased Interest 0 0 0 0 0  Down, Depressed, Hopeless 0 0 0 0 0  PHQ - 2 Score 0 0 0 0 0  Altered sleeping 0  0 0 0  Tired, decreased energy 0  0 0 0  Change in appetite 0  0 0 0  Feeling bad or failure about yourself  0  0 0 0  Trouble concentrating 0  0 0 0  Moving slowly or fidgety/restless 0  0 0 0  Suicidal thoughts 0  0 0 0  PHQ-9 Score 0  0 0 0    Hypertension:  BP Readings from Last 3 Encounters:  07/28/23 122/72  06/04/23 124/72  04/28/23 120/64    Obesity: Wt  Readings from Last 3 Encounters:  07/28/23 255 lb 6.4 oz (115.8 kg)  06/04/23 246 lb 3.2 oz (111.7 kg)  04/28/23 246 lb (111.6 kg)   BMI Readings from Last 3 Encounters:  07/28/23 38.83 kg/m  06/04/23 37.43 kg/m  04/28/23 37.40 kg/m     Lipids:  Lab Results  Component Value Date   CHOL 138 11/21/2022   CHOL 165 10/31/2021   CHOL 163 10/05/2020   Lab Results  Component Value Date   HDL 58 11/21/2022   HDL 54 10/31/2021   HDL 61 10/05/2020   Lab Results  Component Value Date   LDLCALC 61 11/21/2022   LDLCALC 86 10/31/2021   LDLCALC 79 10/05/2020   Lab Results  Component Value Date   TRIG 109 11/21/2022   TRIG 148 10/31/2021   TRIG 131 10/05/2020   Lab Results  Component Value Date   CHOLHDL 2.4 11/21/2022  CHOLHDL 3.1 10/31/2021   CHOLHDL 2.7 10/05/2020   No results found for: "LDLDIRECT" Glucose:  Glucose, Bld  Date Value Ref Range Status  05/06/2023 164 (H) 65 - 99 mg/dL Final    Comment:    .            Fasting reference interval . For someone without known diabetes, a glucose value >125 mg/dL indicates that they may have diabetes and this should be confirmed with a follow-up test. .   04/28/2023 115 (H) 65 - 99 mg/dL Final    Comment:    .            Fasting reference interval . For someone without known diabetes, a glucose value between 100 and 125 mg/dL is consistent with prediabetes and should be confirmed with a follow-up test. .   11/21/2022 154 (H) 65 - 99 mg/dL Final    Comment:    .            Fasting reference interval . For someone without known diabetes, a glucose value >125 mg/dL indicates that they may have diabetes and this should be confirmed with a follow-up test. .     Flowsheet Row Office Visit from 11/21/2022 [facility]  AUDIT-C Score 1      Divorced STD testing and prevention (HIV/chl/gon/syphilis): 10/05/19 Sexual history:  Hep C Screening: 05/27/17 Skin cancer: Discussed  monitoring for atypical lesions Colorectal cancer: 08/01/22 Prostate cancer:   Lab Results  Component Value Date   PSA 0.5 01/23/2017     Lung cancer:  Low Dose CT Chest recommended if Age 34-80 years, 30 pack-year currently smoking OR have quit w/in 15years. Patient is not a candidate for screening   AAA: The USPSTF recommends one-time screening with ultrasonography in men ages 19 to 75 years who have ever smoked. Patient  is not  a candidate for screening  ECG:  04/28/23  Vaccines:   HPV: N/A Tdap: up to date Shingrix: discussed with patient  Pneumonia: N/A Flu: due but he  refused  COVID-19: N/A  Advanced Care Planning: A voluntary discussion about advance care planning including the explanation and discussion of advance directives.  Discussed health care proxy and Living will, and the patient was able to identify a health care proxy as oldest son.  Patient does not have a living will and power of attorney of health care   Patient Active Problem List   Diagnosis Date Noted   Diabetes mellitus with microalbuminuria (HCC) 02/24/2023   Osteoarthritis, generalized 07/26/2021   HTN (hypertension), benign 07/26/2021   Chronic neck pain 11/17/2018   History of lumbar discectomy 03/04/2018   Hypotestosteronism 11/26/2017   History of subarachnoid hemorrhage 09/17/2017   Vitamin D insufficiency 08/26/2017   GERD (gastroesophageal reflux disease) 10/25/2016   Morbid obesity (HCC) 10/25/2016   ED (erectile dysfunction) of organic origin 02/20/2016   Anxiety 01/01/2016   Elevated liver enzymes 07/27/2015   Intermittent low back pain 07/27/2015   Fatty liver 05/03/2015   Dyslipidemia associated with type 2 diabetes mellitus (HCC) 04/24/2015   HLD (hyperlipidemia) 04/24/2015   Fever blister 04/24/2015    Past Surgical History:  Procedure Laterality Date   BACK SURGERY  8 years ago    Family History  Problem Relation Age of Onset   Diabetes Mother    Cancer Father 70        lung cancer   Lung cancer Father    Liver disease Brother  Alcohol abuse Brother    ADD / ADHD Son    ADD / ADHD Son     Social History   Socioeconomic History   Marital status: Divorced    Spouse name: Not on file   Number of children: 2   Years of education: Not on file   Highest education level: Associate degree: occupational, Scientist, product/process development, or vocational program  Occupational History   Occupation: Owner    Comment: co-owner   Tobacco Use   Smoking status: Former    Current packs/day: 0.00    Types: Cigarettes    Quit date: 02/15/1990    Years since quitting: 33.4   Smokeless tobacco: Never  Vaping Use   Vaping status: Former  Substance and Sexual Activity   Alcohol use: Yes    Alcohol/week: 0.0 standard drinks of alcohol    Comment: Occasional    Drug use: No   Sexual activity: Yes    Partners: Female    Birth control/protection: Surgical  Other Topics Concern   Not on file  Social History Narrative   Divorced, ex wife used to cheat on him and also was an alcoholic    He has joint custody of his younger son. Her drinking is under better control   He has a grown son   Social Determinants of Corporate investment banker Strain: Low Risk  (07/23/2022)   Overall Financial Resource Strain (CARDIA)    Difficulty of Paying Living Expenses: Not hard at all  Food Insecurity: No Food Insecurity (07/23/2022)   Hunger Vital Sign    Worried About Running Out of Food in the Last Year: Never true    Ran Out of Food in the Last Year: Never true  Transportation Needs: No Transportation Needs (07/23/2022)   PRAPARE - Administrator, Civil Service (Medical): No    Lack of Transportation (Non-Medical): No  Physical Activity: Sufficiently Active (07/23/2022)   Exercise Vital Sign    Days of Exercise per Week: 4 days    Minutes of Exercise per Session: 60 min  Stress: No Stress Concern Present (07/23/2022)   [employer] of Occupational Health - Occupational  Stress Questionnaire    Feeling of Stress : Not at all  Social Connections: Moderately Integrated (07/23/2022)   Social Connection and Isolation Panel [NHANES]    Frequency of Communication with Friends and Family: More than three times a week    Frequency of Social Gatherings with Friends and Family: More than three times a week    Attends Religious Services: More than 4 times per year    Active Member of [employer] or Organizations: Yes    Attends Engineer, structural: More than 4 times per year    Marital Status: Never married  Intimate Partner Violence: Not At Risk (07/23/2022)   Humiliation, Afraid, Rape, and Kick questionnaire    Fear of Current or Ex-Partner: No    Emotionally Abused: No    Physically Abused: No    Sexually Abused: No     Current Outpatient Medications:    ALPRAZolam (XANAX) 0.5 MG tablet, Take 1 tablet (0.5 mg total) by mouth daily as needed., Disp: 30 tablet, Rfl: 0   cetirizine (ZYRTEC) 10 MG tablet, Take 1 tablet (10 mg total) by mouth daily., Disp: 60 tablet, Rfl: 0   Dulaglutide (TRULICITY) 3 MG/0.5ML SOPN, Inject 3 mg as directed once a week., Disp: 6 mL, Rfl: 1   EPINEPHrine 0.3 mg/0.3 mL IJ SOAJ injection, Inject 0.3  mg into the muscle as needed for anaphylaxis., Disp: 2 each, Rfl: 0   esomeprazole (NEXIUM) 40 MG capsule, Take 1 capsule (40 mg total) by mouth in the morning., Disp: 90 capsule, Rfl: 1   Fluocinonide 0.1 % CREA, Apply 1 g topically 2 (two) times daily., Disp: 120 g, Rfl: 0   hydrOXYzine (ATARAX) 10 MG tablet, Take 1 tablet (10 mg total) by mouth at bedtime as needed., Disp: 30 tablet, Rfl: 0   lovastatin (MEVACOR) 40 MG tablet, Take 1 tablet (40 mg total) by mouth at bedtime., Disp: 90 tablet, Rfl: 0   meloxicam (MOBIC) 15 MG tablet, Take 1 tablet (15 mg total) by mouth daily as needed for pain. Take tylenol instead when possible, Disp: 90 tablet, Rfl: 0   metFORMIN (GLUCOPHAGE-XR) 750 MG 24 hr tablet, Take 2 tablets (1,500 mg total) by  mouth daily with breakfast., Disp: 180 tablet, Rfl: 1   Multiple Vitamin (MULTI VITAMIN DAILY PO), Take by mouth daily. , Disp: , Rfl:    pioglitazone (ACTOS) 15 MG tablet, Take 1 tablet (15 mg total) by mouth daily., Disp: 90 tablet, Rfl: 1   valACYclovir (VALTREX) 1000 MG tablet, Take 1 tablet (1,000 mg total) by mouth 2 (two) times daily., Disp: 20 tablet, Rfl: 0  No Known Allergies   ROS  Constitutional: Negative for fever or weight change.  Respiratory: Negative for cough and shortness of breath.   Cardiovascular: Negative for chest pain or palpitations.  Gastrointestinal: Negative for abdominal pain, no bowel changes.  Musculoskeletal: Negative for gait problem or joint swelling.  Skin: Negative for rash.  Neurological: Negative for dizziness or headache.  No other specific complaints in a complete review of systems (except as listed in HPI above).    Objective  Vitals:   07/28/23 0846  BP: 122/72  Pulse: 79  Resp: 16  Temp: 97.7 F (36.5 C)  TempSrc: Oral  SpO2: 96%  Weight: 255 lb 6.4 oz (115.8 kg)  Height: 5\' 8"  (1.727 m)    Body mass index is 38.83 kg/m.  Physical Exam  Constitutional: Patient appears well-developed and well-nourished. Obese. No distress.  HENT: Head: Normocephalic and atraumatic. Ears: B TMs ok, no erythema or effusion; Nose: Nose normal. Mouth/Throat: Oropharynx is clear and moist. No oropharyngeal exudate.  Eyes: Conjunctivae and EOM are normal. Pupils are equal, round, and reactive to light. No scleral icterus.  Neck: Normal range of motion. Neck supple. No JVD present. No thyromegaly present.  Cardiovascular: Normal rate, regular rhythm and normal heart sounds.  No murmur heard. No BLE edema. Pulmonary/Chest: Effort normal and breath sounds normal. No respiratory distress. Abdominal: Soft. Bowel sounds are normal, no distension. There is no tenderness. no masses MALE GENITALIA: Normal descended testes bilaterally, no masses palpated, no  hernias, no lesions, no discharge RECTAL: not done  Musculoskeletal: Normal range of motion, no joint effusions. No gross deformities Neurological: he is alert and oriented to person, place, and time. No cranial nerve deficit. Coordination, balance, strength, speech and gait are normal.  Skin: Skin is warm and dry. No rash noted. No erythema.  Psychiatric: Patient has a normal mood and affect. behavior is normal. Judgment and thought content normal.   Recent Results (from the past 2160 hour(s))  Fe+TIBC+Fer     Status: None   Collection Time: 05/06/23  9:12 AM  Result Value Ref Range   Iron 89 50 - 180 mcg/dL   TIBC 161 096 - 045 mcg/dL (calc)   %SAT 22 20 -  48 % (calc)   Ferritin 48 38 - 380 ng/mL  BASIC METABOLIC PANEL WITH GFR     Status: Abnormal   Collection Time: 05/06/23  9:12 AM  Result Value Ref Range   Glucose, Bld 164 (H) 65 - 99 mg/dL    Comment: .            Fasting reference interval . For someone without known diabetes, a glucose value >125 mg/dL indicates that they may have diabetes and this should be confirmed with a follow-up test. .    BUN 25 7 - 25 mg/dL   Creat 1.19 1.47 - 8.29 mg/dL   eGFR 79 > OR = 60 FA/OZH/0.86V7   BUN/Creatinine Ratio SEE NOTE: 6 - 22 (calc)    Comment:    Not Reported: BUN and Creatinine are within    reference range. .    Sodium 140 135 - 146 mmol/L   Potassium 4.9 3.5 - 5.3 mmol/L   Chloride 105 98 - 110 mmol/L   CO2 25 20 - 32 mmol/L   Calcium 10.1 8.6 - 10.3 mg/dL  POCT HgB Q4O     Status: Abnormal   Collection Time: 06/04/23  8:47 AM  Result Value Ref Range   Hemoglobin A1C 7.3 (A) 4.0 - 5.6 %   HbA1c POC (<> result, manual entry)     HbA1c, POC (prediabetic range)     HbA1c, POC (controlled diabetic range)       Fall Risk:    06/04/2023    8:48 AM 04/28/2023   11:27 AM 02/24/2023    8:28 AM 11/21/2022    8:59 AM 08/20/2022    9:26 AM  Fall Risk   Falls in the past year? 0 0 0 0 0  Number falls in past yr:  0 0  0   Injury with Fall?  0 0  0  Risk for fall due to : No Fall Risks No Fall Risks No Fall Risks No Fall Risks No Fall Risks  Follow up Falls prevention discussed Falls prevention discussed Falls prevention discussed Falls prevention discussed Falls prevention discussed    Assessment & Plan  1. Well adult exam  Reviewed recent labs Advised on vaccines - he may go to local pharmacy for that     -Prostate cancer screening and PSA options (with potential risks and benefits of testing vs not testing) were discussed along with recent recs/guidelines. -USPSTF grade A and B recommendations reviewed with patient; age-appropriate recommendations, preventive care, screening tests, etc discussed and encouraged; healthy living encouraged; see AVS for patient education given to patient -Discussed importance of 150 minutes of physical activity weekly, eat two servings of fish weekly, eat one serving of tree nuts ( cashews, pistachios, pecans, almonds.Marland Kitchen) every other day, eat 6 servings of fruit/vegetables daily and drink plenty of water and avoid sweet beverages.  -Reviewed Health Maintenance: yes

## 2023-07-28 ENCOUNTER — Encounter: Payer: Self-pay | Admitting: Family Medicine

## 2023-07-28 ENCOUNTER — Ambulatory Visit (INDEPENDENT_AMBULATORY_CARE_PROVIDER_SITE_OTHER): Payer: 59 | Admitting: Family Medicine

## 2023-07-28 VITALS — BP 122/72 | HR 79 | Temp 97.7°F | Resp 16 | Ht 68.0 in | Wt 255.4 lb

## 2023-07-28 DIAGNOSIS — Z0000 Encounter for general adult medical examination without abnormal findings: Secondary | ICD-10-CM

## 2023-07-31 ENCOUNTER — Other Ambulatory Visit: Payer: Self-pay

## 2023-07-31 MED FILL — Meloxicam Tab 15 MG: ORAL | 30 days supply | Qty: 30 | Fill #2 | Status: AC

## 2023-08-28 ENCOUNTER — Other Ambulatory Visit: Payer: Self-pay | Admitting: Family Medicine

## 2023-08-28 DIAGNOSIS — M7541 Impingement syndrome of right shoulder: Secondary | ICD-10-CM

## 2023-08-28 DIAGNOSIS — G8929 Other chronic pain: Secondary | ICD-10-CM

## 2023-08-28 DIAGNOSIS — R21 Rash and other nonspecific skin eruption: Secondary | ICD-10-CM

## 2023-08-28 MED ORDER — MELOXICAM 15 MG PO TABS
15.0000 mg | ORAL_TABLET | Freq: Every day | ORAL | 0 refills | Status: DC | PRN
  Filled 2023-08-28: qty 30, 30d supply, fill #0
  Filled 2023-11-13: qty 30, 30d supply, fill #1

## 2023-08-28 MED ORDER — CETIRIZINE HCL 10 MG PO TABS
10.0000 mg | ORAL_TABLET | Freq: Every day | ORAL | 0 refills | Status: DC
Start: 2023-08-28 — End: 2023-11-13
  Filled 2023-08-28: qty 30, 30d supply, fill #0
  Filled 2023-09-13: qty 90, 90d supply, fill #0

## 2023-08-29 ENCOUNTER — Other Ambulatory Visit: Payer: Self-pay

## 2023-09-03 NOTE — Progress Notes (Unsigned)
Name: Trevor Dennis.   MRN: [id number]    DOB: [birth year]   Date:09/04/2023       Progress Note  Subjective  Chief Complaint  Follow Up  HPI  Diabetes Type 2 : A1C  January 2020 was 8%, A1C has gone as high at 9.7%. His A1C went up from 6.8 % to 7.1 , 7 % and today is up to 7.3% , currently on Trulicity 3 mg ,  1500 mg of Metformin and Actos 15 mg. He denies polyphagia, polydipsia or polyuria. He has obesity, fatty liver, dyslipidemia, microalbuminuria.  ED  associated with DM. He was taking lisinopril  for microalbuminuria however bp dropped and he is off medication now. He quit smoking . A1C is up to 8 %, nausea resolved and we will try to increase dose to 4.5 mg today, he states has a new insurance in January and would like go back on Ozempic, discussed Mounjaro also due to increase in weight loss. He has bought a house and is int he process of moving, his diet has not been good lately    Chronic neck/Back pain/OA knee and elbow: he went to [facility] but out of network, he wen to [place], he states he was told he has OA. He states he is aching all the time. He is only taking Meloxicam prn now , sometimes Tylenol . Symptoms are stable at this time    Fatty liver; he had [place] in 2018, his last liver enzymes normalized but we will continue to monitor   Hyperlipidemia:last LDL was 61 , continue current medications . Continue Mevacor    Morbid Obesity: BMI over 35 with co-morbidities such as DM, HTN and dyslipidemia, weight is trending down. Weight is down, we will continue to monitor   GERD:   He states regurgitation improved with mouthguard for GERD but still needs to take Nexium daily   OSA: he finally had sleep study done in May, it was positive, orders signed but he never got the supplies. 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 Currently dating , ED medication causes him to flush and does not like it Discussed referral to Urologist but he is not interested at this time.  Unchanged   Patient Active Problem List   Diagnosis Date Noted   Diabetes mellitus with microalbuminuria (HCC) 02/24/2023   Osteoarthritis, generalized 07/26/2021   HTN (hypertension), benign 07/26/2021   Chronic neck pain 11/17/2018   History of lumbar discectomy 03/04/2018   Hypotestosteronism 11/26/2017   History of subarachnoid hemorrhage 09/17/2017   Vitamin D insufficiency 08/26/2017   GERD (gastroesophageal reflux disease) 10/25/2016   Morbid obesity (HCC) 10/25/2016   ED (erectile dysfunction) of organic origin 02/20/2016   Anxiety 01/01/2016   Elevated liver enzymes 07/27/2015   Intermittent low back pain 07/27/2015   Fatty liver 05/03/2015   Dyslipidemia associated with type 2 diabetes mellitus (HCC) 04/24/2015   HLD (hyperlipidemia) 04/24/2015   Fever blister 04/24/2015    Past Surgical History:  Procedure Laterality Date   BACK SURGERY  8 years ago    Family History  Problem Relation Age of Onset   Diabetes Mother    Cancer Father 64       lung cancer   Lung cancer Father  Liver disease Brother    Alcohol abuse Brother    ADD / ADHD Son    ADD / ADHD Son     Social History   Tobacco Use   Smoking status: Former    Current packs/day: 0.00    Types: Cigarettes    Quit date: 02/15/1990    Years since quitting: 33.5   Smokeless tobacco: Never  Substance Use Topics   Alcohol use: Yes    Alcohol/week: 0.0 standard drinks of alcohol    Comment: Occasional      Current Outpatient Medications:    ALPRAZolam (XANAX) 0.5 MG tablet, Take 1 tablet (0.5 mg total) by mouth daily as needed., Disp: 30 tablet, Rfl: 0   cetirizine (ZYRTEC) 10 MG tablet, Take 1 tablet (10 mg total) by mouth daily., Disp: 90 tablet, Rfl: 0   Dulaglutide (TRULICITY) 4.5 MG/0.5ML SOAJ, Inject 4.5 mg into the skin  once a week., Disp: 2 mL, Rfl: 2   EPINEPHrine 0.3 mg/0.3 mL IJ SOAJ injection, Inject 0.3 mg into the muscle as needed for anaphylaxis., Disp: 2 each, Rfl: 0   Fluocinonide 0.1 % CREA, Apply 1 g topically 2 (two) times daily., Disp: 120 g, Rfl: 0   hydrOXYzine (ATARAX) 10 MG tablet, Take 1 tablet (10 mg total) by mouth at bedtime as needed., Disp: 30 tablet, Rfl: 0   meloxicam (MOBIC) 15 MG tablet, Take 1 tablet (15 mg total) by mouth daily as needed for pain. Take tylenol instead when possible, Disp: 90 tablet, Rfl: 0   metFORMIN (GLUCOPHAGE-XR) 750 MG 24 hr tablet, Take 2 tablets (1,500 mg total) by mouth daily with breakfast., Disp: 180 tablet, Rfl: 1   Multiple Vitamin (MULTI VITAMIN DAILY PO), Take by mouth daily. , Disp: , Rfl:    pioglitazone (ACTOS) 15 MG tablet, Take 1 tablet (15 mg total) by mouth daily., Disp: 90 tablet, Rfl: 1   valACYclovir (VALTREX) 1000 MG tablet, Take 1 tablet (1,000 mg total) by mouth 2 (two) times daily., Disp: 20 tablet, Rfl: 0   esomeprazole (NEXIUM) 40 MG capsule, Take 1 capsule (40 mg total) by mouth in the morning., Disp: 90 capsule, Rfl: 1   lovastatin (MEVACOR) 40 MG tablet, Take 1 tablet (40 mg total) by mouth at bedtime., Disp: 90 tablet, Rfl: 0  No Known Allergies  I personally reviewed active problem list, medication list, allergies, family history, social history, health maintenance with the patient/caregiver today.   ROS  Constitutional: Negative for fever or weight change.  Respiratory: Negative for cough and shortness of breath.   Cardiovascular: Negative for chest pain or palpitations.  Gastrointestinal: Negative for abdominal pain, no bowel changes.  Musculoskeletal: Negative for gait problem or joint swelling.  Skin: Negative for rash.  Neurological: Negative for dizziness or headache.  No other specific complaints in a complete review of systems (except as listed in HPI above).   Objective  Vitals:   09/04/23 0833  BP: 116/70   Pulse: 79  Resp: 16  Temp: 97.8 F (36.6 C)  TempSrc: Oral  SpO2: 98%  Weight: 249 lb 9.6 oz (113.2 kg)  Height: 5\' 8"  (1.727 m)    Body mass index is 37.95 kg/m.  Physical Exam  Constitutional: Patient appears well-developed and well-nourished. Obese  No distress.  HEENT: head atraumatic, normocephalic, pupils equal and reactive to light, neck supple Cardiovascular: Normal rate, regular rhythm and normal heart sounds.  No murmur heard. No BLE edema. Pulmonary/Chest: Effort normal and breath sounds normal. No respiratory  distress. Abdominal: Soft.  There is no tenderness. Psychiatric: Patient has a normal mood and affect. behavior is normal. Judgment and thought content normal.   Recent Results (from the past 2160 hour(s))  POCT HgB A1C     Status: Abnormal   Collection Time: 09/04/23  8:42 AM  Result Value Ref Range   Hemoglobin A1C 8.0 (A) 4.0 - 5.6 %   HbA1c POC (<> result, manual entry)     HbA1c, POC (prediabetic range)     HbA1c, POC (controlled diabetic range)       PHQ2/9:    09/04/2023    8:32 AM 07/28/2023    8:47 AM 06/04/2023    8:48 AM 04/28/2023   11:27 AM 02/24/2023    8:28 AM  Depression screen PHQ 2/9  Decreased Interest 0 0 0 0 0  Down, Depressed, Hopeless 0 0 0 0 0  PHQ - 2 Score 0 0 0 0 0  Altered sleeping 0 0 0  0  Tired, decreased energy 0 0 0  0  Change in appetite 0 0 0  0  Feeling bad or failure about yourself  0 0 0  0  Trouble concentrating 0 0 0  0  Moving slowly or fidgety/restless 0 0 0  0  Suicidal thoughts 0 0 0  0  PHQ-9 Score 0 0 0  0    phq 9 is negative   Fall Risk:    09/04/2023    8:32 AM 07/28/2023    8:47 AM 06/04/2023    8:48 AM 04/28/2023   11:27 AM 02/24/2023    8:28 AM  Fall Risk   Falls in the past year? 0 0 0 0 0  Number falls in past yr:    0 0  Injury with Fall?    0 0  Risk for fall due to : No Fall Risks No Fall Risks No Fall Risks No Fall Risks No Fall Risks  Follow up Falls prevention discussed Falls  prevention discussed;Education provided;Falls evaluation completed Falls prevention discussed Falls prevention discussed Falls prevention discussed      Assessment & Plan  1. Dyslipidemia associated with type 2 diabetes mellitus (HCC)  - POCT HgB A1C - Dulaglutide (TRULICITY) 4.5 MG/0.5ML SOAJ; Inject 4.5 mg into the skin once a week.  Dispense: 2 mL; Refill: 2 - lovastatin (MEVACOR) 40 MG tablet; Take 1 tablet (40 mg total) by mouth at bedtime.  Dispense: 90 tablet; Refill: 0  2. Morbid obesity (HCC)  Discussed with the patient the risk posed by an increased BMI. Discussed importance of portion control, calorie counting and at least 150 minutes of physical activity weekly. Avoid sweet beverages and drink more water. Eat at least 6 servings of fruit and vegetables daily    3. Pure hypercholesterolemia  - lovastatin (MEVACOR) 40 MG tablet; Take 1 tablet (40 mg total) by mouth at bedtime.  Dispense: 90 tablet; Refill: 0  4. Gastroesophageal reflux disease without esophagitis  - esomeprazole (NEXIUM) 40 MG capsule; Take 1 capsule (40 mg total) by mouth in the morning.  Dispense: 90 capsule; Refill: 1  5. Obstructive sleep apnea   6. Osteoarthritis, generalized

## 2023-09-04 ENCOUNTER — Ambulatory Visit: Payer: 59 | Admitting: Family Medicine

## 2023-09-04 ENCOUNTER — Other Ambulatory Visit: Payer: Self-pay

## 2023-09-04 ENCOUNTER — Encounter: Payer: Self-pay | Admitting: Family Medicine

## 2023-09-04 VITALS — BP 116/70 | HR 79 | Temp 97.8°F | Resp 16 | Ht 68.0 in | Wt 249.6 lb

## 2023-09-04 DIAGNOSIS — E785 Hyperlipidemia, unspecified: Secondary | ICD-10-CM | POA: Diagnosis not present

## 2023-09-04 DIAGNOSIS — G4733 Obstructive sleep apnea (adult) (pediatric): Secondary | ICD-10-CM | POA: Diagnosis not present

## 2023-09-04 DIAGNOSIS — K219 Gastro-esophageal reflux disease without esophagitis: Secondary | ICD-10-CM | POA: Diagnosis not present

## 2023-09-04 DIAGNOSIS — M159 Polyosteoarthritis, unspecified: Secondary | ICD-10-CM

## 2023-09-04 DIAGNOSIS — E7800 Pure hypercholesterolemia, unspecified: Secondary | ICD-10-CM | POA: Diagnosis not present

## 2023-09-04 DIAGNOSIS — E1169 Type 2 diabetes mellitus with other specified complication: Secondary | ICD-10-CM | POA: Diagnosis not present

## 2023-09-04 LAB — POCT GLYCOSYLATED HEMOGLOBIN (HGB A1C): Hemoglobin A1C: 8 % — AB (ref 4.0–5.6)

## 2023-09-04 MED ORDER — ESOMEPRAZOLE MAGNESIUM 40 MG PO CPDR
40.0000 mg | DELAYED_RELEASE_CAPSULE | Freq: Every morning | ORAL | 1 refills | Status: DC
  Filled 2023-09-04: qty 90, 90d supply, fill #0
  Filled 2023-10-05: qty 30, 30d supply, fill #0
  Filled 2023-11-13 – 2023-12-05 (×3): qty 30, 30d supply, fill #1

## 2023-09-04 MED ORDER — TRULICITY 4.5 MG/0.5ML ~~LOC~~ SOAJ
4.5000 mg | SUBCUTANEOUS | 2 refills | Status: DC
Start: 2023-09-04 — End: 2023-12-09
  Filled 2023-09-04 – 2023-09-13 (×2): qty 2, 28d supply, fill #0
  Filled 2023-10-16 – 2023-10-22 (×3): qty 2, 28d supply, fill #1
  Filled 2023-11-13: qty 2, 28d supply, fill #2

## 2023-09-04 MED ORDER — LOVASTATIN 40 MG PO TABS
40.0000 mg | ORAL_TABLET | Freq: Every day | ORAL | 0 refills | Status: DC
  Filled 2023-09-04: qty 90, 90d supply, fill #0
  Filled 2023-09-28: qty 30, 30d supply, fill #0
  Filled 2023-10-22: qty 30, 30d supply, fill #1
  Filled 2023-11-13: qty 30, 30d supply, fill #2

## 2023-09-13 ENCOUNTER — Other Ambulatory Visit: Payer: Self-pay

## 2023-09-14 ENCOUNTER — Other Ambulatory Visit: Payer: Self-pay

## 2023-09-28 ENCOUNTER — Other Ambulatory Visit: Payer: Self-pay

## 2023-10-05 ENCOUNTER — Other Ambulatory Visit: Payer: Self-pay

## 2023-10-06 ENCOUNTER — Other Ambulatory Visit: Payer: Self-pay

## 2023-10-16 ENCOUNTER — Other Ambulatory Visit: Payer: Self-pay

## 2023-10-17 ENCOUNTER — Other Ambulatory Visit: Payer: Self-pay

## 2023-10-20 ENCOUNTER — Telehealth: Payer: Self-pay

## 2023-10-20 NOTE — Telephone Encounter (Signed)
 PA done through Cover my meds waiting on insurance to determine

## 2023-10-22 ENCOUNTER — Other Ambulatory Visit: Payer: Self-pay

## 2023-11-13 ENCOUNTER — Other Ambulatory Visit: Payer: Self-pay

## 2023-11-13 ENCOUNTER — Other Ambulatory Visit: Payer: Self-pay | Admitting: Family Medicine

## 2023-11-13 DIAGNOSIS — R21 Rash and other nonspecific skin eruption: Secondary | ICD-10-CM

## 2023-11-14 ENCOUNTER — Other Ambulatory Visit: Payer: Self-pay

## 2023-11-14 ENCOUNTER — Telehealth: Payer: Self-pay

## 2023-11-14 MED ORDER — CETIRIZINE HCL 10 MG PO TABS
10.0000 mg | ORAL_TABLET | Freq: Every day | ORAL | 0 refills | Status: DC
Start: 2023-11-14 — End: 2024-09-04
  Filled 2023-11-14: qty 90, 90d supply, fill #0
  Filled 2023-12-15: qty 30, 30d supply, fill #0
  Filled 2024-05-19 – 2024-06-02 (×2): qty 30, 30d supply, fill #1
  Filled 2024-08-08: qty 30, 30d supply, fill #2

## 2023-11-14 NOTE — Telephone Encounter (Signed)
Called pt, no answer left detailed vm Ask if he can please upload new insurance card for 2025, since we received a prior authorization request to be done for is esomeprazole prescription. We can not proceed until new insurance card is in file.

## 2023-11-16 ENCOUNTER — Other Ambulatory Visit: Payer: Self-pay

## 2023-11-17 ENCOUNTER — Other Ambulatory Visit: Payer: Self-pay

## 2023-11-20 ENCOUNTER — Encounter: Payer: Self-pay | Admitting: Family Medicine

## 2023-11-20 ENCOUNTER — Ambulatory Visit: Payer: Self-pay | Admitting: *Deleted

## 2023-11-20 ENCOUNTER — Ambulatory Visit: Payer: 59 | Admitting: Family Medicine

## 2023-11-20 ENCOUNTER — Other Ambulatory Visit: Payer: Self-pay

## 2023-11-20 VITALS — BP 122/80 | HR 77 | Temp 98.2°F | Resp 16 | Ht 68.0 in | Wt 250.7 lb

## 2023-11-20 DIAGNOSIS — Z23 Encounter for immunization: Secondary | ICD-10-CM

## 2023-11-20 DIAGNOSIS — S61230A Puncture wound without foreign body of right index finger without damage to nail, initial encounter: Secondary | ICD-10-CM

## 2023-11-20 MED ORDER — AMOXICILLIN-POT CLAVULANATE 875-125 MG PO TABS
1.0000 | ORAL_TABLET | Freq: Two times a day (BID) | ORAL | 0 refills | Status: DC
  Filled 2023-11-20: qty 14, 7d supply, fill #0

## 2023-11-20 NOTE — Telephone Encounter (Signed)
 Summary: Pt wants a tetanus shot, punctured by rust, swollen and red   Pt was punctured by a rusty sharp fence, wants to come for a tetanus shot. Area is swollen and red.  Best contact: [phone]     Reason for Disposition  Puncture wound from a sharp object that was very dirty  Answer Assessment - Initial Assessment Questions 1. LOCATION: Where is the puncture located?      R index 2. OBJECT: What was the object that punctured the skin?      Rusty wire 3. DEPTH: How deep do you think the puncture goes?      Deep puncture to the bone 4. ONSET: When did the injury occur? (Minutes or hours)     Yesterday pm 5. PAIN: Is it painful? If Yes, ask: How bad is the pain?  (Scale 1-10; or mild, moderate, severe)     Yes- sore 6. TETANUS: When was the last tetanus booster?     2019  Protocols used: Puncture Wound-A-AH

## 2023-11-20 NOTE — Progress Notes (Signed)
 Name: Trevor Dennis.   MRN: [id number]    DOB: 1971-05-23   Date:11/20/2023       Progress Note  Subjective  Chief Complaint  Chief Complaint  Patient presents with   Wound Check    R hand on index knuckle happened yesterday while working with rusted wire for fence   Discussed the use of AI scribe software for clinical note transcription with the patient, who gave verbal consent to proceed.  History of Present Illness   Trevor Slimp. is a 53 year old male with diabetes who presents with a puncture wound to the right index finger knuckle.  He sustained a puncture wound to the right index finger knuckle yesterday while removing a metal fence. The metal piece penetrated through a glove and entered the joint, hitting the bone. The injury did not bleed much, similar to a finger prick for a sugar check, and he cleaned the wound thoroughly.  He experiences significant pain and tightness in the affected area, making it difficult to form a fist. He took meloxicam , an anti-inflammatory, this morning to manage the pain. He is concerned about potential infection, especially given the rust on the metal and the location of the wound in the joint.  His last tetanus shot was in 2019. He recently moved to a new house and was cleaning up the yard, which led to the injury.   No significant bleeding from the wound. Difficulty making a fist due to tightness and soreness in the right index finger knuckle.       Patient Active Problem List   Diagnosis Date Noted   Diabetes mellitus with microalbuminuria (HCC) 02/24/2023   Osteoarthritis, generalized 07/26/2021   HTN (hypertension), benign 07/26/2021   Chronic neck pain 11/17/2018   History of lumbar discectomy 03/04/2018   Hypotestosteronism 11/26/2017   History of subarachnoid hemorrhage 09/17/2017   Vitamin D  insufficiency 08/26/2017   GERD (gastroesophageal reflux disease) 10/25/2016   Morbid obesity (HCC) 10/25/2016   ED (erectile  dysfunction) of organic origin 02/20/2016   Anxiety 01/01/2016   Elevated liver enzymes 07/27/2015   Intermittent low back pain 07/27/2015   Fatty liver 05/03/2015   Dyslipidemia associated with type 2 diabetes mellitus (HCC) 04/24/2015   HLD (hyperlipidemia) 04/24/2015   Fever blister 04/24/2015    Past Surgical History:  Procedure Laterality Date   BACK SURGERY  8 years ago    Family History  Problem Relation Age of Onset   Diabetes Mother    Cancer Father 33       lung cancer   Lung cancer Father    Liver disease Brother    Alcohol abuse Brother    ADD / ADHD Son    ADD / ADHD Son     Social History   Tobacco Use   Smoking status: Former    Current packs/day: 0.00    Types: Cigarettes    Quit date: 02/15/1990    Years since quitting: 33.7   Smokeless tobacco: Never  Substance Use Topics   Alcohol use: Yes    Alcohol/week: 0.0 standard drinks of alcohol    Comment: Occasional      Current Outpatient Medications:    ALPRAZolam  (XANAX ) 0.5 MG tablet, Take 1 tablet (0.5 mg total) by mouth daily as needed., Disp: 30 tablet, Rfl: 0   amoxicillin -clavulanate (AUGMENTIN ) 875-125 MG tablet, Take 1 tablet by mouth 2 (two) times daily., Disp: 14 tablet, Rfl: 0   cetirizine  (ZYRTEC ) 10 MG tablet, Take  1 tablet (10 mg total) by mouth daily., Disp: 90 tablet, Rfl: 0   Dulaglutide  (TRULICITY ) 4.5 MG/0.5ML SOAJ, Inject 4.5 mg into the skin once a week., Disp: 2 mL, Rfl: 2   EPINEPHrine  0.3 mg/0.3 mL IJ SOAJ injection, Inject 0.3 mg into the muscle as needed for anaphylaxis., Disp: 2 each, Rfl: 0   esomeprazole  (NEXIUM ) 40 MG capsule, Take 1 capsule (40 mg total) by mouth in the morning., Disp: 90 capsule, Rfl: 1   Fluocinonide  0.1 % CREA, Apply 1 g topically 2 (two) times daily., Disp: 120 g, Rfl: 0   hydrOXYzine  (ATARAX ) 10 MG tablet, Take 1 tablet (10 mg total) by mouth at bedtime as needed., Disp: 30 tablet, Rfl: 0   lovastatin  (MEVACOR ) 40 MG tablet, Take 1 tablet (40 mg  total) by mouth at bedtime., Disp: 90 tablet, Rfl: 0   meloxicam  (MOBIC ) 15 MG tablet, Take 1 tablet (15 mg total) by mouth daily as needed for pain. Take tylenol instead when possible, Disp: 90 tablet, Rfl: 0   metFORMIN  (GLUCOPHAGE -XR) 750 MG 24 hr tablet, Take 2 tablets (1,500 mg total) by mouth daily with breakfast., Disp: 180 tablet, Rfl: 1   Multiple Vitamin (MULTI VITAMIN DAILY PO), Take by mouth daily. , Disp: , Rfl:    pioglitazone  (ACTOS ) 15 MG tablet, Take 1 tablet (15 mg total) by mouth daily., Disp: 90 tablet, Rfl: 1   valACYclovir  (VALTREX ) 1000 MG tablet, Take 1 tablet (1,000 mg total) by mouth 2 (two) times daily., Disp: 20 tablet, Rfl: 0  No Known Allergies  I personally reviewed active problem list, medication list, allergies, family history with the patient/caregiver today.   ROS  Ten systems reviewed and is negative except as mentioned in HPI    Objective  Vitals:   11/20/23 1027  BP: 122/80  Pulse: 77  Resp: 16  Temp: 98.2 F (36.8 C)  TempSrc: Oral  SpO2: 98%  Weight: 250 lb 11.2 oz (113.7 kg)  Height: 5' 8 (1.727 m)    Body mass index is 38.12 kg/m.  Physical Exam  Constitutional: Patient appears well-developed and well-nourished. Obese  No distress.  HEENT: head atraumatic, normocephalic, pupils equal and reactive to light,, neck supple Cardiovascular: Normal rate, regular rhythm and normal heart sounds.  No murmur heard. No BLE edema. Pulmonary/Chest: Effort normal and breath sounds normal. No respiratory distress. Skin:punctured wound on right index finger, near the MCP joint, mild erythema extending proximally from the wound  Abdominal: Soft.  There is no tenderness. Psychiatric: Patient has a normal mood and affect. behavior is normal. Judgment and thought content normal.   Recent Results (from the past 2160 hours)  POCT HgB A1C     Status: Abnormal   Collection Time: 09/04/23  8:42 AM  Result Value Ref Range   Hemoglobin A1C 8.0 (A) 4.0 -  5.6 %   HbA1c POC (<> result, manual entry)     HbA1c, POC (prediabetic range)     HbA1c, POC (controlled diabetic range)      Diabetic Foot Exam:     PHQ2/9:    11/20/2023   10:27 AM 09/04/2023    8:32 AM 07/28/2023    8:47 AM 06/04/2023    8:48 AM 04/28/2023   11:27 AM  Depression screen PHQ 2/9  Decreased Interest 0 0 0 0 0  Down, Depressed, Hopeless 0 0 0 0 0  PHQ - 2 Score 0 0 0 0 0  Altered sleeping 0 0 0 0  Tired, decreased energy 0 0 0 0   Change in appetite 0 0 0 0   Feeling bad or failure about yourself  0 0 0 0   Trouble concentrating 0 0 0 0   Moving slowly or fidgety/restless 0 0 0 0   Suicidal thoughts 0 0 0 0   PHQ-9 Score 0 0 0 0   Difficult doing work/chores Not difficult at all        phq 9 is negative  Fall Risk:    11/20/2023   10:27 AM 09/04/2023    8:32 AM 07/28/2023    8:47 AM 06/04/2023    8:48 AM 04/28/2023   11:27 AM  Fall Risk   Falls in the past year? 0 0 0 0 0  Number falls in past yr: 0    0  Injury with Fall? 0    0  Risk for fall due to : No Fall Risks No Fall Risks No Fall Risks No Fall Risks No Fall Risks  Follow up Falls prevention discussed;Education provided;Falls evaluation completed Falls prevention discussed Falls prevention discussed;Education provided;Falls evaluation completed Falls prevention discussed Falls prevention discussed     Assessment and Plan    Puncture wound to right index finger metacarpophalangeal joint Occurred less than 24 hours ago while handling rusty metal fence. The wound is clean and there is no foreign body. Pain and difficulty making a fist due to swelling. Risk of joint infection due to proximity to joint and patient's diabetes. -Start Augmentin  for 7 days for prophylaxis against infection. -Administer tetanus booster today. -Advise patient to monitor for increasing redness, swelling, or pain. If these symptoms occur, patient should present to emergency orthopedic clinic. -Check wound in 1 week or  sooner if symptoms worsen.

## 2023-11-20 NOTE — Telephone Encounter (Signed)
  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 May need antibiotic treatment given nature of injury.

## 2023-12-03 ENCOUNTER — Other Ambulatory Visit: Payer: Self-pay

## 2023-12-04 ENCOUNTER — Other Ambulatory Visit: Payer: Self-pay

## 2023-12-05 ENCOUNTER — Other Ambulatory Visit: Payer: Self-pay

## 2023-12-09 ENCOUNTER — Other Ambulatory Visit: Payer: Self-pay

## 2023-12-09 ENCOUNTER — Ambulatory Visit (INDEPENDENT_AMBULATORY_CARE_PROVIDER_SITE_OTHER): Payer: 59 | Admitting: Family Medicine

## 2023-12-09 ENCOUNTER — Encounter: Payer: Self-pay | Admitting: Family Medicine

## 2023-12-09 VITALS — BP 134/82 | HR 89 | Temp 97.9°F | Resp 16 | Ht 68.0 in | Wt 247.9 lb

## 2023-12-09 DIAGNOSIS — E559 Vitamin D deficiency, unspecified: Secondary | ICD-10-CM

## 2023-12-09 DIAGNOSIS — E785 Hyperlipidemia, unspecified: Secondary | ICD-10-CM | POA: Diagnosis not present

## 2023-12-09 DIAGNOSIS — Z7984 Long term (current) use of oral hypoglycemic drugs: Secondary | ICD-10-CM

## 2023-12-09 DIAGNOSIS — E1159 Type 2 diabetes mellitus with other circulatory complications: Secondary | ICD-10-CM

## 2023-12-09 DIAGNOSIS — E1129 Type 2 diabetes mellitus with other diabetic kidney complication: Secondary | ICD-10-CM

## 2023-12-09 DIAGNOSIS — E1169 Type 2 diabetes mellitus with other specified complication: Secondary | ICD-10-CM | POA: Diagnosis not present

## 2023-12-09 DIAGNOSIS — I152 Hypertension secondary to endocrine disorders: Secondary | ICD-10-CM

## 2023-12-09 DIAGNOSIS — K219 Gastro-esophageal reflux disease without esophagitis: Secondary | ICD-10-CM

## 2023-12-09 DIAGNOSIS — R809 Proteinuria, unspecified: Secondary | ICD-10-CM

## 2023-12-09 DIAGNOSIS — F439 Reaction to severe stress, unspecified: Secondary | ICD-10-CM

## 2023-12-09 DIAGNOSIS — G4733 Obstructive sleep apnea (adult) (pediatric): Secondary | ICD-10-CM

## 2023-12-09 LAB — POCT GLYCOSYLATED HEMOGLOBIN (HGB A1C): Hemoglobin A1C: 7.8 % — AB (ref 4.0–5.6)

## 2023-12-09 MED ORDER — ESOMEPRAZOLE MAGNESIUM 40 MG PO CPDR
40.0000 mg | DELAYED_RELEASE_CAPSULE | Freq: Every morning | ORAL | 1 refills | Status: DC
Start: 2023-12-09 — End: 2024-06-02
  Filled 2023-12-09 – 2023-12-15 (×7): qty 90, 90d supply, fill #0
  Filled 2023-12-19 – 2024-03-12 (×2): qty 90, 90d supply, fill #1

## 2023-12-09 MED ORDER — METFORMIN HCL ER 750 MG PO TB24
1500.0000 mg | ORAL_TABLET | Freq: Every day | ORAL | 1 refills | Status: DC
  Filled 2023-12-09: qty 180, 90d supply, fill #0
  Filled 2024-03-12: qty 180, 90d supply, fill #1

## 2023-12-09 MED ORDER — LOVASTATIN 40 MG PO TABS
40.0000 mg | ORAL_TABLET | Freq: Every day | ORAL | 0 refills | Status: DC
  Filled 2023-12-09: qty 90, 90d supply, fill #0

## 2023-12-09 MED ORDER — PIOGLITAZONE HCL 15 MG PO TABS
15.0000 mg | ORAL_TABLET | Freq: Every day | ORAL | 1 refills | Status: DC
  Filled 2023-12-09: qty 90, 90d supply, fill #0
  Filled 2024-03-12: qty 90, 90d supply, fill #1

## 2023-12-09 MED ORDER — TRULICITY 4.5 MG/0.5ML ~~LOC~~ SOAJ
4.5000 mg | SUBCUTANEOUS | 1 refills | Status: DC
  Filled 2023-12-09: qty 6, 84d supply, fill #0
  Filled 2024-03-12: qty 6, 84d supply, fill #1

## 2023-12-09 NOTE — Progress Notes (Signed)
 Name: Trevor Dennis.   MRN: [id number]    DOB: 02/23/1971   Date:12/09/2023       Progress Note  Subjective  Chief Complaint  Chief Complaint  Patient presents with   Medical Management of Chronic Issues   HPI   Diabetes Type 2 : A1C  January 2020 was 8%, A1C has gone as high at 9.7%. His A1C went up from 6.8 % to 7.1 , 7 % and today is up to 7.3% , currently on Trulicity 3 mg ,  1500 mg of Metformin and Actos 15 mg. He denies polyphagia, polydipsia or polyuria. He has obesity, fatty liver, dyslipidemia, microalbuminuria.  ED  associated with DM. He was taking lisinopril  for microalbuminuria however bp dropped and he is off medication now. He quit smoking . A1C was  up to 8 %, nausea resolved and we increased to 4.5 mg weekly and A1C is down to 7.8 % but he just started the new dose about 4 weeks ago. We will continue current dose    Chronic neck/Back pain/OA knee and elbow: he went to [facility] but out of network, he wen to [place], he states he was told he has OA. He states he is aching all the time. He is only taking Meloxicam prn now , sometimes Tylenol . Stable symptoms    Fatty liver; he had [place] in 2018, his last liver enzymes normalized but we will continue to monitor . Taking Actos   Hyperlipidemia:last LDL was 61 , we will recheck labs, taking Mevacor and denies side effects   Morbid Obesity: BMI over 35 with co-morbidities such as DM, HTN and dyslipidemia, weight is trending down. Weight is down 3 more pounds since last visit    GERD: He states regurgitation improved with mouthguard for GERD and also Nexium, he has tried and failed Omeprazole, Ranitidine.    OSA: he finally had sleep study done in May 2024  it was positive, orders signed but he never got the supplies. 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 Currently dating , ED medication causes him to flush and does not like it Discussed referral to Urologist but he is not interested at this time.  Girlfriend is okay with the current situation    Patient Active Problem List   Diagnosis Date Noted   Diabetes mellitus with microalbuminuria (HCC) 02/24/2023   Osteoarthritis, generalized 07/26/2021   HTN (hypertension), benign 07/26/2021   Chronic neck pain 11/17/2018   History of lumbar discectomy 03/04/2018   Hypotestosteronism 11/26/2017   History of subarachnoid hemorrhage 09/17/2017   Vitamin D insufficiency 08/26/2017   GERD (gastroesophageal reflux disease) 10/25/2016   Morbid obesity (HCC) 10/25/2016   ED (erectile dysfunction) of organic origin 02/20/2016   Anxiety 01/01/2016   Elevated liver enzymes 07/27/2015   Intermittent low back pain 07/27/2015   Fatty liver 05/03/2015   Dyslipidemia associated with type 2 diabetes mellitus (HCC) 04/24/2015   HLD (hyperlipidemia) 04/24/2015   Fever blister 04/24/2015    Past Surgical History:  Procedure Laterality Date   BACK SURGERY  8 years ago    Family History  Problem Relation Age of Onset   Diabetes Mother  Cancer Father 72       lung cancer   Lung cancer Father    Liver disease Brother    Alcohol abuse Brother    ADD / ADHD Son    ADD / ADHD Son     Social History   Tobacco Use   Smoking status: Former    Current packs/day: 0.00    Types: Cigarettes    Quit date: 02/15/1990    Years since quitting: 33.8   Smokeless tobacco: Never  Substance Use Topics   Alcohol use: Yes    Alcohol/week: 0.0 standard drinks of alcohol    Comment: Occasional      Current Outpatient Medications:    ALPRAZolam (XANAX) 0.5 MG tablet, Take 1 tablet (0.5 mg total) by mouth daily as needed., Disp: 30 tablet, Rfl: 0   cetirizine (ZYRTEC) 10 MG tablet, Take 1 tablet (10 mg total) by mouth daily.,  Disp: 90 tablet, Rfl: 0   EPINEPHrine 0.3 mg/0.3 mL IJ SOAJ injection, Inject 0.3 mg into the muscle as needed for anaphylaxis., Disp: 2 each, Rfl: 0   Fluocinonide 0.1 % CREA, Apply 1 g topically 2 (two) times daily., Disp: 120 g, Rfl: 0   hydrOXYzine (ATARAX) 10 MG tablet, Take 1 tablet (10 mg total) by mouth at bedtime as needed., Disp: 30 tablet, Rfl: 0   meloxicam (MOBIC) 15 MG tablet, Take 1 tablet (15 mg total) by mouth daily as needed for pain. Take tylenol instead when possible, Disp: 90 tablet, Rfl: 0   Multiple Vitamin (MULTI VITAMIN DAILY PO), Take by mouth daily. , Disp: , Rfl:    valACYclovir (VALTREX) 1000 MG tablet, Take 1 tablet (1,000 mg total) by mouth 2 (two) times daily., Disp: 20 tablet, Rfl: 0   Dulaglutide (TRULICITY) 4.5 MG/0.5ML SOAJ, Inject 4.5 mg into the skin once a week., Disp: 6 mL, Rfl: 1   esomeprazole (NEXIUM) 40 MG capsule, Take 1 capsule (40 mg total) by mouth in the morning., Disp: 90 capsule, Rfl: 1   lovastatin (MEVACOR) 40 MG tablet, Take 1 tablet (40 mg total) by mouth at bedtime., Disp: 90 tablet, Rfl: 0   metFORMIN (GLUCOPHAGE-XR) 750 MG 24 hr tablet, Take 2 tablets (1,500 mg total) by mouth daily with breakfast., Disp: 180 tablet, Rfl: 1   pioglitazone (ACTOS) 15 MG tablet, Take 1 tablet (15 mg total) by mouth daily., Disp: 90 tablet, Rfl: 1  No Known Allergies  I personally reviewed active problem list, medication list, allergies, family history, social history with the patient/caregiver today.   ROS  Ten systems reviewed and is negative except as mentioned in HPI    Objective  Vitals:   12/09/23 0855  BP: 134/82  Pulse: 89  Resp: 16  Temp: 97.9 F (36.6 C)  TempSrc: Oral  SpO2: 97%  Weight: 247 lb 14.4 oz (112.4 kg)  Height: 5\' 8"  (1.727 m)    Body mass index is 37.69 kg/m.  Physical Exam  Constitutional: Patient appears well-developed and well-nourished. Obese  No distress.  HEENT: head atraumatic, normocephalic, pupils equal  and reactive to light, neck supple Cardiovascular: Normal rate, regular rhythm and normal heart sounds.  No murmur heard. No BLE edema. Pulmonary/Chest: Effort normal and breath sounds normal. No respiratory distress. Abdominal: Soft.  There is no tenderness. Psychiatric: Patient has a normal mood and affect. behavior is normal. Judgment and thought content normal.   Recent Results (from the past 2160 hours)  POCT glycosylated hemoglobin (Hb A1C)  Status: Abnormal   Collection Time: 12/09/23  8:59 AM  Result Value Ref Range   Hemoglobin A1C 7.8 (A) 4.0 - 5.6 %   HbA1c POC (<> result, manual entry)     HbA1c, POC (prediabetic range)     HbA1c, POC (controlled diabetic range)      Diabetic Foot Exam:     PHQ2/9:    12/09/2023    8:55 AM 11/20/2023   10:27 AM 09/04/2023    8:32 AM 07/28/2023    8:47 AM 06/04/2023    8:48 AM  Depression screen PHQ 2/9  Decreased Interest 0 0 0 0 0  Down, Depressed, Hopeless 0 0 0 0 0  PHQ - 2 Score 0 0 0 0 0  Altered sleeping 0 0 0 0 0  Tired, decreased energy 0 0 0 0 0  Change in appetite 0 0 0 0 0  Feeling bad or failure about yourself  0 0 0 0 0  Trouble concentrating 0 0 0 0 0  Moving slowly or fidgety/restless 0 0 0 0 0  Suicidal thoughts 0 0 0 0 0  PHQ-9 Score 0 0 0 0 0  Difficult doing work/chores Not difficult at all Not difficult at all       phq 9 is negative  Fall Risk:    11/20/2023   10:27 AM 09/04/2023    8:32 AM 07/28/2023    8:47 AM 06/04/2023    8:48 AM 04/28/2023   11:27 AM  Fall Risk   Falls in the past year? 0 0 0 0 0  Number falls in past yr: 0    0  Injury with Fall? 0    0  Risk for fall due to : No Fall Risks No Fall Risks No Fall Risks No Fall Risks No Fall Risks  Follow up Falls prevention discussed;Education provided;Falls evaluation completed Falls prevention discussed Falls prevention discussed;Education provided;Falls evaluation completed Falls prevention discussed Falls prevention discussed      Assessment & Plan  1. Dyslipidemia associated with type 2 diabetes mellitus (HCC) (Primary)  - POCT glycosylated hemoglobin (Hb A1C) - Microalbumin / creatinine urine ratio - CBC with Differential/Platelet - COMPLETE METABOLIC PANEL WITH GFR - Lipid panel - Dulaglutide (TRULICITY) 4.5 MG/0.5ML SOAJ; Inject 4.5 mg into the skin once a week.  Dispense: 6 mL; Refill: 1 - metFORMIN (GLUCOPHAGE-XR) 750 MG 24 hr tablet; Take 2 tablets (1,500 mg total) by mouth daily with breakfast.  Dispense: 180 tablet; Refill: 1 - lovastatin (MEVACOR) 40 MG tablet; Take 1 tablet (40 mg total) by mouth at bedtime.  Dispense: 90 tablet; Refill: 0 - pioglitazone (ACTOS) 15 MG tablet; Take 1 tablet (15 mg total) by mouth daily.  Dispense: 90 tablet; Refill: 1  2. Morbid obesity (HCC)  Discussed with the patient the risk posed by an increased BMI. Discussed importance of portion control, calorie counting and at least 150 minutes of physical activity weekly. Avoid sweet beverages and drink more water. Eat at least 6 servings of fruit and vegetables daily    3. Obesity, diabetes, and hypertension syndrome (HCC)  Losing weight, continue medications  4. Diabetes mellitus with microalbuminuria (HCC)  - metFORMIN (GLUCOPHAGE-XR) 750 MG 24 hr tablet; Take 2 tablets (1,500 mg total) by mouth daily with breakfast.  Dispense: 180 tablet; Refill: 1 - pioglitazone (ACTOS) 15 MG tablet; Take 1 tablet (15 mg total) by mouth daily.  Dispense: 90 tablet; Refill: 1  5. Gastroesophageal reflux disease without esophagitis  - esomeprazole (  NEXIUM) 40 MG capsule; Take 1 capsule (40 mg total) by mouth in the morning.  Dispense: 90 capsule; Refill: 1  6. Obstructive sleep apnea  stable  7. Vitamin D insufficiency  Continue supplementation  8. Stress   Due to being hacked - worried about what to do next

## 2023-12-10 ENCOUNTER — Other Ambulatory Visit: Payer: Self-pay

## 2023-12-11 ENCOUNTER — Encounter: Payer: Self-pay | Admitting: Family Medicine

## 2023-12-11 LAB — MICROALBUMIN / CREATININE URINE RATIO
Creatinine, Urine: 161 mg/dL (ref 20–320)
Microalb Creat Ratio: 37 mg/g{creat} — ABNORMAL HIGH (ref <30)
Microalb, Ur: 5.9 mg/dL

## 2023-12-11 LAB — CBC WITH DIFFERENTIAL/PLATELET
Absolute Lymphocytes: 2766 {cells}/uL (ref 850–3900)
Absolute Monocytes: 509 {cells}/uL (ref 200–950)
Basophils Absolute: 106 {cells}/uL (ref 0–200)
Basophils Relative: 1.4 %
Eosinophils Absolute: 220 {cells}/uL (ref 15–500)
Eosinophils Relative: 2.9 %
HCT: 40.7 % (ref 38.5–50.0)
Hemoglobin: 13.3 g/dL (ref 13.2–17.1)
MCH: 29.7 pg (ref 27.0–33.0)
MCHC: 32.7 g/dL (ref 32.0–36.0)
MCV: 90.8 fL (ref 80.0–100.0)
MPV: 11.7 fL (ref 7.5–12.5)
Monocytes Relative: 6.7 %
Neutro Abs: 3998 {cells}/uL (ref 1500–7800)
Neutrophils Relative %: 52.6 %
Platelets: 243 10*3/uL (ref 140–400)
RBC: 4.48 10*6/uL (ref 4.20–5.80)
RDW: 12.1 % (ref 11.0–15.0)
Total Lymphocyte: 36.4 %
WBC: 7.6 10*3/uL (ref 3.8–10.8)

## 2023-12-11 LAB — LIPID PANEL
Cholesterol: 142 mg/dL (ref <200)
HDL: 58 mg/dL (ref >=40)
LDL Cholesterol (Calc): 68 mg/dL
Non-HDL Cholesterol (Calc): 84 mg/dL (ref <130)
Total CHOL/HDL Ratio: 2.4 (calc) (ref <5.0)
Triglycerides: 80 mg/dL (ref <150)

## 2023-12-11 LAB — COMPLETE METABOLIC PANEL WITH GFR
AG Ratio: 1.6 (calc) (ref 1.0–2.5)
ALT: 43 U/L (ref 9–46)
AST: 25 U/L (ref 10–35)
Albumin: 4.4 g/dL (ref 3.6–5.1)
Alkaline phosphatase (APISO): 43 U/L (ref 35–144)
BUN: 22 mg/dL (ref 7–25)
CO2: 27 mmol/L (ref 20–32)
Calcium: 9.5 mg/dL (ref 8.6–10.3)
Chloride: 105 mmol/L (ref 98–110)
Creat: 1.24 mg/dL (ref 0.70–1.30)
Globulin: 2.8 g/dL (ref 1.9–3.7)
Glucose, Bld: 166 mg/dL — ABNORMAL HIGH (ref 65–99)
Potassium: 5.1 mmol/L (ref 3.5–5.3)
Sodium: 140 mmol/L (ref 135–146)
Total Bilirubin: 0.4 mg/dL (ref 0.2–1.2)
Total Protein: 7.2 g/dL (ref 6.1–8.1)
eGFR: 70 mL/min/{1.73_m2} (ref >=60)

## 2023-12-12 ENCOUNTER — Other Ambulatory Visit: Payer: Self-pay

## 2023-12-14 ENCOUNTER — Other Ambulatory Visit: Payer: Self-pay

## 2023-12-15 ENCOUNTER — Other Ambulatory Visit: Payer: Self-pay

## 2023-12-16 ENCOUNTER — Other Ambulatory Visit: Payer: Self-pay

## 2023-12-19 ENCOUNTER — Other Ambulatory Visit: Payer: Self-pay

## 2024-01-06 ENCOUNTER — Encounter: Payer: Self-pay | Admitting: Family Medicine

## 2024-01-06 ENCOUNTER — Ambulatory Visit: Admitting: Family Medicine

## 2024-01-06 ENCOUNTER — Other Ambulatory Visit: Payer: Self-pay

## 2024-01-06 VITALS — BP 126/72 | HR 98 | Resp 16 | Ht 68.0 in | Wt 247.6 lb

## 2024-01-06 DIAGNOSIS — L255 Unspecified contact dermatitis due to plants, except food: Secondary | ICD-10-CM

## 2024-01-06 DIAGNOSIS — Z7984 Long term (current) use of oral hypoglycemic drugs: Secondary | ICD-10-CM

## 2024-01-06 DIAGNOSIS — R809 Proteinuria, unspecified: Secondary | ICD-10-CM

## 2024-01-06 DIAGNOSIS — E1129 Type 2 diabetes mellitus with other diabetic kidney complication: Secondary | ICD-10-CM | POA: Diagnosis not present

## 2024-01-06 MED ORDER — PREDNISONE 10 MG PO TABS
ORAL_TABLET | ORAL | 0 refills | Status: AC
Start: 2024-01-06 — End: 2024-01-21
  Filled 2024-01-06: qty 45, 15d supply, fill #0

## 2024-01-06 MED ORDER — TRESIBA FLEXTOUCH 100 UNIT/ML ~~LOC~~ SOPN
4.0000 [IU] | PEN_INJECTOR | Freq: Every day | SUBCUTANEOUS | 0 refills | Status: DC
  Filled 2024-01-06: qty 15, 150d supply, fill #0

## 2024-01-06 MED ORDER — INSULIN PEN NEEDLE 32G X 6 MM MISC
1.0000 | Freq: Every day | 0 refills | Status: DC
Start: 2024-01-06 — End: 2024-03-09
  Filled 2024-01-06 (×2): qty 100, 90d supply, fill #0

## 2024-01-06 NOTE — Progress Notes (Signed)
 Name: Trevor Dennis.   MRN: [id number]    DOB: 07-05-1971   Date:01/06/2024       Progress Note  Subjective  Chief Complaint  Chief Complaint  Patient presents with   Rash    Poison ivy onset for a week spread all over body except r arm    Discussed the use of AI scribe software for clinical note transcription with the patient, who gave verbal consent to proceed.  History of Present Illness Trevor Dennis. is a 53 year old male who presents with a recurrent poison oak rash.  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 The current episode began approximately five days ago, with the rash located between his fingers, extending down his arm, on his neck, belly, and hips. He describes the rash as 'real bad' and spreading. The exposure happened while cutting trees in the woods, where poison oak was wrapped around the trees. For symptom relief, he has been taking Zyrtec for itching, but it is not fully effective. He has also tried wearing long sleeves to prevent scratching. Zyrtec does not make him sleepy, unlike other antihistamines he has tried.  He has a history of diabetes with a recent A1c of 7.8. His recent blood sugar readings have been around 138 mg/dL, but there is a concerned about potential increases due to the current treatment for his rash. His current diabetes medications include Trulicity 4.5 mg and Metformin 750 mg. He reports that he does not have any insulin left at home.    Patient Active Problem List   Diagnosis Date Noted   Diabetes mellitus with microalbuminuria (HCC) 02/24/2023   Osteoarthritis, generalized 07/26/2021   HTN (hypertension), benign 07/26/2021   Chronic neck pain 11/17/2018   History of lumbar discectomy 03/04/2018   Hypotestosteronism 11/26/2017   History of subarachnoid hemorrhage 09/17/2017   Vitamin D insufficiency 08/26/2017   GERD (gastroesophageal reflux disease) 10/25/2016   Morbid obesity (HCC)  10/25/2016   ED (erectile dysfunction) of organic origin 02/20/2016   Anxiety 01/01/2016   Elevated liver enzymes 07/27/2015   Intermittent low back pain 07/27/2015   Fatty liver 05/03/2015   Dyslipidemia associated with type 2 diabetes mellitus (HCC) 04/24/2015   HLD (hyperlipidemia) 04/24/2015   Fever blister 04/24/2015    Social History   Tobacco Use   Smoking status: Former    Current packs/day: 0.00    Types: Cigarettes    Quit date: 02/15/1990    Years since quitting: 33.9   Smokeless tobacco: Never  Substance Use Topics   Alcohol use: Yes    Alcohol/week: 0.0 standard drinks of alcohol    Comment: Occasional      Current Outpatient Medications:    ALPRAZolam (XANAX) 0.5 MG tablet, Take 1 tablet (0.5 mg total) by mouth daily as needed., Disp: 30 tablet, Rfl: 0   cetirizine (ZYRTEC) 10 MG tablet, Take 1 tablet (10 mg total) by mouth daily., Disp: 90 tablet, Rfl: 0   Dulaglutide (TRULICITY) 4.5 MG/0.5ML SOAJ, Inject 4.5 mg into the skin once a week., Disp: 6 mL, Rfl: 1   EPINEPHrine 0.3 mg/0.3 mL IJ SOAJ injection, Inject 0.3 mg into the muscle as needed for anaphylaxis., Disp: 2 each, Rfl: 0   esomeprazole (NEXIUM) 40 MG capsule, Take 1 capsule (40 mg total) by mouth in the morning., Disp: 90 capsule, Rfl: 1   Fluocinonide 0.1 % CREA, Apply 1 g topically 2 (two) times daily., Disp: 120 g, Rfl:  0   hydrOXYzine (ATARAX) 10 MG tablet, Take 1 tablet (10 mg total) by mouth at bedtime as needed., Disp: 30 tablet, Rfl: 0   lovastatin (MEVACOR) 40 MG tablet, Take 1 tablet (40 mg total) by mouth at bedtime., Disp: 90 tablet, Rfl: 0   meloxicam (MOBIC) 15 MG tablet, Take 1 tablet (15 mg total) by mouth daily as needed for pain. Take tylenol instead when possible, Disp: 90 tablet, Rfl: 0   metFORMIN (GLUCOPHAGE-XR) 750 MG 24 hr tablet, Take 2 tablets (1,500 mg total) by mouth daily with breakfast., Disp: 180 tablet, Rfl: 1   Multiple Vitamin (MULTI VITAMIN DAILY PO), Take by mouth  daily. , Disp: , Rfl:    pioglitazone (ACTOS) 15 MG tablet, Take 1 tablet (15 mg total) by mouth daily., Disp: 90 tablet, Rfl: 1   valACYclovir (VALTREX) 1000 MG tablet, Take 1 tablet (1,000 mg total) by mouth 2 (two) times daily., Disp: 20 tablet, Rfl: 0  No Known Allergies  ROS  Ten systems reviewed and is negative except as mentioned in HPI    Objective  Vitals:   01/06/24 1120  BP: 126/72  Pulse: 98  Resp: 16  SpO2: 100%  Weight: 247 lb 9.6 oz (112.3 kg)  Height: 5\' 8"  (1.727 m)    Body mass index is 37.65 kg/m.    Physical Exam  Constitutional: Patient appears well-developed and well-nourished. Obese  No distress.  HEENT: head atraumatic, normocephalic, pupils equal and reactive to light, neck supple Cardiovascular: Normal rate, regular rhythm and normal heart sounds.  No murmur heard. No BLE edema. Pulmonary/Chest: Effort normal and breath sounds normal. No respiratory distress. Abdominal: Soft.  There is no tenderness. Skin: erythematous rash is clusters, no oozing, on left hand, left arm, left flank , groin and nuchal area Psychiatric: Patient has a normal mood and affect. behavior is normal. Judgment and thought content normal.   Recent Results (from the past 2160 hours)  POCT glycosylated hemoglobin (Hb A1C)     Status: Abnormal   Collection Time: 12/09/23  8:59 AM  Result Value Ref Range   Hemoglobin A1C 7.8 (A) 4.0 - 5.6 %   HbA1c POC (<> result, manual entry)     HbA1c, POC (prediabetic range)     HbA1c, POC (controlled diabetic range)    Microalbumin / creatinine urine ratio     Status: Abnormal   Collection Time: 12/10/23  9:12 AM  Result Value Ref Range   Creatinine, Urine 161 20 - 320 mg/dL   Microalb, Ur 5.9 mg/dL    Comment: Reference Range Not established    Microalb Creat Ratio 37 (H) <30 mg/g creat    Comment: . The ADA defines abnormalities in albumin excretion as follows: Marland Kitchen Albuminuria Category        Result (mg/g  creatinine) . Normal to Mildly increased   <30 Moderately increased         30-299  Severely increased           > OR = 300 . The ADA recommends that at least two of three specimens collected within a 3-6 month period be abnormal before considering a patient to be within a diagnostic category.   CBC with Differential/Platelet     Status: None   Collection Time: 12/10/23  9:12 AM  Result Value Ref Range   WBC 7.6 3.8 - 10.8 Thousand/uL   RBC 4.48 4.20 - 5.80 Million/uL   Hemoglobin 13.3 13.2 - 17.1 g/dL   HCT 40.7  38.5 - 50.0 %   MCV 90.8 80.0 - 100.0 fL   MCH 29.7 27.0 - 33.0 pg   MCHC 32.7 32.0 - 36.0 g/dL    Comment: For adults, a slight decrease in the calculated MCHC value (in the range of 30 to 32 g/dL) is most likely not clinically significant; however, it should be interpreted with caution in correlation with other red cell parameters and the patient's clinical condition.    RDW 12.1 11.0 - 15.0 %   Platelets 243 140 - 400 Thousand/uL   MPV 11.7 7.5 - 12.5 fL   Neutro Abs 3,998 1,500 - 7,800 cells/uL   Absolute Lymphocytes 2,766 850 - 3,900 cells/uL   Absolute Monocytes 509 200 - 950 cells/uL   Eosinophils Absolute 220 15 - 500 cells/uL   Basophils Absolute 106 0 - 200 cells/uL   Neutrophils Relative % 52.6 %   Total Lymphocyte 36.4 %   Monocytes Relative 6.7 %   Eosinophils Relative 2.9 %   Basophils Relative 1.4 %  COMPLETE METABOLIC PANEL WITH GFR     Status: Abnormal   Collection Time: 12/10/23  9:12 AM  Result Value Ref Range   Glucose, Bld 166 (H) 65 - 99 mg/dL    Comment: .            Fasting reference interval . For someone without known diabetes, a glucose value >125 mg/dL indicates that they may have diabetes and this should be confirmed with a follow-up test. .    BUN 22 7 - 25 mg/dL   Creat 1.61 0.96 - 0.45 mg/dL   eGFR 70 > OR = 60 WU/JWJ/1.91Y7   BUN/Creatinine Ratio SEE NOTE: 6 - 22 (calc)    Comment:    Not Reported: BUN and Creatinine  are within    reference range. .    Sodium 140 135 - 146 mmol/L   Potassium 5.1 3.5 - 5.3 mmol/L   Chloride 105 98 - 110 mmol/L   CO2 27 20 - 32 mmol/L   Calcium 9.5 8.6 - 10.3 mg/dL   Total Protein 7.2 6.1 - 8.1 g/dL   Albumin 4.4 3.6 - 5.1 g/dL   Globulin 2.8 1.9 - 3.7 g/dL (calc)   AG Ratio 1.6 1.0 - 2.5 (calc)   Total Bilirubin 0.4 0.2 - 1.2 mg/dL   Alkaline phosphatase (APISO) 43 35 - 144 U/L   AST 25 10 - 35 U/L   ALT 43 9 - 46 U/L  Lipid panel     Status: None   Collection Time: 12/10/23  9:12 AM  Result Value Ref Range   Cholesterol 142 <200 mg/dL   HDL 58 > OR = 40 mg/dL   Triglycerides 80 <829 mg/dL   LDL Cholesterol (Calc) 68 mg/dL (calc)    Comment: Reference range: <100 . Desirable range <100 mg/dL for primary prevention;   <70 mg/dL for patients with CHD or diabetic patients  with > or = 2 CHD risk factors. Marland Kitchen LDL-C is now calculated using the Martin-Hopkins  calculation, which is a validated novel method providing  better accuracy than the Friedewald equation in the  estimation of LDL-C.  Horald Pollen et al. Lenox Ahr. 5621;308(65): 2061-2068  (http://education.[facility].com/faq/FAQ164)    Total CHOL/HDL Ratio 2.4 <5.0 (calc)   Non-HDL Cholesterol (Calc) 84 <784 mg/dL (calc)    Comment: For patients with diabetes plus 1 major ASCVD risk  factor, treating to a non-HDL-C goal of <100 mg/dL  (LDL-C of <69 mg/dL) is considered a  therapeutic  option.       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 Prednisone taper preferred for prolonged systemic effect. - Prescribe 42-day prednisone taper: 6 tablets BID for 3 days, 5 tablets BID for 3 days, 4 tablets BID for 3 days, 3 tablets BID for 3 days, 2 tablets BID for 3 days, 1 tablet BID for 3 days, then 1 tablet daily for 3 days. - Advise washing clothes and bed linens. - Recommend Zyrtec at night and Claritin in the morning for pruritus. -  Advise against topical medications, maintain cleanliness of affected areas. - Discuss Benadryl for pruritus, noting sedative effects.  Type 2 Diabetes Mellitus Suboptimal control with HbA1c of 7.8%. Prednisone may elevate glucose levels. Tresiba insulin chosen for cost-effectiveness and glucose management. - Prescribe Tresiba insulin, starting with 4 units if fasting glucose >140 mg/dL, adjust 1-61 units as needed. - Advise [employer] Fine needles for insulin. - Emphasize reducing carbohydrate intake. - Consider providing a sample of long-acting insulin if available.

## 2024-01-28 ENCOUNTER — Other Ambulatory Visit: Payer: Self-pay

## 2024-01-28 ENCOUNTER — Other Ambulatory Visit: Payer: Self-pay | Admitting: Family Medicine

## 2024-01-28 DIAGNOSIS — B001 Herpesviral vesicular dermatitis: Secondary | ICD-10-CM

## 2024-01-28 MED ORDER — VALACYCLOVIR HCL 1 G PO TABS
1000.0000 mg | ORAL_TABLET | Freq: Two times a day (BID) | ORAL | 0 refills | Status: DC
  Filled 2024-01-28: qty 20, 10d supply, fill #0

## 2024-03-09 ENCOUNTER — Ambulatory Visit: Payer: 59 | Admitting: Family Medicine

## 2024-03-09 ENCOUNTER — Encounter: Payer: Self-pay | Admitting: Family Medicine

## 2024-03-09 ENCOUNTER — Other Ambulatory Visit: Payer: Self-pay

## 2024-03-09 VITALS — BP 132/76 | HR 78 | Resp 16 | Ht 68.0 in | Wt 245.0 lb

## 2024-03-09 DIAGNOSIS — G8929 Other chronic pain: Secondary | ICD-10-CM

## 2024-03-09 DIAGNOSIS — G4733 Obstructive sleep apnea (adult) (pediatric): Secondary | ICD-10-CM | POA: Diagnosis not present

## 2024-03-09 DIAGNOSIS — R809 Proteinuria, unspecified: Secondary | ICD-10-CM | POA: Diagnosis not present

## 2024-03-09 DIAGNOSIS — E1129 Type 2 diabetes mellitus with other diabetic kidney complication: Secondary | ICD-10-CM

## 2024-03-09 DIAGNOSIS — M109 Gout, unspecified: Secondary | ICD-10-CM

## 2024-03-09 DIAGNOSIS — Z9889 Other specified postprocedural states: Secondary | ICD-10-CM

## 2024-03-09 DIAGNOSIS — D509 Iron deficiency anemia, unspecified: Secondary | ICD-10-CM

## 2024-03-09 DIAGNOSIS — K219 Gastro-esophageal reflux disease without esophagitis: Secondary | ICD-10-CM

## 2024-03-09 DIAGNOSIS — E1169 Type 2 diabetes mellitus with other specified complication: Secondary | ICD-10-CM | POA: Diagnosis not present

## 2024-03-09 DIAGNOSIS — E785 Hyperlipidemia, unspecified: Secondary | ICD-10-CM

## 2024-03-09 DIAGNOSIS — M25561 Pain in right knee: Secondary | ICD-10-CM

## 2024-03-09 LAB — URIC ACID: Uric Acid, Serum: 8.5 mg/dL — ABNORMAL HIGH (ref 4.0–8.0)

## 2024-03-09 LAB — POCT GLYCOSYLATED HEMOGLOBIN (HGB A1C): Hemoglobin A1C: 8.3 % — AB (ref 4.0–5.6)

## 2024-03-09 MED ORDER — INSULIN PEN NEEDLE 32G X 6 MM MISC
1.0000 | Freq: Every day | 0 refills | Status: AC
  Filled 2024-03-09: qty 100, 100d supply, fill #0

## 2024-03-09 MED ORDER — TRESIBA FLEXTOUCH 100 UNIT/ML ~~LOC~~ SOPN
10.0000 [IU] | PEN_INJECTOR | Freq: Every day | SUBCUTANEOUS | 0 refills | Status: DC
  Filled 2024-03-09: qty 15, 30d supply, fill #0

## 2024-03-09 NOTE — Patient Instructions (Signed)

## 2024-03-09 NOTE — Progress Notes (Signed)
 Name: Trevor Dennis.   MRN: [id number]    DOB: Aug 25, 1971   Date:03/09/2024       Progress Note  Subjective  Chief Complaint  Chief Complaint  Patient presents with   Medical Management of Chronic Issues   Discussed the use of AI scribe software for clinical note transcription with the patient, who gave verbal consent to proceed.  History of Present Illness Trevor Dennis. is a 53 year old male with diabetes who presents with right big toe pain.  He has been experiencing sharp, aching pain in his right big toe for the past two months, which he associates with consuming shrimp. The pain is accompanied by swelling around the big toe, particularly on the bunion, and is described as very tender. He is unable to move the toe and prefers to stay barefoot instead of wearing shoes. During these episodes, the toe becomes warm but not red.  He has type 2  diabetes and is currently taking Trulicity  at 4.5 mg, metformin  750 mg twice daily, and pioglitazone . His recent A1c level is 8.3%, which is higher than previous levels of 7.8% in February, 8% in November, and 7.3% in August of the previous year. His blood sugar levels typically range from 128 to 156 mg/dL in the morning. 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 He experiences intermittent back pain, which is less severe than before the surgery.  He reports chronic pain in his right knee and shoulder, with the shoulder pain sometimes preventing him from lifting his arms. He has received cortisone shots for his shoulder pain, which have been limited in dosage due to his diabetes.  He has obstructive sleep apnea but does not use a CPAP machine due to cost. Instead, he uses a Z Quiet mouthpiece, which he replaces every four months, and reports that it helps with his symptoms.  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  He is currently managing his weight, having recently weighed 239 pounds, and acknowledges the need to eat better to aid in weight loss and diabetes management. No low blood sugar symptoms unless he goes a long time without eating.    GERD: He states regurgitation improved with mouthguard for GERD and also Nexium , he has tried and failed Omeprazole, Ranitidine.    OSA: he finally had sleep study done in May 2024  it was positive, orders signed but he never got the supplies. He is now wearing a mouth guard - Z-quiet ,patient states machine it too costly    Hypogonadism: levels were low in the past , feels tired, he does not have libido, he also has ED. Currently dating , ED medication causes him to flush and does not like it Discussed referral to Urologist but he is not interested at this time.  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   Anxiety 01/01/2016   Elevated liver enzymes 07/27/2015   Intermittent low back pain 07/27/2015   Fatty liver 05/03/2015   Dyslipidemia associated with type 2 diabetes mellitus (HCC) 04/24/2015   HLD (hyperlipidemia) 04/24/2015   Fever blister 04/24/2015    Past Surgical History:  Procedure Laterality Date   BACK SURGERY  8 years ago    Family History  Problem  Relation Age of Onset   Diabetes Mother    Cancer Father 35       lung cancer   Lung cancer Father    Liver disease Brother    Alcohol abuse Brother    ADD / ADHD Son    ADD / ADHD Son     Social History   Tobacco Use   Smoking status: Former    Current packs/day: 0.00    Types: Cigarettes    Quit date: 02/15/1990    Years since quitting: 34.0   Smokeless tobacco: Never  Substance Use Topics   Alcohol use: Yes    Alcohol/week: 0.0 standard drinks of alcohol    Comment: Occasional      Current Outpatient Medications:    ALPRAZolam  (XANAX ) 0.5 MG tablet, Take 1 tablet (0.5 mg total) by mouth daily as needed., Disp: 30 tablet, Rfl: 0   cetirizine  (ZYRTEC ) 10 MG tablet, Take 1 tablet (10 mg total) by mouth daily., Disp: 90 tablet, Rfl: 0   Dulaglutide  (TRULICITY ) 4.5 MG/0.5ML SOAJ, Inject 4.5 mg into the skin once a week., Disp: 6 mL, Rfl: 1   EPINEPHrine  0.3 mg/0.3 mL IJ SOAJ injection, Inject 0.3 mg into the muscle as needed for anaphylaxis., Disp: 2 each, Rfl: 0   esomeprazole  (NEXIUM ) 40 MG capsule, Take 1 capsule (40 mg total) by mouth in the morning., Disp: 90 capsule, Rfl: 1   Fluocinonide  0.1 % CREA, Apply 1 g topically 2 (two) times daily., Disp: 120 g, Rfl: 0   hydrOXYzine  (ATARAX ) 10 MG tablet, Take 1 tablet (10 mg total) by mouth at bedtime as needed., Disp: 30 tablet, Rfl: 0   insulin  degludec (TRESIBA  FLEXTOUCH) 100 UNIT/ML FlexTouch Pen, Inject 4-10 Units into the skin daily., Disp: 15 mL, Rfl: 0   Insulin  Pen Needle 32G X 6 MM MISC, 1 each by Does not apply route daily., Disp: 100 each, Rfl: 0   lovastatin  (MEVACOR ) 40 MG tablet, Take 1 tablet (40 mg total) by mouth at bedtime., Disp: 90 tablet, Rfl: 0   meloxicam  (MOBIC ) 15 MG tablet, Take 1 tablet (15 mg total) by mouth daily as needed for pain. Take tylenol instead when possible, Disp: 90 tablet, Rfl: 0   metFORMIN  (GLUCOPHAGE -XR) 750 MG 24 hr tablet, Take 2 tablets (1,500 mg total) by mouth daily with breakfast.,  Disp: 180 tablet, Rfl: 1   Multiple Vitamin (MULTI VITAMIN DAILY PO), Take by mouth daily. , Disp: , Rfl:    pioglitazone  (ACTOS ) 15 MG tablet, Take 1 tablet (15 mg total) by mouth daily., Disp: 90 tablet, Rfl: 1   valACYclovir  (VALTREX ) 1000 MG tablet, Take 1 tablet (1,000 mg total) by mouth 2 (two) times daily., Disp: 20 tablet, Rfl: 0  No Known Allergies  I personally reviewed active problem list, medication list, allergies, family history with the patient/caregiver today.   ROS  Ten systems reviewed and is negative except as mentioned in HPI  Objective Physical Exam Constitutional: Patient appears well-developed and well-nourished. Obese  No distress.  HEENT: head atraumatic, normocephalic, pupils equal and reactive to light, neck supple Cardiovascular: Normal rate, regular rhythm and normal heart sounds.  No murmur heard. No BLE edema. Pulmonary/Chest: Effort normal and breath sounds normal. No respiratory distress. Abdominal: Soft.  There is no tenderness. Psychiatric: Patient has a normal mood and affect. behavior is normal. Judgment and thought content normal.    VITALS: BP- 132/76 MEASUREMENTS: Weight- 239. CONSTITUTIONAL: Patient appears well-developed and well-nourished. No distress. HEENT: Head atraumatic, normocephalic, neck supple. CARDIOVASCULAR: Normal rate, regular rhythm and normal heart sounds. No murmur heard. No BLE edema. PULMONARY: Effort normal and breath sounds normal. No respiratory distress. ABDOMINAL: There is no tenderness or distention. MUSCULOSKELETAL: Normal gait. Without gross motor deficit. PSYCHIATRIC: Patient has a normal mood and affect. Behavior is normal. Judgment and thought content normal. NEUROLOGICAL: Decreased sensation in right big toe.  Vitals:   03/09/24 0842  BP: 132/76  Pulse: 78  Resp: 16  SpO2: 99%  Weight: 245 lb (111.1 kg)  Height: 5\' 8"  (1.727 m)    Body mass index is 37.25 kg/m.  Recent Results (from the past  2160 hours)  Microalbumin / creatinine urine ratio     Status: Abnormal   Collection Time: 12/10/23  9:12 AM  Result Value Ref Range   Creatinine, Urine 161 20 - 320 mg/dL   Microalb, Ur 5.9 mg/dL    Comment: Reference Range Not established    Microalb Creat Ratio 37 (H) <30 mg/g creat    Comment: . The ADA defines abnormalities in albumin excretion as follows: Aaron Aas Albuminuria Category        Result (mg/g creatinine) . Normal to Mildly increased   <30 Moderately increased         30-299  Severely increased           > OR = 300 . The ADA recommends that at least two of three specimens collected within a 3-6 month period be abnormal before considering a patient to be within a diagnostic category.   CBC with Differential/Platelet     Status: None   Collection Time: 12/10/23  9:12 AM  Result Value Ref Range   WBC 7.6 3.8 - 10.8 Thousand/uL   RBC 4.48 4.20 - 5.80 Million/uL   Hemoglobin 13.3 13.2 - 17.1 g/dL   HCT 44.0 10.2 - 72.5 %   MCV 90.8 80.0 - 100.0 fL   MCH 29.7 27.0 - 33.0 pg   MCHC 32.7 32.0 - 36.0 g/dL    Comment: For adults, a slight decrease in the calculated MCHC value (in the range of 30 to 32 g/dL) is most likely not clinically significant; however, it should be interpreted with caution in correlation with other red cell parameters and the patient's clinical condition.    RDW 12.1 11.0 - 15.0 %   Platelets 243 140 - 400 Thousand/uL   MPV 11.7 7.5 - 12.5 fL   Neutro Abs 3,998 1,500 - 7,800 cells/uL   Absolute Lymphocytes 2,766 850 - 3,900 cells/uL   Absolute Monocytes 509 200 - 950 cells/uL   Eosinophils Absolute 220 15 - 500 cells/uL   Basophils Absolute 106 0 - 200 cells/uL   Neutrophils Relative % 52.6 %   Total Lymphocyte 36.4 %   Monocytes Relative 6.7 %   Eosinophils Relative 2.9 %   Basophils Relative 1.4 %  COMPLETE METABOLIC PANEL WITH GFR     Status: Abnormal  Collection Time: 12/10/23  9:12 AM  Result Value Ref Range   Glucose, Bld 166 (H)  65 - 99 mg/dL    Comment: .            Fasting reference interval . For someone without known diabetes, a glucose value >125 mg/dL indicates that they may have diabetes and this should be confirmed with a follow-up test. .    BUN 22 7 - 25 mg/dL   Creat 1.61 0.96 - 0.45 mg/dL   eGFR 70 > OR = 60 WU/JWJ/1.91Y7   BUN/Creatinine Ratio SEE NOTE: 6 - 22 (calc)    Comment:    Not Reported: BUN and Creatinine are within    reference range. .    Sodium 140 135 - 146 mmol/L   Potassium 5.1 3.5 - 5.3 mmol/L   Chloride 105 98 - 110 mmol/L   CO2 27 20 - 32 mmol/L   Calcium  9.5 8.6 - 10.3 mg/dL   Total Protein 7.2 6.1 - 8.1 g/dL   Albumin 4.4 3.6 - 5.1 g/dL   Globulin 2.8 1.9 - 3.7 g/dL (calc)   AG Ratio 1.6 1.0 - 2.5 (calc)   Total Bilirubin 0.4 0.2 - 1.2 mg/dL   Alkaline phosphatase (APISO) 43 35 - 144 U/L   AST 25 10 - 35 U/L   ALT 43 9 - 46 U/L  Lipid panel     Status: None   Collection Time: 12/10/23  9:12 AM  Result Value Ref Range   Cholesterol 142 <200 mg/dL   HDL 58 > OR = 40 mg/dL   Triglycerides 80 <829 mg/dL   LDL Cholesterol (Calc) 68 mg/dL (calc)    Comment: Reference range: <100 . Desirable range <100 mg/dL for primary prevention;   <70 mg/dL for patients with CHD or diabetic patients  with > or = 2 CHD risk factors. Aaron Aas LDL-C is now calculated using the Martin-Hopkins  calculation, which is a validated novel method providing  better accuracy than the Friedewald equation in the  estimation of LDL-C.  Melinda Sprawls et al. Erroll Heard. 5621;308(65): 2061-2068  (http://education.[facility].com/faq/FAQ164)    Total CHOL/HDL Ratio 2.4 <5.0 (calc)   Non-HDL Cholesterol (Calc) 84 <784 mg/dL (calc)    Comment: For patients with diabetes plus 1 major ASCVD risk  factor, treating to a non-HDL-C goal of <100 mg/dL  (LDL-C of <69 mg/dL) is considered a therapeutic  option.     Diabetic Foot Exam:  Diabetic foot exam was performed with the following findings:   No  deformities, ulcerations, or other skin breakdown Normal sensation of 10g monofilament Intact posterior tibialis and dorsalis pedis pulses      PHQ2/9:    01/06/2024   11:22 AM 12/09/2023    8:55 AM 11/20/2023   10:27 AM 09/04/2023    8:32 AM 07/28/2023    8:47 AM  Depression screen PHQ 2/9  Decreased Interest 0 0 0 0 0  Down, Depressed, Hopeless 0 0 0 0 0  PHQ - 2 Score 0 0 0 0 0  Altered sleeping 0 0 0 0 0  Tired, decreased energy 0 0 0 0 0  Change in appetite 0 0 0 0 0  Feeling bad or failure about yourself  0 0 0 0 0  Trouble concentrating 0 0 0 0 0  Moving slowly or fidgety/restless 0 0 0 0 0  Suicidal thoughts 0 0 0 0 0  PHQ-9 Score 0 0 0 0 0  Difficult doing work/chores Not difficult  at all Not difficult at all Not difficult at all      phq 9 is negative  Fall Risk:    11/20/2023   10:27 AM 09/04/2023    8:32 AM 07/28/2023    8:47 AM 06/04/2023    8:48 AM 04/28/2023   11:27 AM  Fall Risk   Falls in the past year? 0 0 0 0 0  Number falls in past yr: 0    0  Injury with Fall? 0    0  Risk for fall due to : No Fall Risks No Fall Risks No Fall Risks No Fall Risks No Fall Risks  Follow up Falls prevention discussed;Education provided;Falls evaluation completed Falls prevention discussed Falls prevention discussed;Education provided;Falls evaluation completed Falls prevention discussed Falls prevention discussed     Assessment & Plan Type 2 diabetes mellitus with hyperglycemia A1c elevated at 8.3%, indicating poor glycemic control. Blood glucose levels range from 128 to 156 mg/dL upon waking. Discussed insulin  use for better control and emphasized avoiding overeating during hypoglycemia. - Continue Trulicity , metformin , and pioglitazone . - Prescribe insulin  (Tresiba ) starting at 4 units daily, increase to 5 units if fasting glucose remains above 140 mg/dL. - Ensure prescription for insulin  needles. - Educate on managing hypoglycemia with small snacks. - Aim for A1c goal  of 6.5% to 7%.  Obesity Morbid  BMI over 35. Weight management crucial for diabetes control and overall health. - Encourage dietary modifications to support weight loss. - Discuss importance of weight loss in managing diabetes.  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 Discussed diabetes and high uric acid relationship. - Order uric acid level test. - Prescribe medication to lower uric acid if levels are high. - Prescribe medication for acute gout attacks as needed.  Chronic pain in right knee and shoulder Chronic pain with shoulder pain limiting movement. Discussed risk of frozen shoulder and importance of physical therapy. - Provide information on home exercises for shoulder rehabilitation. - Encourage regular physical therapy exercises to prevent frozen shoulder.  Obstructive sleep apnea Uses Z Quiet mouthpiece instead of CPAP due to cost concerns. Mouthpiece reduces snoring. - Continue using the Z Quiet mouthpiece. - Discuss potential benefits of CPAP if cost becomes manageable.  Dyslipidemia On lovastatin  for cholesterol management without side effects. - Continue lovastatin  for cholesterol management.

## 2024-03-10 ENCOUNTER — Other Ambulatory Visit: Payer: Self-pay | Admitting: Family Medicine

## 2024-03-10 ENCOUNTER — Other Ambulatory Visit: Payer: Self-pay

## 2024-03-10 ENCOUNTER — Ambulatory Visit: Payer: Self-pay | Admitting: Family Medicine

## 2024-03-10 DIAGNOSIS — M109 Gout, unspecified: Secondary | ICD-10-CM

## 2024-03-10 MED ORDER — COLCHICINE 0.6 MG PO TABS
0.6000 mg | ORAL_TABLET | Freq: Every day | ORAL | 0 refills | Status: DC
  Filled 2024-03-10: qty 30, 30d supply, fill #0

## 2024-03-10 MED ORDER — ALLOPURINOL 100 MG PO TABS
100.0000 mg | ORAL_TABLET | Freq: Two times a day (BID) | ORAL | 1 refills | Status: DC
Start: 2024-03-10 — End: 2024-06-15
  Filled 2024-03-10: qty 180, 90d supply, fill #0
  Filled 2024-05-19 – 2024-06-02 (×2): qty 180, 90d supply, fill #1

## 2024-03-12 ENCOUNTER — Other Ambulatory Visit: Payer: Self-pay | Admitting: Family Medicine

## 2024-03-12 ENCOUNTER — Other Ambulatory Visit: Payer: Self-pay

## 2024-03-12 DIAGNOSIS — E1169 Type 2 diabetes mellitus with other specified complication: Secondary | ICD-10-CM

## 2024-03-12 MED ORDER — LOVASTATIN 40 MG PO TABS
40.0000 mg | ORAL_TABLET | Freq: Every day | ORAL | 1 refills | Status: DC
  Filled 2024-03-12: qty 90, 90d supply, fill #0
  Filled 2024-05-19 – 2024-06-02 (×2): qty 90, 90d supply, fill #1

## 2024-05-19 ENCOUNTER — Other Ambulatory Visit: Payer: Self-pay

## 2024-05-19 ENCOUNTER — Other Ambulatory Visit: Payer: Self-pay | Admitting: Family Medicine

## 2024-05-19 DIAGNOSIS — K219 Gastro-esophageal reflux disease without esophagitis: Secondary | ICD-10-CM

## 2024-05-19 DIAGNOSIS — R809 Proteinuria, unspecified: Secondary | ICD-10-CM

## 2024-05-19 DIAGNOSIS — E785 Hyperlipidemia, unspecified: Secondary | ICD-10-CM

## 2024-05-19 DIAGNOSIS — M109 Gout, unspecified: Secondary | ICD-10-CM

## 2024-05-20 ENCOUNTER — Other Ambulatory Visit: Payer: Self-pay

## 2024-05-31 ENCOUNTER — Other Ambulatory Visit: Payer: Self-pay

## 2024-06-02 ENCOUNTER — Other Ambulatory Visit: Payer: Self-pay

## 2024-06-02 ENCOUNTER — Other Ambulatory Visit: Payer: Self-pay | Admitting: Family Medicine

## 2024-06-02 DIAGNOSIS — E1169 Type 2 diabetes mellitus with other specified complication: Secondary | ICD-10-CM

## 2024-06-02 DIAGNOSIS — K219 Gastro-esophageal reflux disease without esophagitis: Secondary | ICD-10-CM

## 2024-06-02 DIAGNOSIS — E1129 Type 2 diabetes mellitus with other diabetic kidney complication: Secondary | ICD-10-CM

## 2024-06-02 DIAGNOSIS — M109 Gout, unspecified: Secondary | ICD-10-CM

## 2024-06-02 MED ORDER — ESOMEPRAZOLE MAGNESIUM 40 MG PO CPDR
40.0000 mg | DELAYED_RELEASE_CAPSULE | Freq: Every morning | ORAL | 0 refills | Status: DC
Start: 2024-06-02 — End: 2024-06-15
  Filled 2024-06-02: qty 90, 90d supply, fill #0

## 2024-06-02 MED ORDER — PIOGLITAZONE HCL 15 MG PO TABS
15.0000 mg | ORAL_TABLET | Freq: Every day | ORAL | 0 refills | Status: DC
  Filled 2024-06-02: qty 90, 90d supply, fill #0

## 2024-06-02 MED ORDER — METFORMIN HCL ER 750 MG PO TB24
1500.0000 mg | ORAL_TABLET | Freq: Every day | ORAL | 0 refills | Status: DC
  Filled 2024-06-02: qty 180, 90d supply, fill #0

## 2024-06-02 MED ORDER — TRULICITY 4.5 MG/0.5ML ~~LOC~~ SOAJ
4.5000 mg | SUBCUTANEOUS | 0 refills | Status: DC
  Filled 2024-06-02: qty 2, 28d supply, fill #0

## 2024-06-02 MED ORDER — COLCHICINE 0.6 MG PO TABS
0.6000 mg | ORAL_TABLET | Freq: Every day | ORAL | 0 refills | Status: DC
  Filled 2024-06-02: qty 30, 15d supply, fill #0

## 2024-06-02 NOTE — Telephone Encounter (Signed)
 Pt did not answer the phone. I did leave a voice message. If he calls back where do we put him? Tomorrow is Dr Glenard half day and she works half day on Friday and Monday. Please advise.

## 2024-06-02 NOTE — Telephone Encounter (Signed)
 Lvm informing pt that refills have been sent and for him to call back to r/s his 06/15/24 appt

## 2024-06-02 NOTE — Telephone Encounter (Signed)
 Copied from CRM [id number]. Topic: General - Other >> Jun 02, 2024  3:53 PM Delon T wrote: Reason for CRM: Patient is not able to make it in tomorrow and then again on Sept 2, he is going out of town on Tuesday and needed to get his meds refilled- please call [phone]  leave a message, is at work.

## 2024-06-02 NOTE — Telephone Encounter (Signed)
 Patient will be out on 06/07/24 and will not see you till 06/15/24.

## 2024-06-15 ENCOUNTER — Other Ambulatory Visit: Payer: Self-pay

## 2024-06-15 ENCOUNTER — Telehealth: Payer: Self-pay | Admitting: Pharmacy Technician

## 2024-06-15 ENCOUNTER — Ambulatory Visit: Admitting: Family Medicine

## 2024-06-15 ENCOUNTER — Other Ambulatory Visit (HOSPITAL_COMMUNITY): Payer: Self-pay

## 2024-06-15 ENCOUNTER — Encounter: Payer: Self-pay | Admitting: Family Medicine

## 2024-06-15 VITALS — BP 124/80 | HR 87 | Resp 16 | Ht 68.0 in | Wt 249.1 lb

## 2024-06-15 DIAGNOSIS — E1169 Type 2 diabetes mellitus with other specified complication: Secondary | ICD-10-CM | POA: Diagnosis not present

## 2024-06-15 DIAGNOSIS — E1129 Type 2 diabetes mellitus with other diabetic kidney complication: Secondary | ICD-10-CM | POA: Diagnosis not present

## 2024-06-15 DIAGNOSIS — G4733 Obstructive sleep apnea (adult) (pediatric): Secondary | ICD-10-CM

## 2024-06-15 DIAGNOSIS — E785 Hyperlipidemia, unspecified: Secondary | ICD-10-CM

## 2024-06-15 DIAGNOSIS — Z87898 Personal history of other specified conditions: Secondary | ICD-10-CM

## 2024-06-15 DIAGNOSIS — M109 Gout, unspecified: Secondary | ICD-10-CM

## 2024-06-15 DIAGNOSIS — M159 Polyosteoarthritis, unspecified: Secondary | ICD-10-CM

## 2024-06-15 DIAGNOSIS — I152 Hypertension secondary to endocrine disorders: Secondary | ICD-10-CM

## 2024-06-15 DIAGNOSIS — B001 Herpesviral vesicular dermatitis: Secondary | ICD-10-CM

## 2024-06-15 DIAGNOSIS — R809 Proteinuria, unspecified: Secondary | ICD-10-CM

## 2024-06-15 DIAGNOSIS — K219 Gastro-esophageal reflux disease without esophagitis: Secondary | ICD-10-CM

## 2024-06-15 DIAGNOSIS — Z1159 Encounter for screening for other viral diseases: Secondary | ICD-10-CM

## 2024-06-15 DIAGNOSIS — E1159 Type 2 diabetes mellitus with other circulatory complications: Secondary | ICD-10-CM

## 2024-06-15 LAB — POCT GLYCOSYLATED HEMOGLOBIN (HGB A1C): Hemoglobin A1C: 8 % — AB (ref 4.0–5.6)

## 2024-06-15 MED ORDER — MELOXICAM 15 MG PO TABS
15.0000 mg | ORAL_TABLET | Freq: Every day | ORAL | 0 refills | Status: AC | PRN
  Filled 2024-06-15: qty 90, 90d supply, fill #0

## 2024-06-15 MED ORDER — METFORMIN HCL ER 750 MG PO TB24
1500.0000 mg | ORAL_TABLET | Freq: Every day | ORAL | 1 refills | Status: AC
Start: 2024-06-15 — End: ?
  Filled 2024-06-15 – 2024-09-01 (×3): qty 180, 90d supply, fill #0

## 2024-06-15 MED ORDER — VALACYCLOVIR HCL 1 G PO TABS
1000.0000 mg | ORAL_TABLET | Freq: Two times a day (BID) | ORAL | 0 refills | Status: AC
  Filled 2024-06-15: qty 20, 10d supply, fill #0

## 2024-06-15 MED ORDER — DEXCOM G7 SENSOR MISC
1.0000 | 5 refills | Status: DC
  Filled 2024-06-15: qty 1, 10d supply, fill #0

## 2024-06-15 MED ORDER — TRULICITY 4.5 MG/0.5ML ~~LOC~~ SOAJ
4.5000 mg | SUBCUTANEOUS | 1 refills | Status: DC
  Filled 2024-06-15 – 2024-06-28 (×2): qty 6, 84d supply, fill #0
  Filled 2024-09-01 – 2024-09-03 (×2): qty 6, 84d supply, fill #1

## 2024-06-15 MED ORDER — ESOMEPRAZOLE MAGNESIUM 40 MG PO CPDR
40.0000 mg | DELAYED_RELEASE_CAPSULE | Freq: Every morning | ORAL | 1 refills | Status: AC
  Filled 2024-06-15 – 2024-09-01 (×3): qty 90, 90d supply, fill #0

## 2024-06-15 MED ORDER — LOVASTATIN 40 MG PO TABS
40.0000 mg | ORAL_TABLET | Freq: Every day | ORAL | 1 refills | Status: AC
  Filled 2024-06-15 – 2024-09-01 (×3): qty 90, 90d supply, fill #0

## 2024-06-15 MED ORDER — SCOPOLAMINE 1 MG/3DAYS TD PT72
1.0000 | MEDICATED_PATCH | TRANSDERMAL | 0 refills | Status: DC
Start: 2024-06-15 — End: 2024-07-29
  Filled 2024-06-15: qty 4, 12d supply, fill #0

## 2024-06-15 MED ORDER — ALLOPURINOL 100 MG PO TABS
100.0000 mg | ORAL_TABLET | Freq: Every day | ORAL | 1 refills | Status: AC
  Filled 2024-06-15 – 2024-09-01 (×3): qty 90, 90d supply, fill #0

## 2024-06-15 MED ORDER — PIOGLITAZONE HCL 15 MG PO TABS
15.0000 mg | ORAL_TABLET | Freq: Every day | ORAL | 1 refills | Status: AC
  Filled 2024-06-15 – 2024-09-01 (×3): qty 90, 90d supply, fill #0

## 2024-06-15 MED ORDER — EMPAGLIFLOZIN 25 MG PO TABS
25.0000 mg | ORAL_TABLET | Freq: Every day | ORAL | 1 refills | Status: DC
  Filled 2024-06-15: qty 90, 90d supply, fill #0
  Filled 2024-08-08 – 2024-09-01 (×2): qty 90, 90d supply, fill #1

## 2024-06-15 NOTE — Progress Notes (Signed)
 Name: Trevor Dennis.   MRN: [id number]    DOB: 04-22-1971   Date:06/15/2024       Progress Note  Subjective  Chief Complaint  Chief Complaint  Patient presents with   Medical Management of Chronic Issues   Discussed the use of AI scribe software for clinical note transcription with the patient, who gave verbal consent to proceed.  History of Present Illness Trevor Dennis. is a 53 year old male with type 2 diabetes mellitus who presents for a regular follow-up visit.  His last A1c in May was 8.3%, and it has slightly improved to 8.0% since starting insulin  therapy. He uses Tresiba  at 5 units daily, Trulicity  4.5 mg once weekly, metformin  750 mg twice daily, and pioglitazone  15 mg . He has not made significant dietary changes. He has not used continuous glucose monitoring devices. No symptoms of uncontrolled diabetes such as excessive hunger or thirst, but he experiences sugar cravings at night, sometimes snacking on wheat bread and jelly. He mentions a recent weight gain, currently weighing 249 lbs, partly due to a recent event where he consumed a large amount of smoked meat.  He has  dyslipidemia for which he takes Mevacor . He denies side effects of medications   He has a history of gout, managed with allopurinol  100 mg daily, which is effective. He has had occasional flare-ups, managed with colchicine . Certain foods like pork and shrimp can trigger his gout.  He uses a Z Quiet mouthpiece for obstructive sleep apnea and seems to work for him   .He also takes Nexium  daily for gastroesophageal reflux disease.  He experiences chronic knee pain and right shoulder impingement syndrome, for which he uses meloxicam  as needed. He has tried an over-the-counter anti-inflammatory called Numeri, which he finds effective but is unsure of its ingredients.  He mentions a recent rash that does not itch due to lack of sensation from previous back surgery. He has some triamcinolone  at home but has  not used it yet, just noticed the rash yesterday and has been outdoors  He is planning a family cruise from September 19th to 28th and expresses concern about motion sickness, noting a history of severe motion sickness on a fishing trip.    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   Anxiety 01/01/2016   Elevated liver enzymes 07/27/2015   Intermittent low back pain 07/27/2015   Fatty liver 05/03/2015   Dyslipidemia associated with type 2 diabetes mellitus (HCC) 04/24/2015   HLD (hyperlipidemia) 04/24/2015   Fever blister 04/24/2015    Past Surgical History:  Procedure Laterality Date   BACK SURGERY  8 years ago    Family History  Problem Relation Age of Onset   Diabetes Mother    Cancer Father 47       lung cancer   Lung cancer Father    Liver disease Brother    Alcohol abuse Brother    ADD / ADHD Son    ADD / ADHD Son     Social History   Tobacco Use  Smoking status: Former    Current packs/day: 0.00    Types: Cigarettes    Quit date: 02/15/1990    Years since quitting: 34.3   Smokeless tobacco: Never  Substance Use Topics   Alcohol use: Yes    Alcohol/week: 0.0 standard drinks of alcohol    Comment: Occasional      Current Outpatient Medications:    allopurinol  (ZYLOPRIM ) 100 MG tablet, Take 1 tablet (100 mg total) by mouth 2 (two) times daily., Disp: 180 tablet, Rfl: 1   ALPRAZolam  (XANAX ) 0.5 MG tablet, Take 1 tablet (0.5 mg total) by mouth daily as needed., Disp: 30 tablet,  Rfl: 0   cetirizine  (ZYRTEC ) 10 MG tablet, Take 1 tablet (10 mg total) by mouth daily., Disp: 90 tablet, Rfl: 0   colchicine  0.6 MG tablet, Take 1 tablet (0.6 mg total) by mouth daily at onset of pain and may repeat once in 2 hours, only for gout attacks, Disp: 30 tablet, Rfl: 0   Dulaglutide  (TRULICITY ) 4.5 MG/0.5ML SOAJ, Inject 4.5 mg into the skin once a week., Disp: 6 mL, Rfl: 0   EPINEPHrine  0.3 mg/0.3 mL IJ SOAJ injection, Inject 0.3 mg into the muscle as needed for anaphylaxis., Disp: 2 each, Rfl: 0   esomeprazole  (NEXIUM ) 40 MG capsule, Take 1 capsule (40 mg total) by mouth in the morning., Disp: 90 capsule, Rfl: 0   Fluocinonide  0.1 % CREA, Apply 1 g topically 2 (two) times daily., Disp: 120 g, Rfl: 0   hydrOXYzine  (ATARAX ) 10 MG tablet, Take 1 tablet (10 mg total) by mouth at bedtime as needed., Disp: 30 tablet, Rfl: 0   insulin  degludec (TRESIBA  FLEXTOUCH) 100 UNIT/ML FlexTouch Pen, Inject 10-50 Units into the skin daily., Disp: 15 mL, Rfl: 0   Insulin  Pen Needle 32G X 6 MM MISC, 1 each by Does not apply route daily., Disp: 100 each, Rfl: 0   lovastatin  (MEVACOR ) 40 MG tablet, Take 1 tablet (40 mg total) by mouth at bedtime., Disp: 90 tablet, Rfl: 1   meloxicam  (MOBIC ) 15 MG tablet, Take 1 tablet (15 mg total) by mouth daily as needed for pain. Take tylenol instead when possible, Disp: 90 tablet, Rfl: 0   metFORMIN  (GLUCOPHAGE -XR) 750 MG 24 hr tablet, Take 2 tablets (1,500 mg total) by mouth daily with breakfast., Disp: 180 tablet, Rfl: 0   Multiple Vitamin (MULTI VITAMIN DAILY PO), Take by mouth daily. , Disp: , Rfl:    pioglitazone  (ACTOS ) 15 MG tablet, Take 1 tablet (15 mg total) by mouth daily., Disp: 90 tablet, Rfl: 0   valACYclovir  (VALTREX ) 1000 MG tablet, Take 1 tablet (1,000 mg total) by mouth 2 (two) times daily., Disp: 20 tablet, Rfl: 0  No Known Allergies  I personally reviewed active problem list, medication list, allergies, family history with the patient/caregiver  today.   ROS  Ten systems reviewed and is negative except as mentioned in HPI    Objective Physical Exam MEASUREMENTS: Weight- 249. CONSTITUTIONAL: Patient appears well-developed and well-nourished. No distress. HEENT: Head atraumatic, normocephalic, neck supple. CARDIOVASCULAR: Normal rate, regular rhythm and normal heart sounds. No murmur heard. No BLE edema. PULMONARY: Effort normal and breath sounds normal. Lungs clear to auscultation. No respiratory distress. ABDOMINAL: There is no tenderness or distention. MUSCULOSKELETAL: Normal gait. Without gross motor or sensory deficit. PSYCHIATRIC: Patient has a normal mood and affect. Behavior is normal. Judgment and thought content normal. SKIN: Linear vesicular rash, possible poison ivy.     Vitals:   06/15/24 9160  BP: 124/80  Pulse: 87  Resp: 16  SpO2: 98%  Weight: 249 lb 1.6 oz (113 kg)  Height: 5' 8 (1.727 m)    Body mass index is 37.88 kg/m.  Recent Results (from the past 2160 hours)  POCT glycosylated hemoglobin (Hb A1C)     Status: Abnormal   Collection Time: 06/15/24  8:44 AM  Result Value Ref Range   Hemoglobin A1C 8.0 (A) 4.0 - 5.6 %   HbA1c POC (<> result, manual entry)     HbA1c, POC (prediabetic range)     HbA1c, POC (controlled diabetic range)      Diabetic Foot Exam:     PHQ2/9:    06/15/2024    8:33 AM 01/06/2024   11:22 AM 12/09/2023    8:55 AM 11/20/2023   10:27 AM 09/04/2023    8:32 AM  Depression screen PHQ 2/9  Decreased Interest 0 0 0 0 0  Down, Depressed, Hopeless 0 0 0 0 0  PHQ - 2 Score 0 0 0 0 0  Altered sleeping  0 0 0 0  Tired, decreased energy  0 0 0 0  Change in appetite  0 0 0 0  Feeling bad or failure about yourself   0 0 0 0  Trouble concentrating  0 0 0 0  Moving slowly or fidgety/restless  0 0 0 0  Suicidal thoughts  0 0 0 0  PHQ-9 Score  0 0 0 0  Difficult doing work/chores  Not difficult at all Not difficult at all Not difficult at all     phq 9 is negative  Fall  Risk:    06/15/2024    8:33 AM 11/20/2023   10:27 AM 09/04/2023    8:32 AM 07/28/2023    8:47 AM 06/04/2023    8:48 AM  Fall Risk   Falls in the past year? 0 0 0 0 0  Number falls in past yr: 0 0     Injury with Fall? 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 Dyslipidemia and obesity A1c improved to 8.0%. Microalbuminuria indicates kidney involvement. Jardiance  discussed for diabetes and kidney protection. - Increase Tresiba  to 8 units daily. - Prescribe Jardiance  25 mg daily . - Advise dietary modifications: reduce nighttime carbs, add protein-rich snacks. - Discuss potential use of Dexcom 7 if covered by insurance. - he used to take ACe but stopped due to drop in BP  Morbid obesity due to excess calories - BMI over 35 with co-morbidities  Weight at 249 lbs with recent gain. Weight loss benefits of Trulicity  and Jardiance  discussed. - Encourage dietary changes. - Discuss weight loss benefits of Trulicity  and Jardiance .  Obstructive sleep apnea Managed with Z Quiet mouthpiece.  Gout Controlled with allopurinol . Occasional colchicine  for flare-ups. Dietary triggers discussed. - Continue allopurinol  daily. - Use colchicine  as needed for flare-ups. - Advise on dietary modifications to avoid triggers.  Gastroesophageal reflux disease Managed with daily Nexium . - Continue Nexium  daily.  Polyosteoarthritis with chronic right knee pain, right shoulder impingement, and cervicalgia Chronic pain in multiple joints. Uses meloxicam  as needed. Discussed Numeri supplement caution. - Continue meloxicam  as needed. -  Send picture of otc supplements ingredients for safety check. - Caution against concurrent NSAID use.  Dyslipidemia Managed with Mevacor . No refills left. - Send refill for Mevacor .  Contact dermatitis, lower extremity (suspected poison ivy) Blistering and linear vesicles present. No itching due to lack of sensation. - Use triamcinolone  topical steroid. - Advise on hygiene and avoidance of scratching. - Monitor for spreading or worsening symptoms.  General Health Maintenance Discussed importance of dietary changes and weight management. - Advise on dietary changes for diabetes and weight management.  Follow-Up Follow-up plan for diabetes and health monitoring discussed. - Schedule follow-up in three months for A1c and health reassessment. - Advise on scopolamine  patch for motion sickness if needed. - Prescribe Valtrex  and Colace as requested.

## 2024-06-15 NOTE — Telephone Encounter (Signed)
 Pharmacy Patient Advocate Encounter   Received notification from CoverMyMeds that prior authorization for Dexcom G7 Sensor  is required/requested.   Insurance verification completed.   The patient is insured through [employer] .   Per test claim: PA required; PA submitted to above mentioned insurance via Latent Key/confirmation #/EOC [id number] Status is pending

## 2024-06-16 ENCOUNTER — Other Ambulatory Visit: Payer: Self-pay

## 2024-06-16 ENCOUNTER — Encounter: Payer: Self-pay | Admitting: Family Medicine

## 2024-06-17 ENCOUNTER — Other Ambulatory Visit (HOSPITAL_COMMUNITY): Payer: Self-pay

## 2024-06-18 ENCOUNTER — Other Ambulatory Visit (HOSPITAL_COMMUNITY): Payer: Self-pay

## 2024-06-18 NOTE — Telephone Encounter (Signed)
 Pharmacy Patient Advocate Encounter  Received notification from [facility] that Prior Authorization for Dexcom G7 Sensor  has been DENIED.  Full denial letter will be uploaded to the media tab. See denial reason below.     PA #/Case ID/Reference #: [id number]

## 2024-06-28 ENCOUNTER — Other Ambulatory Visit: Payer: Self-pay

## 2024-07-01 ENCOUNTER — Ambulatory Visit: Admitting: Family Medicine

## 2024-07-01 ENCOUNTER — Other Ambulatory Visit (HOSPITAL_COMMUNITY): Payer: Self-pay

## 2024-07-01 ENCOUNTER — Telehealth: Payer: Self-pay | Admitting: Pharmacy Technician

## 2024-07-01 ENCOUNTER — Other Ambulatory Visit: Payer: Self-pay

## 2024-07-01 ENCOUNTER — Encounter: Payer: Self-pay | Admitting: Family Medicine

## 2024-07-01 VITALS — BP 128/78 | HR 80 | Resp 16 | Ht 68.0 in | Wt 245.4 lb

## 2024-07-01 DIAGNOSIS — E1129 Type 2 diabetes mellitus with other diabetic kidney complication: Secondary | ICD-10-CM

## 2024-07-01 DIAGNOSIS — R809 Proteinuria, unspecified: Secondary | ICD-10-CM

## 2024-07-01 DIAGNOSIS — R058 Other specified cough: Secondary | ICD-10-CM

## 2024-07-01 MED ORDER — BENZONATATE 100 MG PO CAPS
100.0000 mg | ORAL_CAPSULE | Freq: Four times a day (QID) | ORAL | 0 refills | Status: DC
  Filled 2024-07-01: qty 40, 7d supply, fill #0

## 2024-07-01 MED ORDER — AZITHROMYCIN 250 MG PO TABS
ORAL_TABLET | ORAL | 0 refills | Status: AC
  Filled 2024-07-01: qty 6, 5d supply, fill #0

## 2024-07-01 MED ORDER — AIRSUPRA 90-80 MCG/ACT IN AERO
2.0000 | INHALATION_SPRAY | Freq: Four times a day (QID) | RESPIRATORY_TRACT | 0 refills | Status: DC | PRN
  Filled 2024-07-01: qty 10.7, 30d supply, fill #0

## 2024-07-01 MED ORDER — HYDROCOD POLI-CHLORPHE POLI ER 10-8 MG/5ML PO SUER
5.0000 mL | Freq: Every evening | ORAL | 0 refills | Status: DC | PRN
  Filled 2024-07-01: qty 25, 5d supply, fill #0

## 2024-07-01 NOTE — Progress Notes (Signed)
 Name: Trevor Dennis.   MRN: [id number]    DOB: [birth year]   Date:07/01/2024       Progress Note  Subjective  Chief Complaint  Chief Complaint  Patient presents with   Cough    Lingering cough, ongoing for a week and half, took 3 COVID Tests all NEGATIVE    Discussed the use of AI scribe software for clinical note transcription with the patient, who gave verbal consent to proceed.  History of Present Illness Trevor Dennis. is a 53 year old male with diabetes who presents with a persistent cough and respiratory symptoms.  He is on day nine or ten of an illness that began after his son returned home from school sick, leading to the entire family becoming ill. He is the last to recover.  Initially, he experienced body aches, fever, and diarrhea, but no vomiting or cough. The fever was difficult to break and lasted for two days, during which he took DayQuil and NyQuil, which helped alleviate the fever, leaving only body aches for another two days.  Subsequently, he developed a cough with a burning sensation in his chest, nasal congestion, and sinus issues, requiring him to blow his nose about once an hour. Physical activity exacerbates the cough, making it deep and productive, with phlegm production, especially in the mornings. He has taken three COVID tests throughout this period, all of which were negative. He resumed taking Zyrtec , suspecting allergies due to recent outdoor activities like sweeping and raking leaves, but it has not provided relief. He is concerned about his upcoming cruise and the potential impact of his cough on other guests.  He has diabetes and is currently taking insulin . He recently started Jardiance  but reduced the frequency to every other day due to excessive urination, which he describes as occurring every forty minutes. He plans to resume daily dosing during his upcoming vacation to help his body adjust.  No vomiting.    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   Anxiety 01/01/2016   Elevated liver enzymes 07/27/2015   Intermittent low back pain 07/27/2015   Fatty liver 05/03/2015   Dyslipidemia associated with type 2 diabetes mellitus (HCC) 04/24/2015   HLD (hyperlipidemia) 04/24/2015   Fever blister 04/24/2015    Social History   Tobacco Use   Smoking status: Former    Current packs/day: 0.00    Types: Cigarettes    Quit date: 02/15/1990    Years since quitting: 34.3   Smokeless tobacco: Never  Substance Use Topics   Alcohol use: Yes    Alcohol/week: 0.0 standard drinks of alcohol    Comment: Occasional      Current Outpatient Medications:    allopurinol  (ZYLOPRIM ) 100 MG tablet, Take 1 tablet (100 mg total) by mouth daily., Disp: 90 tablet, Rfl: 1   ALPRAZolam  (XANAX ) 0.5 MG tablet, Take 1 tablet (0.5 mg total) by mouth daily as needed., Disp: 30 tablet, Rfl: 0   cetirizine  (ZYRTEC ) 10 MG tablet, Take 1 tablet (10 mg total) by mouth daily., Disp: 90 tablet, Rfl:  0   colchicine  0.6 MG tablet, Take 1 tablet (0.6 mg total) by mouth daily at onset of pain and may repeat once in 2 hours, only for gout attacks, Disp: 30 tablet, Rfl: 0   Continuous Glucose Sensor (DEXCOM G7 SENSOR) MISC, 1 each by Does not apply route as directed., Disp: 1 each, Rfl: 5   Dulaglutide  (TRULICITY ) 4.5 MG/0.5ML SOAJ, Inject 4.5 mg into the skin once a week., Disp: 6 mL, Rfl: 1   empagliflozin  (JARDIANCE ) 25 MG TABS tablet, Take 1 tablet (25 mg total) by mouth daily before  breakfast., Disp: 90 tablet, Rfl: 1   EPINEPHrine  0.3 mg/0.3 mL IJ SOAJ injection, Inject 0.3 mg into the muscle as needed for anaphylaxis., Disp: 2 each, Rfl: 0   esomeprazole  (NEXIUM ) 40 MG capsule, Take 1 capsule (40 mg total) by mouth in the morning., Disp: 90 capsule, Rfl: 1   Fluocinonide  0.1 % CREA, Apply 1 g topically 2 (two) times daily., Disp: 120 g, Rfl: 0   hydrOXYzine  (ATARAX ) 10 MG tablet, Take 1 tablet (10 mg total) by mouth at bedtime as needed., Disp: 30 tablet, Rfl: 0   insulin  degludec (TRESIBA  FLEXTOUCH) 100 UNIT/ML FlexTouch Pen, Inject 10-50 Units into the skin daily., Disp: 15 mL, Rfl: 0   Insulin  Pen Needle 32G X 6 MM MISC, 1 each by Does not apply route daily., Disp: 100 each, Rfl: 0   lovastatin  (MEVACOR ) 40 MG tablet, Take 1 tablet (40 mg total) by mouth at bedtime., Disp: 90 tablet, Rfl: 1   meloxicam  (MOBIC ) 15 MG tablet, Take 1 tablet (15 mg total) by mouth daily as needed for pain. Take tylenol instead when possible, Disp: 90 tablet, Rfl: 0   metFORMIN  (GLUCOPHAGE -XR) 750 MG 24 hr tablet, Take 2 tablets (1,500 mg total) by mouth daily with breakfast., Disp: 180 tablet, Rfl: 1   Multiple Vitamin (MULTI VITAMIN DAILY PO), Take by mouth daily. , Disp: , Rfl:    pioglitazone  (ACTOS ) 15 MG tablet, Take 1 tablet (15 mg total) by mouth daily., Disp: 90 tablet, Rfl: 1   scopolamine  (TRANSDERM-SCOP) 1 MG/3DAYS, Place 1 patch (1 mg total) onto the skin every 3 (three) days., Disp: 4 patch, Rfl: 0   valACYclovir  (VALTREX ) 1000 MG tablet, Take 1 tablet (1,000 mg total) by mouth 2 (two) times daily., Disp: 20 tablet, Rfl: 0  No Known Allergies  ROS  Ten systems reviewed and is negative except as mentioned in HPI    Objective  Vitals:   07/01/24 1128  BP: 128/78  Pulse: 80  Resp: 16  SpO2: 98%  Weight: 245 lb 6.4 oz (111.3 kg)  Height: 5' 8 (1.727 m)    Body mass index is 37.31 kg/m.    Physical Exam CONSTITUTIONAL: Patient appears well-developed and  well-nourished. No distress. HEENT: Head atraumatic, normocephalic, neck supple. CARDIOVASCULAR: Normal rate, regular rhythm and normal heart sounds. No murmur heard. No BLE edema. PULMONARY: Effort normal and breath sounds normal. Lungs normal. No respiratory distress. ABDOMINAL: There is no tenderness or distention. MUSCULOSKELETAL: Normal gait. Without gross motor or sensory deficit. PSYCHIATRIC: Patient has a normal mood and affect. Behavior is normal. Judgment and thought content normal.  Recent Results (from the past 2160 hours)  POCT glycosylated hemoglobin (Hb A1C)     Status: Abnormal   Collection Time: 06/15/24  8:44 AM  Result Value Ref Range   Hemoglobin A1C 8.0 (A) 4.0 - 5.6 %   HbA1c POC (<> result, manual entry)  HbA1c, POC (prediabetic range)     HbA1c, POC (controlled diabetic range)      Assessment & Plan Acute bronchitis with productive cough Post-viral bronchitis with persistent cough. Negative COVID-19 tests. No bacterial infection evidence, but treatment initiated due to upcoming cruise. - Prescribed inhaler with corticosteroid, two puffs up to four times daily, rinse mouth after use. - Prescribed Z-Pak (azithromycin ) for potential bacterial infection and anti-inflammatory benefits. - Prescribed Tessalon  Perles, one to two capsules four times daily for daytime cough relief. - Prescribed Tussionex syrup, one teaspoon as needed for nighttime cough relief. - Provided note for cruise clearance, no quarantine needed.  Type 2 diabetes mellitus with microalbuminuria  Managed with insulin  and Jardiance . Increased urination with Jardiance , advised to resume daily dosing during vacation. Emphasized dietary modifications to reduce sugar intake. - Advised resuming daily Jardiance  during vacation for body adjustment and kidney protection. - Encouraged dietary modifications to reduce sugar intake.

## 2024-07-01 NOTE — Telephone Encounter (Signed)
 Pharmacy Patient Advocate Encounter   Received notification from [employer]base that prior authorization for Airsupra  90-80MCG/ACT aerosol is required/requested.   Insurance verification completed.   The patient is insured through [facility] .   Per test claim:  SYMBICORT  or DULERA or ADVAIR is preferred by the insurance.  If suggested medication is appropriate, Please send in a new RX and discontinue this one. If not, please advise as to why it's not appropriate so that we may request a Prior Authorization. Please note, some preferred medications may still require a PA.  If the suggested medications have not been trialed and there are no contraindications to their use, the PA will not be submitted, as it will not be approved.   Airsupra  90-80MCG/ACT aerosol is plan/benefit exclusion, product not on formulary

## 2024-07-28 NOTE — Patient Instructions (Signed)

## 2024-07-29 ENCOUNTER — Encounter: Payer: Self-pay | Admitting: Family Medicine

## 2024-07-29 ENCOUNTER — Ambulatory Visit: Payer: Self-pay | Admitting: Family Medicine

## 2024-07-29 VITALS — BP 124/76 | HR 78 | Resp 16 | Ht 68.0 in | Wt 248.6 lb

## 2024-07-29 DIAGNOSIS — E1169 Type 2 diabetes mellitus with other specified complication: Secondary | ICD-10-CM

## 2024-07-29 DIAGNOSIS — Z7984 Long term (current) use of oral hypoglycemic drugs: Secondary | ICD-10-CM

## 2024-07-29 DIAGNOSIS — Z23 Encounter for immunization: Secondary | ICD-10-CM

## 2024-07-29 DIAGNOSIS — Z0000 Encounter for general adult medical examination without abnormal findings: Secondary | ICD-10-CM

## 2024-07-29 DIAGNOSIS — Z0001 Encounter for general adult medical examination with abnormal findings: Secondary | ICD-10-CM | POA: Diagnosis not present

## 2024-07-29 DIAGNOSIS — S93601A Unspecified sprain of right foot, initial encounter: Secondary | ICD-10-CM

## 2024-07-29 DIAGNOSIS — E785 Hyperlipidemia, unspecified: Secondary | ICD-10-CM

## 2024-07-29 NOTE — Progress Notes (Signed)
 Name: Trevor Dennis.   MRN: [id number]    DOB: [birth year]   Date:07/29/2024       Progress Note  Subjective  Chief Complaint  Chief Complaint  Patient presents with   Annual Exam    HPI  Patient presents for annual CPE  and discuss recent fall   Discussed the use of AI scribe software for clinical note transcription with the patient, who gave verbal consent to proceed.  History of Present Illness Trevor Jedlicka. is a 53 year old male who presents for an annual physical exam and evaluation of a right foot injury.  He sustained a right foot injury last night while raking leaves in his yard. He was startled by a rooster, tripped over a rock, and rolled his ankle. The injury involved a 'step in, roll out' motion, with a stretch and tear sensation on the top of his foot, which instantly became hot. He did not fall but experienced a strain with a 'pop' sound. The pain is described as a sharp, burning sensation with a pain level of seven out of ten. He has not used ice yet but has applied Dexycu  and has meloxicam  at home, which he uses as needed. He has a history of a similar injury from playing basketball, for which he has a brace.  He has diabetes with a last A1c of 8% in September. He is currently on Trulicity , which is covered by his insurance, but he has not tried Mounjaro  with his new insurance, he has been trying to eat healthier but has difficulty being consistent .  He quit smoking on July 4th of the previous year and has significantly reduced his gum chewing habit. He is physically active, engaging in yard work and playing disc golf. He has reduced his carbohydrate intake but struggles with food discipline.  He has a family history of cancer, with his father having lung cancer and his uncle recently diagnosed with pancreatic cancer. He has not had an eye exam recently but maintains regular dental check-ups.       IPSS     Row Name 07/29/24 9161         International  Prostate Symptom Score   How often have you had the sensation of not emptying your bladder? Not at All     How often have you had to urinate less than every two hours? Less than half the time     How often have you found you stopped and started again several times when you urinated? Not at All     How often have you found it difficult to postpone urination? Less than half the time     How often have you had a weak urinary stream? Not at All     How often have you had to strain to start urination? Less than half the time     How many times did you typically get up at night to urinate? 1 Time     Total IPSS Score 7       Quality of Life due to urinary symptoms   If you were to spend the rest of your life with your urinary condition just the way it is now how would you feel about that? Mostly Satisfied        Diet: cutting down on carbs, last A1C was 8 % Exercise: very active, playing disc gold, works in his yard.  Last Dental Exam: up to date  Last Eye Exam:  needs to have it done   Depression: phq 9 is negative    07/29/2024    8:31 AM 07/01/2024   11:19 AM 06/15/2024    8:33 AM 01/06/2024   11:22 AM 12/09/2023    8:55 AM  Depression screen PHQ 2/9  Decreased Interest 0 0 0 0 0  Down, Depressed, Hopeless 0 0 0 0 0  PHQ - 2 Score 0 0 0 0 0  Altered sleeping    0 0  Tired, decreased energy    0 0  Change in appetite    0 0  Feeling bad or failure about yourself     0 0  Trouble concentrating    0 0  Moving slowly or fidgety/restless    0 0  Suicidal thoughts    0 0  PHQ-9 Score    0 0  Difficult doing work/chores    Not difficult at all Not difficult at all    Hypertension:  BP Readings from Last 3 Encounters:  07/29/24 124/76  07/01/24 128/78  06/15/24 124/80    Obesity: Wt Readings from Last 3 Encounters:  07/29/24 248 lb 9.6 oz (112.8 kg)  07/01/24 245 lb 6.4 oz (111.3 kg)  06/15/24 249 lb 1.6 oz (113 kg)   BMI Readings from Last 3 Encounters:  07/29/24 37.80 kg/m   07/01/24 37.31 kg/m  06/15/24 37.88 kg/m     Flowsheet Row Office Visit from 07/29/2024 [facility]  AUDIT-C Score 1    Single STD testing and prevention (HIV/chl/gon/syphilis):  not applicable Sexual history: one partner  Hep C Screening: completed Skin cancer: Discussed monitoring for atypical lesions Colorectal cancer: repeat cologuard 2026 Prostate cancer:  not applicable Lab Results  Component Value Date   PSA 0.5 01/23/2017     Lung cancer:  Low Dose CT Chest recommended if Age 57-80 years, 30 pack-year currently smoking OR have quit w/in 15years. Patient  is not a candidate for screening   AAA: The USPSTF recommends one-time screening with ultrasonography in men ages 83 to 75 years who have ever smoked. Patient   is not a candidate for screening based on his age  ECG:  2024  Vaccines: reviewed with the patient.   Advanced Care Planning: A voluntary discussion about advance care planning including the explanation and discussion of advance directives.  Discussed health care proxy and Living will, and the patient was able to identify a health care proxy as his son - Trevor Dennis .  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   Anxiety 01/01/2016   Elevated liver enzymes 07/27/2015   Intermittent low back pain 07/27/2015   Fatty liver 05/03/2015   Dyslipidemia associated with type 2 diabetes mellitus  (HCC) 04/24/2015   HLD (hyperlipidemia) 04/24/2015   Fever blister 04/24/2015    Past Surgical History:  Procedure Laterality Date   BACK SURGERY  8 years ago    Family History  Problem Relation Age of Onset  Diabetes Mother    Cancer Father 37       lung cancer   Lung cancer Father    Liver disease Brother    Alcohol abuse Brother    ADD / ADHD Son    ADD / ADHD Son     Social History   Socioeconomic History   Marital status: Single    Spouse name: Not on file   Number of children: 2   Years of education: Not on file   Highest education level: Associate degree: occupational, Scientist, product/process development, or vocational program  Occupational History   Occupation: Owner    Comment: co-owner   Tobacco Use   Smoking status: Former    Current packs/day: 0.00    Types: Cigarettes    Quit date: 02/15/1990    Years since quitting: 34.4   Smokeless tobacco: Never  Vaping Use   Vaping status: Former  Substance and Sexual Activity   Alcohol use: Yes    Alcohol/week: 0.0 standard drinks of alcohol    Comment: Occasional    Drug use: No   Sexual activity: Yes    Partners: Female    Birth control/protection: Surgical  Other Topics Concern   Not on file  Social History Narrative   Divorced, ex wife used to cheat on him and also was an alcoholic    He has joint custody of his younger son. Her drinking is under better control   He has a grown son   Social Drivers of Corporate investment banker Strain: Low Risk  (07/29/2024)   Overall Financial Resource Strain (CARDIA)    Difficulty of Paying Living Expenses: Not hard at all  Food Insecurity: No Food Insecurity (07/29/2024)   Hunger Vital Sign    Worried About Running Out of Food in the Last Year: Never true    Ran Out of Food in the Last Year: Never true  Transportation Needs: No Transportation Needs (07/29/2024)   PRAPARE - Administrator, Civil Service (Medical): No    Lack of Transportation (Non-Medical): No  Physical  Activity: Insufficiently Active (07/29/2024)   Exercise Vital Sign    Days of Exercise per Week: 4 days    Minutes of Exercise per Session: 30 min  Stress: No Stress Concern Present (07/29/2024)   [employer] of Occupational Health - Occupational Stress Questionnaire    Feeling of Stress: Not at all  Social Connections: Moderately Isolated (07/29/2024)   Social Connection and Isolation Panel    Frequency of Communication with Friends and Family: More than three times a week    Frequency of Social Gatherings with Friends and Family: More than three times a week    Attends Religious Services: More than 4 times per year    Active Member of [employer] or Organizations: No    Attends Banker Meetings: Never    Marital Status: Divorced  Catering manager Violence: Not At Risk (07/29/2024)   Humiliation, Afraid, Rape, and Kick questionnaire    Fear of Current or Ex-Partner: No    Emotionally Abused: No    Physically Abused: No    Sexually Abused: No     Current Outpatient Medications:    Albuterol -Budesonide  (AIRSUPRA ) 90-80 MCG/ACT AERO, Inhale 2 puffs into the lungs 4 (four) times daily as needed., Disp: 10.7 g, Rfl: 0   allopurinol  (ZYLOPRIM ) 100 MG tablet, Take 1 tablet (100 mg total) by mouth daily., Disp: 90 tablet, Rfl: 1   ALPRAZolam  (XANAX ) 0.5 MG tablet, Take  1 tablet (0.5 mg total) by mouth daily as needed., Disp: 30 tablet, Rfl: 0   cetirizine  (ZYRTEC ) 10 MG tablet, Take 1 tablet (10 mg total) by mouth daily., Disp: 90 tablet, Rfl: 0   colchicine  0.6 MG tablet, Take 1 tablet (0.6 mg total) by mouth daily at onset of pain and may repeat once in 2 hours, only for gout attacks, Disp: 30 tablet, Rfl: 0   Continuous Glucose Sensor (DEXCOM G7 SENSOR) MISC, 1 each by Does not apply route as directed., Disp: 1 each, Rfl: 5   Dulaglutide  (TRULICITY ) 4.5 MG/0.5ML SOAJ, Inject 4.5 mg into the skin once a week., Disp: 6 mL, Rfl: 1   empagliflozin  (JARDIANCE ) 25 MG TABS tablet,  Take 1 tablet (25 mg total) by mouth daily before breakfast., Disp: 90 tablet, Rfl: 1   EPINEPHrine  0.3 mg/0.3 mL IJ SOAJ injection, Inject 0.3 mg into the muscle as needed for anaphylaxis., Disp: 2 each, Rfl: 0   esomeprazole  (NEXIUM ) 40 MG capsule, Take 1 capsule (40 mg total) by mouth in the morning., Disp: 90 capsule, Rfl: 1   Fluocinonide  0.1 % CREA, Apply 1 g topically 2 (two) times daily., Disp: 120 g, Rfl: 0   hydrOXYzine  (ATARAX ) 10 MG tablet, Take 1 tablet (10 mg total) by mouth at bedtime as needed., Disp: 30 tablet, Rfl: 0   insulin  degludec (TRESIBA  FLEXTOUCH) 100 UNIT/ML FlexTouch Pen, Inject 10-50 Units into the skin daily., Disp: 15 mL, Rfl: 0   Insulin  Pen Needle 32G X 6 MM MISC, 1 each by Does not apply route daily., Disp: 100 each, Rfl: 0   lovastatin  (MEVACOR ) 40 MG tablet, Take 1 tablet (40 mg total) by mouth at bedtime., Disp: 90 tablet, Rfl: 1   meloxicam  (MOBIC ) 15 MG tablet, Take 1 tablet (15 mg total) by mouth daily as needed for pain. Take tylenol instead when possible, Disp: 90 tablet, Rfl: 0   metFORMIN  (GLUCOPHAGE -XR) 750 MG 24 hr tablet, Take 2 tablets (1,500 mg total) by mouth daily with breakfast., Disp: 180 tablet, Rfl: 1   Multiple Vitamin (MULTI VITAMIN DAILY PO), Take by mouth daily. , Disp: , Rfl:    pioglitazone  (ACTOS ) 15 MG tablet, Take 1 tablet (15 mg total) by mouth daily., Disp: 90 tablet, Rfl: 1   scopolamine  (TRANSDERM-SCOP) 1 MG/3DAYS, Place 1 patch (1 mg total) onto the skin every 3 (three) days., Disp: 4 patch, Rfl: 0   valACYclovir  (VALTREX ) 1000 MG tablet, Take 1 tablet (1,000 mg total) by mouth 2 (two) times daily., Disp: 20 tablet, Rfl: 0  No Known Allergies   ROS  Constitutional: Negative for fever or weight change.  Respiratory: Negative for cough and shortness of breath.   Cardiovascular: Negative for chest pain or palpitations.  Gastrointestinal: Negative for abdominal pain, no bowel changes.  Musculoskeletal: Positive  for gait problem  no  joint swelling, swelling of medial right foot and also top of foot .  Skin: Negative for rash.  Neurological: Negative for dizziness or headache.  No other specific complaints in a complete review of systems (except as listed in HPI above).    Objective  Vitals:   07/29/24 0841  BP: 124/76  Pulse: 78  Resp: 16  SpO2: 99%  Weight: 248 lb 9.6 oz (112.8 kg)  Height: 5' 8 (1.727 m)    Body mass index is 37.8 kg/m.  Physical Exam  Constitutional: Patient appears well-developed and well-nourished. No distress.  HENT: Head: Normocephalic and atraumatic. Ears: B TMs ok, no  erythema or effusion; Nose: Nose normal. Mouth/Throat: Oropharynx is clear and moist. No oropharyngeal exudate.  Eyes: Conjunctivae and EOM are normal. Pupils are equal, round, and reactive to light. No scleral icterus.  Neck: Normal range of motion. Neck supple. No JVD present. No thyromegaly present.  Cardiovascular: Normal rate, regular rhythm and normal heart sounds.  No murmur heard. No BLE edema. Pulmonary/Chest: Effort normal and breath sounds normal. No respiratory distress. Abdominal: Soft. Bowel sounds are normal, no distension. There is no tenderness. no masses MALE GENITALIA: Normal descended testes bilaterally, no masses palpated, no hernias, no lesions, no discharge RECTAL:not done  Musculoskeletal: pain during palpation of top to medial aspect of right foot, some swelling noticed, no ecchymosis or redness, decrease rom of due to pain, but able to move toes and dorsiflex and plantar flex foot, ankles seems normal  Neurological: he is alert and oriented to person, place, and time. No cranial nerve deficit. Coordination, balance, strength, speech and gait are normal.  Skin: Skin is warm and dry. No rash noted. No erythema.  Psychiatric: Patient has a normal mood and affect. behavior is normal. Judgment and thought content normal.      Assessment & Plan Adult Wellness Visit Routine wellness visit.  Family history of cancer. Active lifestyle. High carbohydrate diet. Poor glycemic control with A1c at 8%. Immunizations not up to date. - Administer pneumococcal vaccine. - Encourage flu vaccination. - Advise on reducing carbohydrate intake to improve glycemic control. - Recommend annual eye exam due to diabetes. - Discussed importance of living will and trust for estate planning.  Right foot medial tendon strain Acute strain with pain rated 7/10. Differential includes tendon strain versus fracture. Emergency orthopedic care recommended for cost-effective x-ray and treatment. - Advise use of meloxicam  for 5 days to reduce inflammation. - Recommend icing and elevation of the foot. - Suggest using a brace or  ortho boot  - Instruct to seek care at [facility] for x-ray if symptoms worsen.  Type 2 diabetes mellitus with associated dyslipidemia and obesity  Poor glycemic control with A1c at 8%. Current medication is Trulicity . Discussed potential switch to Mounjaro  pending insurance coverage. - Encourage contacting insurance to check coverage for Mounjaro  or other GLP-1 receptor agonists. - Advise on dietary modifications to reduce carbohydrate intake.       -Prostate cancer screening and PSA options (with potential risks and benefits of testing vs not testing) were discussed along with recent recs/guidelines. -USPSTF grade A and B recommendations reviewed with patient; age-appropriate recommendations, preventive care, screening tests, etc discussed and encouraged; healthy living encouraged; see AVS for patient education given to patient -Discussed importance of 150 minutes of physical activity weekly, eat two servings of fish weekly, eat one serving of tree nuts ( cashews, pistachios, pecans, almonds.SABRA) every other day, eat 6 servings of fruit/vegetables daily and drink plenty of water and avoid sweet beverages.  -Reviewed Health Maintenance: yes

## 2024-08-03 ENCOUNTER — Other Ambulatory Visit: Payer: Self-pay | Admitting: Emergency Medicine

## 2024-08-03 DIAGNOSIS — R809 Proteinuria, unspecified: Secondary | ICD-10-CM

## 2024-08-03 DIAGNOSIS — E119 Type 2 diabetes mellitus without complications: Secondary | ICD-10-CM

## 2024-08-08 ENCOUNTER — Other Ambulatory Visit: Payer: Self-pay

## 2024-08-09 ENCOUNTER — Other Ambulatory Visit: Payer: Self-pay

## 2024-08-13 ENCOUNTER — Other Ambulatory Visit: Payer: Self-pay

## 2024-09-01 ENCOUNTER — Other Ambulatory Visit: Payer: Self-pay

## 2024-09-01 ENCOUNTER — Other Ambulatory Visit: Payer: Self-pay | Admitting: Family Medicine

## 2024-09-01 DIAGNOSIS — R21 Rash and other nonspecific skin eruption: Secondary | ICD-10-CM

## 2024-09-01 DIAGNOSIS — M109 Gout, unspecified: Secondary | ICD-10-CM

## 2024-09-02 ENCOUNTER — Other Ambulatory Visit: Payer: Self-pay

## 2024-09-02 ENCOUNTER — Other Ambulatory Visit: Payer: Self-pay | Admitting: Family Medicine

## 2024-09-02 DIAGNOSIS — R21 Rash and other nonspecific skin eruption: Secondary | ICD-10-CM

## 2024-09-02 DIAGNOSIS — M109 Gout, unspecified: Secondary | ICD-10-CM

## 2024-09-03 ENCOUNTER — Other Ambulatory Visit: Payer: Self-pay

## 2024-09-04 ENCOUNTER — Other Ambulatory Visit: Payer: Self-pay

## 2024-09-04 MED FILL — Colchicine Tab 0.6 MG: ORAL | 30 days supply | Qty: 30 | Fill #0 | Status: AC

## 2024-09-04 MED FILL — Cetirizine HCl Tab 10 MG: ORAL | 90 days supply | Qty: 90 | Fill #0 | Status: AC

## 2024-09-04 NOTE — Telephone Encounter (Signed)
 Requested Prescriptions  Pending Prescriptions Disp Refills   cetirizine  (ZYRTEC ) 10 MG tablet 90 tablet 0    Sig: Take 1 tablet (10 mg total) by mouth daily.     Ear, Nose, and Throat:  Antihistamines 2 Passed - 09/04/2024  9:30 AM      Passed - Cr in normal range and within 360 days    Creat  Date Value Ref Range Status  12/10/2023 1.24 0.70 - 1.30 mg/dL Final   Creatinine, Urine  Date Value Ref Range Status  12/10/2023 161 20 - 320 mg/dL Final         Passed - Valid encounter within last 12 months    Recent Outpatient Visits           1 month ago Well adult exam   [facility] Glenard Mire, MD   2 months ago Productive cough   [facility] Glenard Mire, MD   2 months ago Dyslipidemia associated with type 2 diabetes mellitus [facility])   [place] [facility] Glenard Mire, MD   5 months ago Diabetes mellitus with microalbuminuria [facility])   [facility] Glenard Mire, MD   8 months ago Rhus dermatitis   [facility] Glenard Mire, MD       Future Appointments             In 1 week Sowles, Krichna, MD [facility] [place]             colchicine  0.6 MG tablet 90 tablet 0    Sig: Take 1 tablet (0.6 mg total) by mouth daily at onset of pain and may repeat once in 2 hours, only for gout attacks     Endocrinology:  Gout Agents - colchicine  Passed - 09/04/2024  9:30 AM      Passed - Cr in normal range and within 360 days    Creat  Date Value Ref Range Status  12/10/2023 1.24 0.70 - 1.30 mg/dL Final   Creatinine, Urine  Date Value Ref Range Status  12/10/2023 161 20 - 320 mg/dL Final         Passed - ALT in normal range and within 360 days    ALT  Date Value Ref Range Status  12/10/2023 43 9 - 46 U/L Final         Passed - AST in normal range and within 360 days    AST  Date Value Ref Range Status   12/10/2023 25 10 - 35 U/L Final         Passed - Valid encounter within last 12 months    Recent Outpatient Visits           1 month ago Well adult exam   [facility] Mire, MD   2 months ago Productive cough   [facility], Mire, MD   2 months ago Dyslipidemia associated with type 2 diabetes mellitus [facility])   [facility] Glenard Mire, MD   5 months ago Diabetes mellitus with microalbuminuria [facility])   [facility] Glenard Mire, MD   8 months ago Rhus dermatitis   [facility] Glenard Mire, MD       Future Appointments             In 1 week Sowles, Krichna, MD [facility] [place]  Passed - CBC within normal limits and completed in the last 12 months    WBC  Date Value Ref Range Status  12/10/2023 7.6 3.8 - 10.8 Thousand/uL Final   RBC  Date Value Ref Range Status  12/10/2023 4.48 4.20 - 5.80 Million/uL Final   Hemoglobin  Date Value Ref Range Status  12/10/2023 13.3 13.2 - 17.1 g/dL Final   HCT  Date Value Ref Range Status  12/10/2023 40.7 38.5 - 50.0 % Final   MCHC  Date Value Ref Range Status  12/10/2023 32.7 32.0 - 36.0 g/dL Final    Comment:    For adults, a slight decrease in the calculated MCHC value (in the range of 30 to 32 g/dL) is most likely not clinically significant; however, it should be interpreted with caution in correlation with other red cell parameters and the patient's clinical condition.    [facility]  Date Value Ref Range Status  12/10/2023 29.7 27.0 - 33.0 pg Final   MCV  Date Value Ref Range Status  12/10/2023 90.8 80.0 - 100.0 fL Final   No results found for: PLTCOUNTKUC, LABPLAT, POCPLA RDW  Date Value Ref Range Status  12/10/2023 12.1 11.0 - 15.0 % Final

## 2024-09-06 ENCOUNTER — Other Ambulatory Visit: Payer: Self-pay

## 2024-09-15 ENCOUNTER — Ambulatory Visit: Admitting: Family Medicine

## 2024-09-15 ENCOUNTER — Other Ambulatory Visit: Payer: Self-pay

## 2024-09-15 ENCOUNTER — Other Ambulatory Visit (HOSPITAL_COMMUNITY): Payer: Self-pay

## 2024-09-15 ENCOUNTER — Encounter: Payer: Self-pay | Admitting: Family Medicine

## 2024-09-15 VITALS — BP 114/76 | HR 75 | Resp 16 | Ht 68.0 in | Wt 246.7 lb

## 2024-09-15 DIAGNOSIS — E559 Vitamin D deficiency, unspecified: Secondary | ICD-10-CM | POA: Diagnosis not present

## 2024-09-15 DIAGNOSIS — K219 Gastro-esophageal reflux disease without esophagitis: Secondary | ICD-10-CM

## 2024-09-15 DIAGNOSIS — R399 Unspecified symptoms and signs involving the genitourinary system: Secondary | ICD-10-CM

## 2024-09-15 DIAGNOSIS — G4733 Obstructive sleep apnea (adult) (pediatric): Secondary | ICD-10-CM | POA: Diagnosis not present

## 2024-09-15 DIAGNOSIS — Z7984 Long term (current) use of oral hypoglycemic drugs: Secondary | ICD-10-CM | POA: Diagnosis not present

## 2024-09-15 DIAGNOSIS — E119 Type 2 diabetes mellitus without complications: Secondary | ICD-10-CM | POA: Insufficient documentation

## 2024-09-15 DIAGNOSIS — E785 Hyperlipidemia, unspecified: Secondary | ICD-10-CM | POA: Diagnosis not present

## 2024-09-15 DIAGNOSIS — E1169 Type 2 diabetes mellitus with other specified complication: Secondary | ICD-10-CM

## 2024-09-15 DIAGNOSIS — M109 Gout, unspecified: Secondary | ICD-10-CM | POA: Insufficient documentation

## 2024-09-15 LAB — POCT URINALYSIS DIPSTICK
Bilirubin, UA: NEGATIVE
Glucose, UA: NEGATIVE
Ketones, UA: NEGATIVE
Leukocytes, UA: NEGATIVE
Nitrite, UA: NEGATIVE
Protein, UA: NEGATIVE
Spec Grav, UA: 1.01 (ref 1.010–1.025)
Urobilinogen, UA: 0.2 U/dL — AB
pH, UA: 5 (ref 5.0–8.0)

## 2024-09-15 LAB — POCT GLYCOSYLATED HEMOGLOBIN (HGB A1C): Hemoglobin A1C: 7.6 % — AB (ref 4.0–5.6)

## 2024-09-15 MED ORDER — EMPAGLIFLOZIN 25 MG PO TABS
25.0000 mg | ORAL_TABLET | Freq: Every day | ORAL | 1 refills | Status: AC
  Filled 2024-09-15: qty 90, 90d supply, fill #0

## 2024-09-15 MED ORDER — TRULICITY 4.5 MG/0.5ML ~~LOC~~ SOAJ
4.5000 mg | SUBCUTANEOUS | 1 refills | Status: AC
  Filled 2024-09-15: qty 6, 84d supply, fill #0

## 2024-09-15 NOTE — Progress Notes (Signed)
 Name: Trevor Dennis.   MRN: [id number]    DOB: 11/10/1970   Date:09/15/2024       Progress Note  Subjective  Chief Complaint  Chief Complaint  Patient presents with   Medical Management of Chronic Issues   Discussed the use of AI scribe software for clinical note transcription with the patient, who gave verbal consent to proceed.  History of Present Illness Trevor Dennis. is a 53 year old male with diabetes who presents for a follow-up visit.  He manages his diabetes with Trulicity  4.5 mg, Tresiba  8 units, Jardiance  25 mg, pioglitazone  15 mg, and metformin  twice daily. His A1c has decreased from 8 to 7.6. He craves sugary snacks, particularly peanut butter cakes.  He experiences frequent urination, approximately two times an hour, and occasionally at night. He drinks a lot of water to stay hydrated and has to 'push' to finish urinating, suggesting possible prostate enlargement. No burning sensation during urination.  He has dyslipidemia, managed with lovastatin  40 mg, with a last LDL of 68 in February. No muscle aches. He previously had hypertension but is no longer on lisinopril  as his blood pressure is well-controlled.  No current symptoms of gout, reflux, or sleep apnea. He uses a mouthpiece for sleep apnea and takes Nexium  for reflux. He has not been taking his vitamin D  supplement recently but has it available at home.  Family history includes multiple relatives with cancer, including his uncle and sister with pancreatic cancer, and his father who had lung cancer. Genetic testing indicated these were non-genetic cases, possibly environmental.  He plays disc golf regularly and has set up a course at his home for social gatherings.    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   Anxiety 01/01/2016   Elevated liver enzymes 07/27/2015   Intermittent low back pain 07/27/2015   Fatty liver 05/03/2015   Dyslipidemia associated with type 2 diabetes mellitus (HCC) 04/24/2015   HLD (hyperlipidemia) 04/24/2015   Fever blister 04/24/2015    Past Surgical History:  Procedure Laterality Date   BACK SURGERY  8 years ago    Family History  Problem Relation Age of Onset   Diabetes Mother    Cancer Father 58       lung cancer   Lung cancer Father    Liver disease Brother    Alcohol abuse Brother    ADD / ADHD Son    ADD / ADHD Son     Social History   Tobacco Use   Smoking status: Former    Current packs/day: 0.00    Types: Cigarettes    Quit date: 02/15/1990    Years since quitting: 34.6   Smokeless tobacco: Never   Tobacco comments:    Quit July 3rd 2024 when his father with lung cancer  Substance Use Topics   Alcohol use: Yes    Alcohol/week: 0.0 standard drinks of alcohol    Comment: Occasional      Current Outpatient  Medications:    allopurinol  (ZYLOPRIM ) 100 MG tablet, Take 1 tablet (100 mg total) by mouth daily., Disp: 90 tablet, Rfl: 1   ALPRAZolam  (XANAX ) 0.5 MG tablet, Take 1 tablet (0.5 mg total) by mouth daily as needed., Disp: 30 tablet, Rfl: 0   cetirizine  (ZYRTEC ) 10 MG tablet, Take 1 tablet (10 mg total) by mouth daily., Disp: 90 tablet, Rfl: 0   colchicine  0.6 MG tablet, Take 1 tablet (0.6 mg total) by mouth daily at onset of pain and may repeat once in 2 hours, only for gout attacks, Disp: 90 tablet, Rfl: 0   Dulaglutide  (TRULICITY ) 4.5 MG/0.5ML SOAJ,  Inject 4.5 mg into the skin once a week., Disp: 6 mL, Rfl: 1   empagliflozin  (JARDIANCE ) 25 MG TABS tablet, Take 1 tablet (25 mg total) by mouth daily before breakfast., Disp: 90 tablet, Rfl: 1   esomeprazole  (NEXIUM ) 40 MG capsule, Take 1 capsule (40 mg total) by mouth in the morning., Disp: 90 capsule, Rfl: 1   insulin  degludec (TRESIBA  FLEXTOUCH) 100 UNIT/ML FlexTouch Pen, Inject 10-50 Units into the skin daily., Disp: 15 mL, Rfl: 0   Insulin  Pen Needle 32G X 6 MM MISC, 1 each by Does not apply route daily., Disp: 100 each, Rfl: 0   lovastatin  (MEVACOR ) 40 MG tablet, Take 1 tablet (40 mg total) by mouth at bedtime., Disp: 90 tablet, Rfl: 1   meloxicam  (MOBIC ) 15 MG tablet, Take 1 tablet (15 mg total) by mouth daily as needed for pain. Take tylenol instead when possible, Disp: 90 tablet, Rfl: 0   metFORMIN  (GLUCOPHAGE -XR) 750 MG 24 hr tablet, Take 2 tablets (1,500 mg total) by mouth daily with breakfast., Disp: 180 tablet, Rfl: 1   Multiple Vitamin (MULTI VITAMIN DAILY PO), Take by mouth daily. , Disp: , Rfl:    pioglitazone  (ACTOS ) 15 MG tablet, Take 1 tablet (15 mg total) by mouth daily., Disp: 90 tablet, Rfl: 1   valACYclovir  (VALTREX ) 1000 MG tablet, Take 1 tablet (1,000 mg total) by mouth 2 (two) times daily., Disp: 20 tablet, Rfl: 0   Albuterol -Budesonide  (AIRSUPRA ) 90-80 MCG/ACT AERO, Inhale 2 puffs into the lungs 4 (four) times daily as needed., Disp: 10.7 g, Rfl: 0   Continuous Glucose Sensor (DEXCOM G7 SENSOR) MISC, 1 each by Does not apply route as directed., Disp: 1 each, Rfl: 5   EPINEPHrine  0.3 mg/0.3 mL IJ SOAJ injection, Inject 0.3 mg into the muscle as needed for anaphylaxis., Disp: 2 each, Rfl: 0   Fluocinonide  0.1 % CREA, Apply 1 g topically 2 (two) times daily., Disp: 120 g, Rfl: 0   hydrOXYzine  (ATARAX ) 10 MG tablet, Take 1 tablet (10 mg total) by mouth at bedtime as needed., Disp: 30 tablet, Rfl: 0  No Known Allergies  I personally reviewed active problem list, medication  list, allergies, family history with the patient/caregiver today.   ROS  Ten systems reviewed and is negative except as mentioned in HPI    Objective Physical Exam  CONSTITUTIONAL: Patient appears well-developed and well-nourished.  No distress. HEENT: Head atraumatic, normocephalic, neck supple. CARDIOVASCULAR: Normal rate, regular rhythm and normal heart sounds.  No murmur heard. No BLE edema. PULMONARY: Effort normal and breath sounds normal. No respiratory distress. ABDOMINAL: There is no tenderness or distention. MUSCULOSKELETAL: Normal gait. Without gross motor or sensory deficit. PSYCHIATRIC: Patient has a normal mood and affect. behavior is normal. Judgment and thought content normal.  Vitals:   09/15/24 0837  BP: 114/76  Pulse: 75  Resp: 16  SpO2: 97%  Weight: 246 lb 11.2 oz (111.9 kg)  Height: 5' 8 (1.727 m)    Body mass index is 37.51 kg/m.  Recent Results (from the past 2160 hours)  POCT glycosylated hemoglobin (Hb A1C)     Status: Abnormal   Collection Time: 09/15/24  8:45 AM  Result Value Ref Range   Hemoglobin A1C 7.6 (A) 4.0 - 5.6 %   HbA1c POC (<> result, manual entry)     HbA1c, POC (prediabetic range)     HbA1c, POC (controlled diabetic range)      Diabetic Foot Exam:     PHQ2/9:    09/15/2024    8:30 AM 07/29/2024    8:31 AM 07/01/2024   11:19 AM 06/15/2024    8:33 AM 01/06/2024   11:22 AM  Depression screen PHQ 2/9  Decreased Interest 0 0 0 0 0  Down, Depressed, Hopeless 0 0 0 0 0  PHQ - 2 Score 0 0 0 0 0  Altered sleeping     0  Tired, decreased energy     0  Change in appetite     0  Feeling bad or failure about yourself      0  Trouble concentrating     0  Moving slowly or fidgety/restless     0  Suicidal thoughts     0  PHQ-9 Score     0   Difficult doing work/chores     Not difficult at all     Data saved with a previous flowsheet row definition    phq 9 is negative  Fall Risk:    09/15/2024    8:29 AM 07/29/2024    8:31  AM 07/01/2024   11:19 AM 06/15/2024    8:33 AM 11/20/2023   10:27 AM  Fall Risk   Falls in the past year? 1 1 0 0 0  Number falls in past yr: 0 0 0 0 0  Injury with Fall? 1 1  0  0  0   Risk for fall due to : Impaired balance/gait Impaired balance/gait No Fall Risks No Fall Risks No Fall Risks  Follow up Falls evaluation completed Falls evaluation completed Falls evaluation completed Falls evaluation completed Falls prevention discussed;Education provided;Falls evaluation completed     Data saved with a previous flowsheet row definition      Assessment & Plan Type 2 diabetes mellitus with obesity and dyslipidemia Type 2 diabetes with improved A1c from 8.0 to 7.6. Obesity with BMI over 35. Dyslipidemia controlled with lovastatin , LDL at 68. Increased urination likely due to Jardiance . No UTI signs. Discussed dietary habits and weight management. - Continue Trulicity  4.5 mg, Tresiba  8 units, Jardiance  25 mg, pioglitazone  15 mg, metformin . - Encouraged protein-rich evening snacks to reduce sugar cravings. - Encouraged weight loss through increased physical activity, such as playing disc golf. - Sent urine for culture to rule out infection. - Consider Flomax if no infection and symptoms persist. - Avoid caffeine to help with prostate symptoms.  Dyslipidemia associated with DM type 2  Well-controlled with lovastatin . LDL at 68, no muscle aches. - Continue lovastatin  40 mg daily.  Gastroesophageal reflux disease Well-controlled with Nexium . - Continue Nexium  as prescribed.  Obstructive sleep apnea Managed with a mouthpiece, effective. - Continue using the mouthpiece.  Vitamin D  deficiency He has not been taking supplements. - Resume daily vitamin D  supplementation.  Lower urinary tract symptoms, possible prostatic enlargement Increased urination frequency and difficulty, possibly due to prostatic enlargement.  No UTI signs. Discussed potential need for prostate exam and PSA testing if  symptoms persist. - Sent urine for culture to rule out infection. - Consider prostate exam and PSA testing if symptoms persist. - Consider Flomax if no infection and symptoms persist.

## 2024-09-17 LAB — CULTURE, URINE COMPREHENSIVE
MICRO NUMBER:: 17307416
RESULT:: NO GROWTH
SPECIMEN QUALITY:: ADEQUATE

## 2024-09-20 ENCOUNTER — Ambulatory Visit: Payer: Self-pay | Admitting: Family Medicine

## 2024-09-24 LAB — OPHTHALMOLOGY REPORT-SCANNED

## 2024-10-11 ENCOUNTER — Other Ambulatory Visit: Payer: Self-pay

## 2024-10-11 ENCOUNTER — Other Ambulatory Visit: Payer: Self-pay | Admitting: Family Medicine

## 2024-10-11 DIAGNOSIS — R809 Proteinuria, unspecified: Secondary | ICD-10-CM

## 2024-10-12 ENCOUNTER — Other Ambulatory Visit: Payer: Self-pay | Admitting: Family Medicine

## 2024-10-12 ENCOUNTER — Other Ambulatory Visit: Payer: Self-pay

## 2024-10-12 DIAGNOSIS — E1129 Type 2 diabetes mellitus with other diabetic kidney complication: Secondary | ICD-10-CM

## 2024-10-13 ENCOUNTER — Other Ambulatory Visit: Payer: Self-pay

## 2024-10-13 MED FILL — Insulin Degludec Soln Pen-Injector 100 Unit/ML: SUBCUTANEOUS | 30 days supply | Qty: 15 | Fill #0 | Status: AC

## 2024-10-13 NOTE — Telephone Encounter (Signed)
 Requested Prescriptions  Pending Prescriptions Disp Refills   TRESIBA  FLEXTOUCH 100 UNIT/ML FlexTouch Pen [Pharmacy Med Name: insulin  degludec (TRESIBA  FLEXTOUCH) 100 UNIT/ML FlexTouch Pen] 15 mL 2    Sig: Inject 10-50 Units into the skin daily.     Endocrinology:  Diabetes - Insulins Passed - 10/13/2024 10:49 AM      Passed - HBA1C is between 0 and 7.9 and within 180 days    Hemoglobin A1C  Date Value Ref Range Status  09/15/2024 7.6 (A) 4.0 - 5.6 % Final  07/19/2019 8.9  Final   Hgb A1c MFr Bld  Date Value Ref Range Status  10/05/2019 8.7 (H) <5.7 % of total Hgb Final    Comment:    For someone without known diabetes, a hemoglobin A1c value of 6.5% or greater indicates that they may have  diabetes and this should be confirmed with a follow-up  test. . For someone with known diabetes, a value <7% indicates  that their diabetes is well controlled and a value  greater than or equal to 7% indicates suboptimal  control. A1c targets should be individualized based on  duration of diabetes, age, comorbid conditions, and  other considerations. . Currently, no consensus exists regarding use of hemoglobin A1c for diagnosis of diabetes for children. SABRA Amy - Valid encounter within last 6 months    Recent Outpatient Visits           4 weeks ago Dyslipidemia associated with type 2 diabetes mellitus [facility])   [place] [facility] Glenard Mire, MD   2 months ago Well adult exam   [facility] Glenard Mire, MD   3 months ago Productive cough   [facility] Spelter, Mire, MD   4 months ago Dyslipidemia associated with type 2 diabetes mellitus [facility])   [place] [facility] Sowles, Krichna, MD   7 months ago Diabetes mellitus with microalbuminuria [facility])   [facility] Sowles, Krichna, MD

## 2024-12-15 ENCOUNTER — Ambulatory Visit: Admitting: Family Medicine

## 2025-08-01 ENCOUNTER — Encounter: Admitting: Family Medicine
